# Patient Record
Sex: Female | Born: 1955 | Race: Black or African American | Hispanic: No | Smoking: Former smoker
Health system: Southern US, Community
[De-identification: ages and names within clinical notes are randomized; demographics above are authoritative.]

## PROBLEM LIST (undated history)

## (undated) DIAGNOSIS — K219 Gastro-esophageal reflux disease without esophagitis: Secondary | ICD-10-CM

## (undated) DIAGNOSIS — M1990 Unspecified osteoarthritis, unspecified site: Secondary | ICD-10-CM

## (undated) DIAGNOSIS — T7840XA Allergy, unspecified, initial encounter: Secondary | ICD-10-CM

## (undated) DIAGNOSIS — I10 Essential (primary) hypertension: Secondary | ICD-10-CM

## (undated) HISTORY — DX: Unspecified osteoarthritis, unspecified site: M19.90

## (undated) HISTORY — DX: Allergy, unspecified, initial encounter: T78.40XA

## (undated) HISTORY — DX: Gastro-esophageal reflux disease without esophagitis: K21.9

## (undated) HISTORY — PX: DENTAL SURGERY: SHX609

---

## 1998-05-21 ENCOUNTER — Other Ambulatory Visit: Admission: RE | Admit: 1998-05-21 | Discharge: 1998-05-21 | Payer: Self-pay | Admitting: Obstetrics and Gynecology

## 1999-05-28 ENCOUNTER — Other Ambulatory Visit: Admission: RE | Admit: 1999-05-28 | Discharge: 1999-05-28 | Payer: Self-pay | Admitting: Obstetrics and Gynecology

## 2000-06-08 ENCOUNTER — Other Ambulatory Visit: Admission: RE | Admit: 2000-06-08 | Discharge: 2000-06-08 | Payer: Self-pay | Admitting: Obstetrics and Gynecology

## 2001-06-27 ENCOUNTER — Other Ambulatory Visit: Admission: RE | Admit: 2001-06-27 | Discharge: 2001-06-27 | Payer: Self-pay | Admitting: Obstetrics and Gynecology

## 2002-07-20 ENCOUNTER — Other Ambulatory Visit: Admission: RE | Admit: 2002-07-20 | Discharge: 2002-07-20 | Payer: Self-pay | Admitting: Family Medicine

## 2003-08-02 ENCOUNTER — Other Ambulatory Visit: Admission: RE | Admit: 2003-08-02 | Discharge: 2003-08-02 | Payer: Self-pay | Admitting: Obstetrics and Gynecology

## 2004-08-28 ENCOUNTER — Other Ambulatory Visit: Admission: RE | Admit: 2004-08-28 | Discharge: 2004-08-28 | Payer: Self-pay | Admitting: Obstetrics and Gynecology

## 2005-09-02 ENCOUNTER — Other Ambulatory Visit: Admission: RE | Admit: 2005-09-02 | Discharge: 2005-09-02 | Payer: Self-pay | Admitting: Obstetrics and Gynecology

## 2008-07-17 ENCOUNTER — Ambulatory Visit (HOSPITAL_COMMUNITY): Admission: RE | Admit: 2008-07-17 | Discharge: 2008-07-17 | Payer: Self-pay | Admitting: Obstetrics and Gynecology

## 2009-01-20 ENCOUNTER — Emergency Department (HOSPITAL_COMMUNITY): Admission: EM | Admit: 2009-01-20 | Discharge: 2009-01-20 | Payer: Self-pay | Admitting: Emergency Medicine

## 2012-12-21 ENCOUNTER — Encounter (HOSPITAL_COMMUNITY): Payer: Self-pay | Admitting: Emergency Medicine

## 2012-12-21 ENCOUNTER — Emergency Department (HOSPITAL_COMMUNITY)
Admission: EM | Admit: 2012-12-21 | Discharge: 2012-12-21 | Disposition: A | Discharge status: Home / Self Care | Payer: Self-pay | Attending: Emergency Medicine | Admitting: Emergency Medicine

## 2012-12-21 DIAGNOSIS — S81009A Unspecified open wound, unspecified knee, initial encounter: Secondary | ICD-10-CM | POA: Insufficient documentation

## 2012-12-21 DIAGNOSIS — S81812A Laceration without foreign body, left lower leg, initial encounter: Secondary | ICD-10-CM

## 2012-12-21 DIAGNOSIS — Y9389 Activity, other specified: Secondary | ICD-10-CM | POA: Insufficient documentation

## 2012-12-21 DIAGNOSIS — Z23 Encounter for immunization: Secondary | ICD-10-CM | POA: Insufficient documentation

## 2012-12-21 DIAGNOSIS — W268XXA Contact with other sharp object(s), not elsewhere classified, initial encounter: Secondary | ICD-10-CM | POA: Insufficient documentation

## 2012-12-21 DIAGNOSIS — Y929 Unspecified place or not applicable: Secondary | ICD-10-CM | POA: Insufficient documentation

## 2012-12-21 DIAGNOSIS — I10 Essential (primary) hypertension: Secondary | ICD-10-CM | POA: Insufficient documentation

## 2012-12-21 DIAGNOSIS — F17200 Nicotine dependence, unspecified, uncomplicated: Secondary | ICD-10-CM | POA: Insufficient documentation

## 2012-12-21 DIAGNOSIS — T148XXA Other injury of unspecified body region, initial encounter: Secondary | ICD-10-CM | POA: Insufficient documentation

## 2012-12-21 HISTORY — DX: Essential (primary) hypertension: I10

## 2012-12-21 MED ORDER — IBUPROFEN 800 MG PO TABS: 800.0000 mg | ORAL_TABLET | Freq: Three times a day (TID) | ORAL | Status: DC

## 2012-12-21 MED ORDER — TETANUS-DIPHTH-ACELL PERTUSSIS 5-2.5-18.5 LF-MCG/0.5 IM SUSP
0.5000 mL | Freq: Once | INTRAMUSCULAR | Status: AC
  Administered 2012-12-21: 0.5 mL via INTRAMUSCULAR
  Filled 2012-12-21: qty 0.5

## 2012-12-21 NOTE — ED Notes (Signed)
Per pt, broke glass yesterday, was taking garbage out when trash bag rubbed up against left thigh and glass cut her

## 2012-12-21 NOTE — Progress Notes (Signed)
[facility] has seen patient and provided her with a oc application. °

## 2012-12-22 NOTE — ED Provider Notes (Signed)
History    CSN: [id number] Arrival date & time 12/21/12  0907  First MD Initiated Contact with Patient 12/21/12 [phone]     Chief Complaint  Patient presents with  . Extremity Laceration   (Consider location/radiation/quality/duration/timing/severity/associated sxs/prior Treatment) Patient is a 57 y.o. female presenting with skin laceration. The history is provided by the patient.  Laceration Location: left anterior thigh. Length (cm):  1.5 cm Quality: straight   Bleeding: controlled   Time since incident:  18 hours Laceration mechanism:  Broken glass Pain details:    Quality:  Dull   Severity:  Mild   Progression:  Improving Foreign body present:  No foreign bodies Ineffective treatments: Patient has been cleaning laceration with alcohol. Tetanus status:  Out of date  Past Medical History  Diagnosis Date  . Hypertension    History reviewed. No pertinent past surgical history. No family history on file. History  Substance Use Topics  . Smoking status: Current Every Day Smoker  . Smokeless tobacco: Not on file  . Alcohol Use: Yes   OB History   Grav Para Term Preterm Abortions TAB SAB Ect Mult Living                 Review of Systems  Skin: Positive for wound.  All other systems reviewed and are negative.    Allergies  Review of patient's allergies indicates no known allergies.  Home Medications   Current Outpatient Rx  Name  Route  Sig  Dispense  Refill  . ALPRAZolam (XANAX) 0.5 MG tablet   Oral   Take 0.5 mg by mouth every 8 (eight) hours as needed for sleep.         . Aspirin-Acetaminophen-Caffeine (GOODY HEADACHE PO)   Oral   Take 1 packet by mouth daily as needed.         Marland Kitchen ibuprofen (ADVIL,MOTRIN) 200 MG tablet   Oral   Take 400 mg by mouth every 6 (six) hours as needed for pain.         Marland Kitchen lisinopril-hydrochlorothiazide (PRINZIDE,ZESTORETIC) 20-12.5 MG per tablet   Oral   Take 1 tablet by mouth daily.         Marland Kitchen ibuprofen  (ADVIL,MOTRIN) 800 MG tablet   Oral   Take 1 tablet (800 mg total) by mouth 3 (three) times daily.   21 tablet   0    BP 154/95  Pulse 87  Temp(Src) 98.5 F (36.9 C) (Oral)  Resp 16  SpO2 100% Physical Exam  Nursing note and vitals reviewed. Constitutional: She appears well-developed and well-nourished.  HENT:  Head: Normocephalic and atraumatic.  Cardiovascular: Normal rate, regular rhythm and normal heart sounds.   Pulmonary/Chest: Effort normal and breath sounds normal.  Neurological: She is alert.  Skin: Skin is warm and dry.  1.5 cm superficial laceration of the anterior left thigh.  No active bleeding.  No drainage.  No surrounding edema or erythema.  Psychiatric: She has a normal mood and affect.    ED Course  Procedures (including critical care time) Labs Reviewed - No data to display No results found. 1. Laceration of left lower extremity, initial encounter   2. Muscle strain     MDM  Patient presenting with a laceration of the left upper thigh.  Laceration is 18 hours old and therefore, was not repaired in the ED with sutures or Dermabond.  Laceration superficial and is not bleeding.  No signs of infection at this time.  Tetanus updated.  Patient  discharged home.  Pascal Lux Crandon, PA-C 12/22/12 2352

## 2012-12-23 NOTE — ED Provider Notes (Signed)
Medical screening examination/treatment/procedure(s) were performed by non-physician practitioner and as supervising physician I was immediately available for consultation/collaboration.   Jahaziel Francois L Somaly Marteney, MD 12/23/12 0850 

## 2014-03-13 ENCOUNTER — Emergency Department (HOSPITAL_COMMUNITY)
Admission: EM | Admit: 2014-03-13 | Discharge: 2014-03-13 | Disposition: A | Discharge status: Home / Self Care | Payer: Self-pay | Attending: Emergency Medicine | Admitting: Emergency Medicine

## 2014-03-13 ENCOUNTER — Emergency Department (HOSPITAL_COMMUNITY): Payer: Self-pay

## 2014-03-13 ENCOUNTER — Encounter (HOSPITAL_COMMUNITY): Payer: Self-pay | Admitting: Emergency Medicine

## 2014-03-13 DIAGNOSIS — Z791 Long term (current) use of non-steroidal anti-inflammatories (NSAID): Secondary | ICD-10-CM | POA: Insufficient documentation

## 2014-03-13 DIAGNOSIS — M1610 Unilateral primary osteoarthritis, unspecified hip: Secondary | ICD-10-CM | POA: Insufficient documentation

## 2014-03-13 DIAGNOSIS — Z79899 Other long term (current) drug therapy: Secondary | ICD-10-CM | POA: Insufficient documentation

## 2014-03-13 DIAGNOSIS — F17200 Nicotine dependence, unspecified, uncomplicated: Secondary | ICD-10-CM | POA: Insufficient documentation

## 2014-03-13 DIAGNOSIS — M1611 Unilateral primary osteoarthritis, right hip: Secondary | ICD-10-CM

## 2014-03-13 DIAGNOSIS — I10 Essential (primary) hypertension: Secondary | ICD-10-CM | POA: Insufficient documentation

## 2014-03-13 DIAGNOSIS — M79609 Pain in unspecified limb: Secondary | ICD-10-CM | POA: Insufficient documentation

## 2014-03-13 MED ORDER — TRAMADOL HCL 50 MG PO TABS: 50.0000 mg | ORAL_TABLET | Freq: Two times a day (BID) | ORAL | Status: DC

## 2014-03-13 MED ORDER — MELOXICAM 15 MG PO TABS: 15.0000 mg | ORAL_TABLET | Freq: Every day | ORAL | Status: DC

## 2014-03-13 NOTE — Discharge Instructions (Signed)
1. Medications: mobic, tramadol, usual home medications 2. Treatment: rest, drink plenty of fluids,  3. Follow Up: Please follow-up with the [place] [facility] for further evaluation of your hip pain    Arthritis, Nonspecific Arthritis is inflammation of a joint. This usually means pain, redness, warmth or swelling are present. One or more joints may be involved. There are a number of types of arthritis. Your caregiver may not be able to tell what type of arthritis you have right away. CAUSES  The most common cause of arthritis is the wear and tear on the joint (osteoarthritis). This causes damage to the cartilage, which can break down over time. The knees, hips, back and neck are most often affected by this type of arthritis. Other types of arthritis and common causes of joint pain include:  Sprains and other injuries near the joint. Sometimes minor sprains and injuries cause pain and swelling that develop hours later.  Rheumatoid arthritis. This affects hands, feet and knees. It usually affects both sides of your body at the same time. It is often associated with chronic ailments, fever, weight loss and general weakness.  Crystal arthritis. Gout and pseudo gout can cause occasional acute severe pain, redness and swelling in the foot, ankle, or knee.  Infectious arthritis. Bacteria can get into a joint through a break in overlying skin. This can cause infection of the joint. Bacteria and viruses can also spread through the blood and affect your joints.  Drug, infectious and allergy reactions. Sometimes joints can become mildly painful and slightly swollen with these types of illnesses. SYMPTOMS   Pain is the main symptom.  Your joint or joints can also be red, swollen and warm or hot to the touch.  You may have a fever with certain types of arthritis, or even feel overall ill.  The joint with arthritis will hurt with movement. Stiffness is present with some types of  arthritis. DIAGNOSIS  Your caregiver will suspect arthritis based on your description of your symptoms and on your exam. Testing may be needed to find the type of arthritis:  Blood and sometimes urine tests.  X-ray tests and sometimes CT or MRI scans.  Removal of fluid from the joint (arthrocentesis) is done to check for bacteria, crystals or other causes. Your caregiver (or a specialist) will numb the area over the joint with a local anesthetic, and use a needle to remove joint fluid for examination. This procedure is only minimally uncomfortable.  Even with these tests, your caregiver may not be able to tell what kind of arthritis you have. Consultation with a specialist (rheumatologist) may be helpful. TREATMENT  Your caregiver will discuss with you treatment specific to your type of arthritis. If the specific type cannot be determined, then the following general recommendations may apply. Treatment of severe joint pain includes:  Rest.  Elevation.  Anti-inflammatory medication (for example, ibuprofen) may be prescribed. Avoiding activities that cause increased pain.  Only take over-the-counter or prescription medicines for pain and discomfort as recommended by your caregiver.  Cold packs over an inflamed joint may be used for 10 to 15 minutes every hour. Hot packs sometimes feel better, but do not use overnight. Do not use hot packs if you are diabetic without your caregiver's permission.  A cortisone shot into arthritic joints may help reduce pain and swelling.  Any acute arthritis that gets worse over the next 1 to 2 days needs to be looked at to be sure there is no joint  infection. Long-term arthritis treatment involves modifying activities and lifestyle to reduce joint stress jarring. This can include weight loss. Also, exercise is needed to nourish the joint cartilage and remove waste. This helps keep the muscles around the joint strong. HOME CARE INSTRUCTIONS   Do not take  aspirin to relieve pain if gout is suspected. This elevates uric acid levels.  Only take over-the-counter or prescription medicines for pain, discomfort or fever as directed by your caregiver.  Rest the joint as much as possible.  If your joint is swollen, keep it elevated.  Use crutches if the painful joint is in your leg.  Drinking plenty of fluids may help for certain types of arthritis.  Follow your caregiver's dietary instructions.  Try low-impact exercise such as:  Swimming.  Water aerobics.  Biking.  Walking.  Morning stiffness is often relieved by a warm shower.  Put your joints through regular range-of-motion. SEEK MEDICAL CARE IF:   You do not feel better in 24 hours or are getting worse.  You have side effects to medications, or are not getting better with treatment. SEEK IMMEDIATE MEDICAL CARE IF:   You have a fever.  You develop severe joint pain, swelling or redness.  Many joints are involved and become painful and swollen.  There is severe back pain and/or leg weakness.  You have loss of bowel or bladder control. Document Released: 07/23/2004 Document Revised: 09/07/2011 Document Reviewed: 08/08/2008 [facility] Patient Information 2015 [place], [place]. This information is not intended to replace advice given to you by your health care provider. Make sure you discuss any questions you have with your health care provider.

## 2014-03-13 NOTE — ED Notes (Signed)
Pt c/o right leg pain x 1 year. Pt reports the pain starts in her groin and wraps around into her posterior thigh. Pt sts she hasn't been seen for this yet but decided to come in today because it just isn't getting better and feels like she is having some swelling to posterior thigh. Nad, skin warm and dry, resp e/u. Ambulatory with no difficulties.

## 2014-03-13 NOTE — ED Provider Notes (Signed)
Medical screening examination/treatment/procedure(s) were performed by non-physician practitioner and as supervising physician I was immediately available for consultation/collaboration.   EKG Interpretation None        Ezequiel Essex, MD 03/13/14 2322

## 2014-03-13 NOTE — ED Provider Notes (Signed)
CSN: [id number]     Arrival date & time 03/13/14  1417 History   First MD Initiated Contact with Patient 03/13/14 1506     Chief Complaint  Patient presents with  . Leg Pain     (Consider location/radiation/quality/duration/timing/severity/associated sxs/prior Treatment) Patient is a 58 y.o. female presenting with leg pain. The history is provided by the patient and medical records. No language interpreter was used.  Leg Pain Associated symptoms: back pain (right low back and buttock)   Associated symptoms: no fever and no neck pain     Martha Manning is a 58 y.o. female  with a hx of HTN presents to the Emergency Department complaining of intermittent, progressively worsening right leg pain onset January 2014.  Pt reports she works out and thought she had strained a muscle, but continued to work out.  Pt c/o right groin pain that radiates several inches down her thighs.  She reports pain is worse with walking and movement. Pt describes the pain as sharp, rated at a 6/10 when it comes.  Pt has been taking Goody Powders, ibuprofen without relief.  She took some of her mother's pain medication which did help her.  Pt denies fever, chills, chest pain, SOB, numbness, weakness, dizziness.  Pt denies exogenous estrogen, recent travel, fractures or surgeries; denies hx of DVT/PE.     Past Medical History  Diagnosis Date  . Hypertension    History reviewed. No pertinent past surgical history. No family history on file. History  Substance Use Topics  . Smoking status: Current Every Day Smoker -- 0.50 packs/day    Types: Cigarettes  . Smokeless tobacco: Not on file  . Alcohol Use: Yes   OB History   Grav Para Term Preterm Abortions TAB SAB Ect Mult Living                 Review of Systems  Constitutional: Negative for fever and chills.  Respiratory: Negative for chest tightness and shortness of breath.   Cardiovascular: Negative for chest pain, palpitations and leg swelling.   Gastrointestinal: Negative for nausea and vomiting.  Musculoskeletal: Positive for arthralgias ( right hip), back pain (right low back and buttock) and joint swelling (right hip). Negative for neck pain and neck stiffness.  Skin: Negative for wound.  Neurological: Negative for numbness.  Hematological: Negative for adenopathy. Does not bruise/bleed easily.  Psychiatric/Behavioral: The patient is not nervous/anxious.   All other systems reviewed and are negative.     Allergies  Review of patient's allergies indicates no known allergies.  Home Medications   Prior to Admission medications   Medication Sig Start Date End Date Taking? Authorizing Provider  Aspirin-Acetaminophen-Caffeine (GOODY HEADACHE PO) Take 1 packet by mouth daily as needed (headache).    Yes Historical Provider, MD  ibuprofen (ADVIL,MOTRIN) 200 MG tablet Take 600 mg by mouth every 6 (six) hours as needed for headache.    Yes Historical Provider, MD  lisinopril-hydrochlorothiazide (PRINZIDE,ZESTORETIC) 20-12.5 MG per tablet Take 1 tablet by mouth daily.   Yes Historical Provider, MD  Multiple Vitamin (MULTIVITAMIN WITH MINERALS) TABS tablet Take 1 tablet by mouth daily.   Yes Historical Provider, MD  meloxicam (MOBIC) 15 MG tablet Take 1 tablet (15 mg total) by mouth daily. 03/13/14   Jillaine Waren, PA-C  traMADol (ULTRAM) 50 MG tablet Take 1 tablet (50 mg total) by mouth 2 (two) times daily. As needed for pain 03/13/14   [facility] Lurleen Soltero, PA-C   BP 150/99  Pulse 93  Temp(Src) 98.2 F (36.8 C) (Oral)  Resp 18  Ht 4\' 11"  (1.499 m)  Wt 140 lb (63.504 kg)  BMI 28.26 kg/m2  SpO2 99% Physical Exam  Nursing note and vitals reviewed. Constitutional: She appears well-developed and well-nourished. No distress.  HENT:  Head: Normocephalic and atraumatic.  Eyes: Conjunctivae are normal.  Neck: Normal range of motion.  Cardiovascular: Normal rate, regular rhythm, normal heart sounds and intact distal pulses.    Capillary refill < 3 sec  Pulmonary/Chest: Effort normal and breath sounds normal.  Musculoskeletal: She exhibits tenderness ( right buttock). She exhibits no edema.  ROM: Full ROM of the right hip with mild pain; full ROM of the left hip without pain Full ROM of T-spine and L-spine without reproduction of the pain  Neurological: She is alert. She has normal strength. Coordination normal.  Reflex Scores:      Bicep reflexes are 2+ on the right side and 2+ on the left side.      Brachioradialis reflexes are 2+ on the right side and 2+ on the left side.      Patellar reflexes are 2+ on the right side and 2+ on the left side.      Achilles reflexes are 2+ on the right side and 2+ on the left side. Sensation intact to dull and sharp Strength 5/5 in the BLE including resisted flexion and extension, abduction and adduction of the hip, knee and ankle No gait disturbance  Skin: Skin is warm and dry. She is not diaphoretic. No erythema.  No tenting of the skin  Psychiatric: She has a normal mood and affect.    ED Course  Procedures (including critical care time) Labs Review Labs Reviewed - No data to display  Imaging Review Dg Hip Complete Right  03/13/2014   CLINICAL DATA:  Right buttock, hip, and thigh pain for 20 months. Hypertension.  EXAM: RIGHT HIP - COMPLETE 2+ VIEW  COMPARISON:  None.  FINDINGS: Severe craniocaudad loss of articular space in the right hip with associated acetabular and femoral head spurring. This is asymmetric, with the left hip appearing normal. Spurring along the right femoral neck medially.  IMPRESSION: 1. Severe asymmetric osteoarthritis of the right hip, possibly due to remote trauma. This has a "bone-on-bone" appearance and the patient may be a suitable candidate for right hip replacement. The left hip appears normal.   Electronically Signed   By: Sherryl Barters M.D.   On: 03/13/2014 16:05     EKG Interpretation None      MDM   Final diagnoses:  Primary  osteoarthritis of right hip   Martha Manning presents with right hip pain for 1.5 years.  Wells DVT score -2; vision without risk factors, she is low risk I do not believe that she has a DVT. Pt Hx and physical consistent with osteoarthritis of the hip.  Will obtain x-ray.  Patient X-Ray negative for obvious fracture or dislocation, but significant osteoarthritis is suspected.. Pt advised to follow up with orthopedics for further evaluation and discussion of treatment options. Conservative therapy recommended and discussed including anti-inflammatories. Will give tramadol for pain control. Patient will be dc home & is agreeable with above plan.  BP 150/99  Pulse 93  Temp(Src) 98.2 F (36.8 C) (Oral)  Resp 18  Ht 4\' 11"  (1.499 m)  Wt 140 lb (63.504 kg)  BMI 28.26 kg/m2  SpO2 99%    Abigail Butts, PA-C 03/13/14 1633

## 2014-03-13 NOTE — ED Notes (Signed)
Declined W/C at D/C and was escorted to lobby by RN. 

## 2014-03-14 ENCOUNTER — Ambulatory Visit: Payer: Self-pay | Attending: Internal Medicine | Admitting: Internal Medicine

## 2014-03-14 ENCOUNTER — Encounter: Payer: Self-pay | Admitting: Internal Medicine

## 2014-03-14 VITALS — BP 140/90 | HR 77 | Temp 98.4°F | Resp 16 | Wt 143.8 lb

## 2014-03-14 DIAGNOSIS — F17200 Nicotine dependence, unspecified, uncomplicated: Secondary | ICD-10-CM | POA: Insufficient documentation

## 2014-03-14 DIAGNOSIS — M1610 Unilateral primary osteoarthritis, unspecified hip: Secondary | ICD-10-CM | POA: Insufficient documentation

## 2014-03-14 DIAGNOSIS — Z7982 Long term (current) use of aspirin: Secondary | ICD-10-CM | POA: Insufficient documentation

## 2014-03-14 DIAGNOSIS — I10 Essential (primary) hypertension: Secondary | ICD-10-CM | POA: Insufficient documentation

## 2014-03-14 DIAGNOSIS — M169 Osteoarthritis of hip, unspecified: Secondary | ICD-10-CM

## 2014-03-14 DIAGNOSIS — M1611 Unilateral primary osteoarthritis, right hip: Secondary | ICD-10-CM | POA: Insufficient documentation

## 2014-03-14 DIAGNOSIS — Z1211 Encounter for screening for malignant neoplasm of colon: Secondary | ICD-10-CM

## 2014-03-14 DIAGNOSIS — Z139 Encounter for screening, unspecified: Secondary | ICD-10-CM

## 2014-03-14 LAB — COMPLETE METABOLIC PANEL WITH GFR
ALK PHOS: 74 U/L (ref 39–117)
ALT: 10 U/L (ref 0–35)
AST: 15 U/L (ref 0–37)
Albumin: 4.4 g/dL (ref 3.5–5.2)
BUN: 12 mg/dL (ref 6–23)
CALCIUM: 10.4 mg/dL (ref 8.4–10.5)
CHLORIDE: 103 meq/L (ref 96–112)
CO2: 27 mEq/L (ref 19–32)
CREATININE: 0.64 mg/dL (ref 0.50–1.10)
GFR, Est African American: >89 mL/min
GFR, Est Non African American: >89 mL/min
Glucose, Bld: 83 mg/dL (ref 70–99)
POTASSIUM: 4.2 meq/L (ref 3.5–5.3)
Sodium: 138 mEq/L (ref 135–145)
Total Bilirubin: 0.3 mg/dL (ref 0.2–1.2)
Total Protein: 7.1 g/dL (ref 6.0–8.3)

## 2014-03-14 LAB — CBC WITH DIFFERENTIAL/PLATELET
BASOS ABS: 0 10*3/uL (ref 0.0–0.1)
BASOS PCT: 0 % (ref 0–1)
EOS PCT: 2 % (ref 0–5)
Eosinophils Absolute: 0.1 10*3/uL (ref 0.0–0.7)
HCT: 37.1 % (ref 36.0–46.0)
Hemoglobin: 12.3 g/dL (ref 12.0–15.0)
LYMPHS PCT: 35 % (ref 12–46)
Lymphs Abs: 2.5 10*3/uL (ref 0.7–4.0)
MCH: 25.9 pg — ABNORMAL LOW (ref 26.0–34.0)
MCHC: 33.2 g/dL (ref 30.0–36.0)
MCV: 78.3 fL (ref 78.0–100.0)
Monocytes Absolute: 0.9 10*3/uL (ref 0.1–1.0)
Monocytes Relative: 12 % (ref 3–12)
NEUTROS ABS: 3.7 10*3/uL (ref 1.7–7.7)
Neutrophils Relative %: 51 % (ref 43–77)
PLATELETS: 353 10*3/uL (ref 150–400)
RBC: 4.74 MIL/uL (ref 3.87–5.11)
RDW: 16.6 % — AB (ref 11.5–15.5)
WBC: 7.2 10*3/uL (ref 4.0–10.5)

## 2014-03-14 LAB — LIPID PANEL
CHOL/HDL RATIO: 3.1 ratio
CHOLESTEROL: 181 mg/dL (ref 0–200)
HDL: 58 mg/dL (ref >39)
LDL Cholesterol: 108 mg/dL — ABNORMAL HIGH (ref 0–99)
TRIGLYCERIDES: 75 mg/dL (ref <150)
VLDL: 15 mg/dL (ref 0–40)

## 2014-03-14 MED ORDER — LISINOPRIL-HYDROCHLOROTHIAZIDE 20-12.5 MG PO TABS: 1.0000 | ORAL_TABLET | Freq: Every day | ORAL | Status: DC

## 2014-03-14 NOTE — Progress Notes (Signed)
Patient here to establish care Was seen yesterday in the  ED for right side hip pain Was diagnosed with arthritis Today presents with elevated blood pressure Stated took her blood pressure medication this am at 8:00

## 2014-03-14 NOTE — Progress Notes (Signed)
Patient Demographics  Martha Manning, is a 58 y.o. female  IDP:[id number]  WER:[id number]  DOB - 1955-06-30  CC:  Chief Complaint  Patient presents with  . Establish Care       HPI: Martha Manning is a 58 y.o. female here today to establish medical care.Patient has history of hypertension, recently went to the emergency room with symptoms of right leg pain which is in chronic recently getting worse, EMR reviewed the pain starts from right groin radiates down to the thigh, patient had her x-ray done which reported severe asymmetric osteoarthritis of right hip. Patient was advised to osteoarthritis, she was prescribed tramadol for pain control. Today her blood pressure was elevated, repeat manual blood pressure is 140/90 as per patient she is compliant in taking her medications. Patient has No headache, No chest pain, No abdominal pain - No Nausea, No new weakness tingling or numbness, No Cough - SOB.  No Known Allergies Past Medical History  Diagnosis Date  . Hypertension   . Arthritis    Current Outpatient Prescriptions on File Prior to Visit  Medication Sig Dispense Refill  . Aspirin-Acetaminophen-Caffeine (GOODY HEADACHE PO) Take 1 packet by mouth daily as needed (headache).       Marland Kitchen ibuprofen (ADVIL,MOTRIN) 200 MG tablet Take 600 mg by mouth every 6 (six) hours as needed for headache.       . meloxicam (MOBIC) 15 MG tablet Take 1 tablet (15 mg total) by mouth daily.  30 tablet  0  . Multiple Vitamin (MULTIVITAMIN WITH MINERALS) TABS tablet Take 1 tablet by mouth daily.      . traMADol (ULTRAM) 50 MG tablet Take 1 tablet (50 mg total) by mouth 2 (two) times daily. As needed for pain  15 tablet  0   No current facility-administered medications on file prior to visit.   Family History  Problem Relation Age of Onset  . Hypertension Mother   . Stroke Mother   . Hypertension Sister    History   Social History  . Marital Status: Divorced    Spouse Name: N/A    Number  of Children: N/A  . Years of Education: N/A   Occupational History  . Not on file.   Social History Main Topics  . Smoking status: Current Every Day Smoker -- 0.50 packs/day for 40 years    Types: Cigarettes  . Smokeless tobacco: Not on file  . Alcohol Use: Yes  . Drug Use: No  . Sexual Activity: Not on file   Other Topics Concern  . Not on file   Social History Narrative  . No narrative on file    Review of Systems: Constitutional: Negative for fever, chills, diaphoresis, activity change, appetite change and fatigue. HENT: Negative for ear pain, nosebleeds, congestion, facial swelling, rhinorrhea, neck pain, neck stiffness and ear discharge.  Eyes: Negative for pain, discharge, redness, itching and visual disturbance. Respiratory: Negative for cough, choking, chest tightness, shortness of breath, wheezing and stridor.  Cardiovascular: Negative for chest pain, palpitations and leg swelling. Gastrointestinal: Negative for abdominal distention. Genitourinary: Negative for dysuria, urgency, frequency, hematuria, flank pain, decreased urine volume, difficulty urinating and dyspareunia.  Musculoskeletal: Negative for back pain, joint swelling, arthralgia and gait problem. Neurological: Negative for dizziness, tremors, seizures, syncope, facial asymmetry, speech difficulty, weakness, light-headedness, numbness and headaches.  Hematological: Negative for adenopathy. Does not bruise/bleed easily. Psychiatric/Behavioral: Negative for hallucinations, behavioral problems, confusion, dysphoric mood, decreased concentration and agitation.    Objective:   Filed  Vitals:   03/14/14 1058  BP: 140/90  Pulse:   Temp:   Resp:     Physical Exam: Constitutional: Patient appears well-developed and well-nourished. No distress. HENT: Normocephalic, atraumatic, External right and left ear normal. Oropharynx is clear and moist.  Eyes: Conjunctivae and EOM are normal. PERRLA, no scleral  icterus. Neck: Normal ROM. Neck supple. No JVD. No tracheal deviation. No thyromegaly. CVS: RRR, S1/S2 +, no murmurs, no gallops, no carotid bruit.  Pulmonary: Effort and breath sounds normal, no stridor, rhonchi, wheezes, rales.  Abdominal: Soft. BS +, no distension, tenderness, rebound or guarding.  Musculoskeletal: Normal range of motion. No edema and no tenderness.  Neuro: Alert. Normal reflexes, muscle tone coordination. No cranial nerve deficit. Skin: Skin is warm and dry. No rash noted. Not diaphoretic. No erythema. No pallor. Psychiatric: Normal mood and affect. Behavior, judgment, thought content normal.  No results found for this basename: WBC, HGB, HCT, MCV, PLT   No results found for this basename: CREATININE, BUN, NA, K, CL, CO2    No results found for this basename: HGBA1C   Lipid Panel  No results found for this basename: chol, trig, hdl, cholhdl, vldl, ldlcalc       Assessment and plan:   1. Essential hypertension, benign Blood pressure is borderline elevated, advise patient for DASH diet will repeat blood chemistry continued current meds. - COMPLETE METABOLIC PANEL WITH GFR - lisinopril-hydrochlorothiazide (PRINZIDE,ZESTORETIC) 20-12.5 MG per tablet; Take 1 tablet by mouth daily.  Dispense: 30 tablet; Refill: 3  2. Osteoarthritis of right hip, unspecified osteoarthritis type Patient asked tramadol when necessary for pain - Ambulatory referral to Orthopedic Surgery  3. Screening Ordered baseline blood work. - CBC with Differential - COMPLETE METABOLIC PANEL WITH GFR - TSH - Lipid panel - Vit D  25 hydroxy (rtn osteoporosis monitoring) - MM DIGITAL SCREENING BILATERAL; Future - Hemoglobin A1c - Ambulatory referral to Gynecology  4. Special screening for malignant neoplasms, colon  - Ambulatory referral to Gastroenterology  5. Smoking Advised patient quit smoking.     Health Maintenance -Colonoscopy: referred to GI -Pap Smear: referred to  GYN -Mammogram: ordered  Patient declines flu shot  Return in about 3 months (around 06/13/2014) for hypertension.   Lorayne Marek, MD

## 2014-03-14 NOTE — Patient Instructions (Signed)
DASH Eating Plan °DASH stands for "Dietary Approaches to Stop Hypertension." The DASH eating plan is a healthy eating plan that has been shown to reduce high blood pressure (hypertension). Additional health benefits may include reducing the risk of type 2 diabetes mellitus, heart disease, and stroke. The DASH eating plan may also help with weight loss. °WHAT DO I NEED TO KNOW ABOUT THE DASH EATING PLAN? °For the DASH eating plan, you will follow these general guidelines: °· Choose foods with a percent daily value for sodium of less than 5% (as listed on the food label). °· Use salt-free seasonings or herbs instead of table salt or sea salt. °· Check with your health care provider or pharmacist before using salt substitutes. °· Eat lower-sodium products, often labeled as "lower sodium" or "no salt added." °· Eat fresh foods. °· Eat more vegetables, fruits, and low-fat dairy products. °· Choose whole grains. Look for the word "whole" as the first word in the ingredient list. °· Choose fish and skinless chicken or turkey more often than red meat. Limit fish, poultry, and meat to 6 oz (170 g) each day. °· Limit sweets, desserts, sugars, and sugary drinks. °· Choose heart-healthy fats. °· Limit cheese to 1 oz (28 g) per day. °· Eat more home-cooked food and less restaurant, buffet, and fast food. °· Limit fried foods. °· Cook foods using methods other than frying. °· Limit canned vegetables. If you do use them, rinse them well to decrease the sodium. °· When eating at a restaurant, ask that your food be prepared with less salt, or no salt if possible. °WHAT FOODS CAN I EAT? °Seek help from a dietitian for individual calorie needs. °Grains °Whole grain or whole wheat bread. Brown rice. Whole grain or whole wheat pasta. Quinoa, bulgur, and whole grain cereals. Low-sodium cereals. Corn or whole wheat flour tortillas. Whole grain cornbread. Whole grain crackers. Low-sodium crackers. °Vegetables °Fresh or frozen vegetables  (raw, steamed, roasted, or grilled). Low-sodium or reduced-sodium tomato and vegetable juices. Low-sodium or reduced-sodium tomato sauce and paste. Low-sodium or reduced-sodium canned vegetables.  °Fruits °All fresh, canned (in natural juice), or frozen fruits. °Meat and Other Protein Products °Ground beef (85% or leaner), grass-fed beef, or beef trimmed of fat. Skinless chicken or turkey. Ground chicken or turkey. Pork trimmed of fat. All fish and seafood. Eggs. Dried beans, peas, or lentils. Unsalted nuts and seeds. Unsalted canned beans. °Dairy °Low-fat dairy products, such as skim or 1% milk, 2% or reduced-fat cheeses, low-fat ricotta or cottage cheese, or plain low-fat yogurt. Low-sodium or reduced-sodium cheeses. °Fats and Oils °Tub margarines without trans fats. Light or reduced-fat mayonnaise and salad dressings (reduced sodium). Avocado. Safflower, olive, or canola oils. Natural peanut or almond butter. °Other °Unsalted popcorn and pretzels. °The items listed above may not be a complete list of recommended foods or beverages. Contact your dietitian for more options. °WHAT FOODS ARE NOT RECOMMENDED? °Grains °White bread. White pasta. White rice. Refined cornbread. Bagels and croissants. Crackers that contain trans fat. °Vegetables °Creamed or fried vegetables. Vegetables in a cheese sauce. Regular canned vegetables. Regular canned tomato sauce and paste. Regular tomato and vegetable juices. °Fruits °Dried fruits. Canned fruit in light or heavy syrup. Fruit juice. °Meat and Other Protein Products °Fatty cuts of meat. Ribs, chicken wings, bacon, sausage, bologna, salami, chitterlings, fatback, hot dogs, bratwurst, and packaged luncheon meats. Salted nuts and seeds. Canned beans with salt. °Dairy °Whole or 2% milk, cream, half-and-half, and cream cheese. Whole-fat or sweetened yogurt. Full-fat   cheeses or blue cheese. Nondairy creamers and whipped toppings. Processed cheese, cheese spreads, or cheese  curds. °Condiments °Onion and garlic salt, seasoned salt, table salt, and sea salt. Canned and packaged gravies. Worcestershire sauce. Tartar sauce. Barbecue sauce. Teriyaki sauce. Soy sauce, including reduced sodium. Steak sauce. Fish sauce. Oyster sauce. Cocktail sauce. Horseradish. Ketchup and mustard. Meat flavorings and tenderizers. Bouillon cubes. Hot sauce. Tabasco sauce. Marinades. Taco seasonings. Relishes. °Fats and Oils °Butter, stick margarine, lard, shortening, ghee, and bacon fat. Coconut, palm kernel, or palm oils. Regular salad dressings. °Other °Pickles and olives. Salted popcorn and pretzels. °The items listed above may not be a complete list of foods and beverages to avoid. Contact your dietitian for more information. °WHERE CAN I FIND MORE INFORMATION? °National [employer], [employer], [employer]: www.nhlbi.nih.gov/health/health-topics/topics/dash/ °Document Released: 06/04/2011 Document Revised: 10/30/2013 Document Reviewed: 04/19/2013 °ExitCare® Patient Information ©2015 [employer]. This information is not intended to replace advice given to you by your health care provider. Make sure you discuss any questions you have with your health care provider. ° °

## 2014-03-15 LAB — TSH: TSH: 1.249 u[IU]/mL (ref 0.350–4.500)

## 2014-03-15 LAB — VITAMIN D 25 HYDROXY (VIT D DEFICIENCY, FRACTURES): Vit D, 25-Hydroxy: 59 ng/mL (ref 30–89)

## 2014-03-15 LAB — HEMOGLOBIN A1C
HEMOGLOBIN A1C: 5.9 % — AB (ref <5.7)
Mean Plasma Glucose: 123 mg/dL — ABNORMAL HIGH (ref <117)

## 2014-03-16 ENCOUNTER — Telehealth: Payer: Self-pay | Admitting: *Deleted

## 2014-03-16 NOTE — Telephone Encounter (Signed)
Pt is aware of her lab results.  

## 2014-03-16 NOTE — Telephone Encounter (Signed)
Message copied by Joan Mayans on Fri Mar 16, 2014 12:18 PM ------      Message from: Lorayne Marek      Created: Thu Mar 15, 2014 11:53 AM       Blood work reviewed noticed impaired fasting glucose, call and advise patient for low carbohydrate diet.       ------

## 2014-03-22 ENCOUNTER — Ambulatory Visit: Payer: Self-pay | Admitting: Sports Medicine

## 2014-04-05 ENCOUNTER — Ambulatory Visit: Payer: Self-pay

## 2014-04-30 ENCOUNTER — Ambulatory Visit: Payer: Self-pay

## 2014-05-03 ENCOUNTER — Ambulatory Visit: Payer: Self-pay

## 2014-05-21 ENCOUNTER — Ambulatory Visit: Payer: Self-pay

## 2014-06-07 ENCOUNTER — Ambulatory Visit: Payer: Self-pay

## 2014-06-08 ENCOUNTER — Ambulatory Visit: Payer: Self-pay | Attending: Internal Medicine

## 2014-06-14 ENCOUNTER — Other Ambulatory Visit (HOSPITAL_COMMUNITY)
Admission: RE | Admit: 2014-06-14 | Discharge: 2014-06-14 | Disposition: A | Discharge status: Home / Self Care | Payer: Self-pay | Source: Ambulatory Visit | Attending: Family Medicine | Admitting: Family Medicine

## 2014-06-14 ENCOUNTER — Ambulatory Visit (INDEPENDENT_AMBULATORY_CARE_PROVIDER_SITE_OTHER): Payer: Self-pay | Admitting: Family Medicine

## 2014-06-14 VITALS — BP 141/93 | HR 82 | Temp 98.0°F | Ht 59.0 in | Wt 141.9 lb

## 2014-06-14 DIAGNOSIS — Z124 Encounter for screening for malignant neoplasm of cervix: Secondary | ICD-10-CM

## 2014-06-14 DIAGNOSIS — Z113 Encounter for screening for infections with a predominantly sexual mode of transmission: Secondary | ICD-10-CM | POA: Insufficient documentation

## 2014-06-14 DIAGNOSIS — Z01419 Encounter for gynecological examination (general) (routine) without abnormal findings: Secondary | ICD-10-CM | POA: Insufficient documentation

## 2014-06-14 DIAGNOSIS — Z1151 Encounter for screening for human papillomavirus (HPV): Secondary | ICD-10-CM | POA: Insufficient documentation

## 2014-06-14 MED ORDER — NYSTATIN 100000 UNIT/GM EX CREA: 1.0000 "application " | TOPICAL_CREAM | Freq: Two times a day (BID) | CUTANEOUS | Status: DC

## 2014-06-14 NOTE — Addendum Note (Signed)
Addended by: [place] Pink on: 06/14/2014 01:32 PM   Modules accepted: Orders

## 2014-06-14 NOTE — Patient Instructions (Signed)
Pap Test A Pap test checks the cells on the surface of your cervix. Your doctor will look for cell changes that are not normal, an infection, or cancer. If the cells no longer look normal, it is called dysplasia. Dysplasia can turn into cancer. Regular Pap tests are important to stop cancer from developing. BEFORE THE PROCEDURE  Ask your doctor when to schedule your Pap test. Timing the test around your period may be important.  Do not douche or have sex (intercourse) for 24 hours before the test.  Do not put creams on your vagina or use tampons for 24 hours before the test.  Go pee (urinate) just before the test. PROCEDURE  You will lie on an exam table with your feet in stirrups.  A warm metal or plastic tool (speculum) will be put in your vagina to open it up.  Your doctor will use a small, plastic brush or wooden spatula to take cells from your cervix.  The cells will be put in a lab container.  The cells will be checked under a microscope to see if they are normal or not. AFTER THE PROCEDURE Get your test results. If they are abnormal, you may need more tests. Document Released: 07/18/2010 Document Revised: 09/07/2011 Document Reviewed: 06/11/2011 ExitCare Patient Information 2015 [employer]. This information is not intended to replace advice given to you by your health care provider. Make sure you discuss any questions you have with your health care provider.  

## 2014-06-14 NOTE — Progress Notes (Signed)
Patient ID: AUDRI KOZUB, female   DOB: 05-Sep-1955, 58 y.o.   MRN: [id number] Subjective:     PARRIE RASCO is a 58 y.o. woman who comes in today for a  pap smear only. Her most recent annual exam was on 1 yr ago but PAP was 3 yrs ago. Her most recent Pap smear was on 2012 and showed no abnormalities. Previous abnormal Pap smears: no. Contraception: postmenopausal  The following portions of the patient's history were reviewed and updated as appropriate: allergies, current medications, past family history, past medical history, past social history, past surgical history and problem list.  Review of Systems Pertinent items are noted in HPI.   Objective:    BP 141/93 mmHg  Pulse 82  Temp(Src) 98 F (36.7 C) (Oral)  Ht 4\' 11"  (1.499 m)  Wt 141 lb 14.4 oz (64.365 kg)  BMI 28.64 kg/m2 Pelvic Exam: cervix normal in appearance, external genitalia normal and vagina normal without discharge. Pap smear obtained.   Assessment:    Screening pap smear.   Plan:    Follow up in 3 year, or as indicated by Pap results.

## 2014-06-15 LAB — CERVICOVAGINAL ANCILLARY ONLY
Chlamydia: NEGATIVE
NEISSERIA GONORRHEA: NEGATIVE

## 2014-06-15 LAB — CYTOLOGY - PAP

## 2014-06-18 ENCOUNTER — Encounter: Payer: Self-pay | Admitting: Family Medicine

## 2014-06-19 ENCOUNTER — Telehealth: Payer: Self-pay | Admitting: Family Medicine

## 2014-06-19 NOTE — Telephone Encounter (Signed)
PAP result discussed with patient, transition zone not seen due to cervical atrophy which is normal postmenopausal. PAP otherwise normal. Repeat in 3 yrs.

## 2014-07-05 ENCOUNTER — Telehealth: Payer: Self-pay | Admitting: Internal Medicine

## 2014-07-12 ENCOUNTER — Telehealth: Payer: Self-pay | Admitting: Internal Medicine

## 2014-07-12 ENCOUNTER — Telehealth: Payer: Self-pay | Admitting: *Deleted

## 2014-07-12 ENCOUNTER — Ambulatory Visit (HOSPITAL_COMMUNITY)
Admission: RE | Admit: 2014-07-12 | Discharge: 2014-07-12 | Disposition: A | Discharge status: Home / Self Care | Payer: Self-pay | Source: Ambulatory Visit | Attending: Internal Medicine | Admitting: Internal Medicine

## 2014-07-12 DIAGNOSIS — Z139 Encounter for screening, unspecified: Secondary | ICD-10-CM

## 2014-07-12 DIAGNOSIS — Z1231 Encounter for screening mammogram for malignant neoplasm of breast: Secondary | ICD-10-CM | POA: Insufficient documentation

## 2014-07-12 NOTE — Telephone Encounter (Signed)
Patient has come into the clinic today to request a medication refill for meloxicam (MOBIC) 15 MG tablet; Patient says the script helped her condition out a lot and would like to continue usage; please f/u with patient about this request; [facility] [address];

## 2014-07-16 ENCOUNTER — Other Ambulatory Visit: Payer: Self-pay | Admitting: *Deleted

## 2014-07-16 MED ORDER — MELOXICAM 15 MG PO TABS: 15.0000 mg | ORAL_TABLET | Freq: Every day | ORAL | Status: DC

## 2014-07-16 NOTE — Telephone Encounter (Signed)
Its okay patient can be given refill on the medication.Marland Kitchen

## 2014-08-23 ENCOUNTER — Telehealth: Payer: Self-pay | Admitting: *Deleted

## 2014-08-23 ENCOUNTER — Other Ambulatory Visit: Payer: Self-pay | Admitting: *Deleted

## 2014-08-23 DIAGNOSIS — I10 Essential (primary) hypertension: Secondary | ICD-10-CM

## 2014-08-23 MED ORDER — LISINOPRIL-HYDROCHLOROTHIAZIDE 20-12.5 MG PO TABS: 1.0000 | ORAL_TABLET | Freq: Every day | ORAL | Status: DC

## 2014-08-23 MED ORDER — MELOXICAM 15 MG PO TABS: 15.0000 mg | ORAL_TABLET | Freq: Every day | ORAL | Status: DC

## 2014-08-23 NOTE — Telephone Encounter (Signed)
Pt requesting refill Rx Mobic and Lisinopril Refills send to [facility] Pt advised to schedule F/U appointment with PCP for futures refills

## 2014-09-03 ENCOUNTER — Ambulatory Visit: Payer: Self-pay | Attending: Internal Medicine | Admitting: Internal Medicine

## 2014-09-03 ENCOUNTER — Encounter: Payer: Self-pay | Admitting: Internal Medicine

## 2014-09-03 VITALS — BP 140/90 | HR 82 | Temp 98.0°F | Resp 16 | Wt 145.6 lb

## 2014-09-03 DIAGNOSIS — R7301 Impaired fasting glucose: Secondary | ICD-10-CM | POA: Insufficient documentation

## 2014-09-03 DIAGNOSIS — Z720 Tobacco use: Secondary | ICD-10-CM | POA: Insufficient documentation

## 2014-09-03 DIAGNOSIS — I10 Essential (primary) hypertension: Secondary | ICD-10-CM | POA: Insufficient documentation

## 2014-09-03 DIAGNOSIS — F17200 Nicotine dependence, unspecified, uncomplicated: Secondary | ICD-10-CM

## 2014-09-03 DIAGNOSIS — M1611 Unilateral primary osteoarthritis, right hip: Secondary | ICD-10-CM | POA: Insufficient documentation

## 2014-09-03 LAB — COMPLETE METABOLIC PANEL WITH GFR
ALBUMIN: 4.1 g/dL (ref 3.5–5.2)
ALK PHOS: 75 U/L (ref 39–117)
ALT: 9 U/L (ref 0–35)
AST: 12 U/L (ref 0–37)
BILIRUBIN TOTAL: 0.5 mg/dL (ref 0.2–1.2)
BUN: 12 mg/dL (ref 6–23)
CO2: 28 mEq/L (ref 19–32)
Calcium: 10.1 mg/dL (ref 8.4–10.5)
Chloride: 101 mEq/L (ref 96–112)
Creat: 0.65 mg/dL (ref 0.50–1.10)
GFR, Est African American: >89 mL/min
GLUCOSE: 89 mg/dL (ref 70–99)
POTASSIUM: 4.2 meq/L (ref 3.5–5.3)
SODIUM: 138 meq/L (ref 135–145)
TOTAL PROTEIN: 6.7 g/dL (ref 6.0–8.3)

## 2014-09-03 MED ORDER — LISINOPRIL-HYDROCHLOROTHIAZIDE 20-12.5 MG PO TABS: 1.0000 | ORAL_TABLET | Freq: Every day | ORAL | Status: DC

## 2014-09-03 MED ORDER — MELOXICAM 15 MG PO TABS: 15.0000 mg | ORAL_TABLET | Freq: Every day | ORAL | Status: DC

## 2014-09-03 NOTE — Progress Notes (Signed)
Patient here for follow up on her HTN Presents in office with elevated blood pressure Patient requesting a referral to ortho

## 2014-09-03 NOTE — Progress Notes (Signed)
MRN: [id number] Name: Martha Manning  Sex: female Age: 59 y.o. DOB: 01/05/1956  Allergies: Review of patient's allergies indicates no known allergies.  Chief Complaint  Patient presents with  . Follow-up    HPI: Patient is 59 y.o. female who history of hypertension, osteoarthritis of right hip, comes today for followup, initially her blood pressure was elevated, repeat manual blood pressure is 140/90, she denies any headache dizziness chest and shortness of breath, she is requesting another referral to see orthopedics for her right hip osteoarthritis, she's requesting refill on her medications.patient is to smoke cigarettes, I have counseled patient to quit smoking.patient had a blood work done which was reviewed with the patient noticed impaired fasting glucose with hemoglobin A1c of 5.9%.  Past Medical History  Diagnosis Date  . Hypertension   . Arthritis     History reviewed. No pertinent past surgical history.    Medication List       This list is accurate as of: 09/03/14 12:51 PM.  Always use your most recent med list.               GOODY HEADACHE PO  Take 1 packet by mouth daily as needed (headache).     ibuprofen 200 MG tablet  Commonly known as:  ADVIL,MOTRIN  Take 600 mg by mouth every 6 (six) hours as needed for headache.     lisinopril-hydrochlorothiazide 20-12.5 MG per tablet  Commonly known as:  PRINZIDE,ZESTORETIC  Take 1 tablet by mouth daily.     meloxicam 15 MG tablet  Commonly known as:  MOBIC  Take 1 tablet (15 mg total) by mouth daily.     multivitamin with minerals Tabs tablet  Take 1 tablet by mouth daily.     nystatin cream  Commonly known as:  MYCOSTATIN  Apply 1 application topically 2 (two) times daily.     traMADol 50 MG tablet  Commonly known as:  ULTRAM  Take 1 tablet (50 mg total) by mouth 2 (two) times daily. As needed for pain        Meds ordered this encounter  Medications  . lisinopril-hydrochlorothiazide  (PRINZIDE,ZESTORETIC) 20-12.5 MG per tablet    Sig: Take 1 tablet by mouth daily.    Dispense:  30 tablet    Refill:  3  . meloxicam (MOBIC) 15 MG tablet    Sig: Take 1 tablet (15 mg total) by mouth daily.    Dispense:  30 tablet    Refill:  2    Immunization History  Administered Date(s) Administered  . Tdap 12/21/2012    Family History  Problem Relation Age of Onset  . Hypertension Mother   . Stroke Mother   . Hypertension Sister     History  Substance Use Topics  . Smoking status: Current Every Day Smoker -- 0.50 packs/day for 40 years    Types: Cigarettes  . Smokeless tobacco: Not on file  . Alcohol Use: Yes    Review of Systems   As noted in HPI  Filed Vitals:   09/03/14 1135  BP: 140/90  Pulse:   Temp:   Resp:     Physical Exam  Physical Exam  Constitutional: No distress.  Eyes: EOM are normal. Pupils are equal, round, and reactive to light.  Cardiovascular: Normal rate and regular rhythm.   Pulmonary/Chest: Breath sounds normal. She has no wheezes. She has no rales.  Musculoskeletal: She exhibits no edema.    CBC    Component Value Date/Time  WBC 7.2 03/14/2014 1102   RBC 4.74 03/14/2014 1102   HGB 12.3 03/14/2014 1102   HCT 37.1 03/14/2014 1102   PLT 353 03/14/2014 1102   MCV 78.3 03/14/2014 1102   LYMPHSABS 2.5 03/14/2014 1102   MONOABS 0.9 03/14/2014 1102   EOSABS 0.1 03/14/2014 1102   BASOSABS 0.0 03/14/2014 1102    CMP     Component Value Date/Time   NA 138 03/14/2014 1102   K 4.2 03/14/2014 1102   CL 103 03/14/2014 1102   CO2 27 03/14/2014 1102   GLUCOSE 83 03/14/2014 1102   BUN 12 03/14/2014 1102   CREATININE 0.64 03/14/2014 1102   CALCIUM 10.4 03/14/2014 1102   PROT 7.1 03/14/2014 1102   ALBUMIN 4.4 03/14/2014 1102   AST 15 03/14/2014 1102   ALT 10 03/14/2014 1102   ALKPHOS 74 03/14/2014 1102   BILITOT 0.3 03/14/2014 1102   GFRNONAA >89 03/14/2014 1102   GFRAA >89 03/14/2014 1102    Lab Results  Component Value  Date/Time   CHOL 181 03/14/2014 11:02 AM    No components found for: HGA1C  Lab Results  Component Value Date/Time   AST 15 03/14/2014 11:02 AM    Assessment and Plan  Essential hypertension, benign - Plan:blood pressure is borderline elevated, I have advised patient for DASH diet, continue with  lisinopril-hydrochlorothiazide (PRINZIDE,ZESTORETIC) 20-12.5 MG per tablet, COMPLETE METABOLIC PANEL WITH GFR  Osteoarthritis of right hip, unspecified osteoarthritis type - Plan: Ambulatory referral to Orthopedic Surgery, meloxicam (MOBIC) 15 MG tablet  Smoking Counseled patient to quit smoking.  IFG (impaired fasting glucose) - Plan:advised patient for low carbohydrate. COMPLETE METABOLIC PANEL WITH GFR    Return in about 3 months (around 12/04/2014) for hypertension.   This note has been created with Surveyor, quantity. Any transcriptional errors are unintentional.    Lorayne Marek, MD

## 2014-09-03 NOTE — Patient Instructions (Addendum)
DASH Eating Plan DASH stands for "Dietary Approaches to Stop Hypertension." The DASH eating plan is a healthy eating plan that has been shown to reduce high blood pressure (hypertension). Additional health benefits may include reducing the risk of type 2 diabetes mellitus, heart disease, and stroke. The DASH eating plan may also help with weight loss. WHAT DO I NEED TO KNOW ABOUT THE DASH EATING PLAN? For the DASH eating plan, you will follow these general guidelines:  Choose foods with a percent daily value for sodium of less than 5% (as listed on the food label).  Use salt-free seasonings or herbs instead of table salt or sea salt.  Check with your health care provider or pharmacist before using salt substitutes.  Eat lower-sodium products, often labeled as "lower sodium" or "no salt added."  Eat fresh foods.  Eat more vegetables, fruits, and low-fat dairy products.  Choose whole grains. Look for the word "whole" as the first word in the ingredient list.  Choose fish and skinless chicken or turkey more often than red meat. Limit fish, poultry, and meat to 6 oz (170 g) each day.  Limit sweets, desserts, sugars, and sugary drinks.  Choose heart-healthy fats.  Limit cheese to 1 oz (28 g) per day.  Eat more home-cooked food and less restaurant, buffet, and fast food.  Limit fried foods.  Cook foods using methods other than frying.  Limit canned vegetables. If you do use them, rinse them well to decrease the sodium.  When eating at a restaurant, ask that your food be prepared with less salt, or no salt if possible. WHAT FOODS CAN I EAT? Seek help from a dietitian for individual calorie needs. Grains Whole grain or whole wheat bread. Brown rice. Whole grain or whole wheat pasta. Quinoa, bulgur, and whole grain cereals. Low-sodium cereals. Corn or whole wheat flour tortillas. Whole grain cornbread. Whole grain crackers. Low-sodium crackers. Vegetables Fresh or frozen vegetables  (raw, steamed, roasted, or grilled). Low-sodium or reduced-sodium tomato and vegetable juices. Low-sodium or reduced-sodium tomato sauce and paste. Low-sodium or reduced-sodium canned vegetables.  Fruits All fresh, canned (in natural juice), or frozen fruits. Meat and Other Protein Products Ground beef (85% or leaner), grass-fed beef, or beef trimmed of fat. Skinless chicken or turkey. Ground chicken or turkey. Pork trimmed of fat. All fish and seafood. Eggs. Dried beans, peas, or lentils. Unsalted nuts and seeds. Unsalted canned beans. Dairy Low-fat dairy products, such as skim or 1% milk, 2% or reduced-fat cheeses, low-fat ricotta or cottage cheese, or plain low-fat yogurt. Low-sodium or reduced-sodium cheeses. Fats and Oils Tub margarines without trans fats. Light or reduced-fat mayonnaise and salad dressings (reduced sodium). Avocado. Safflower, olive, or canola oils. Natural peanut or almond butter. Other Unsalted popcorn and pretzels. The items listed above may not be a complete list of recommended foods or beverages. Contact your dietitian for more options. WHAT FOODS ARE NOT RECOMMENDED? Grains White bread. White pasta. White rice. Refined cornbread. Bagels and croissants. Crackers that contain trans fat. Vegetables Creamed or fried vegetables. Vegetables in a cheese sauce. Regular canned vegetables. Regular canned tomato sauce and paste. Regular tomato and vegetable juices. Fruits Dried fruits. Canned fruit in light or heavy syrup. Fruit juice. Meat and Other Protein Products Fatty cuts of meat. Ribs, chicken wings, bacon, sausage, bologna, salami, chitterlings, fatback, hot dogs, bratwurst, and packaged luncheon meats. Salted nuts and seeds. Canned beans with salt. Dairy Whole or 2% milk, cream, half-and-half, and cream cheese. Whole-fat or sweetened yogurt. Full-fat   cheeses or blue cheese. Nondairy creamers and whipped toppings. Processed cheese, cheese spreads, or cheese  curds. Condiments Onion and garlic salt, seasoned salt, table salt, and sea salt. Canned and packaged gravies. Worcestershire sauce. Tartar sauce. Barbecue sauce. Teriyaki sauce. Soy sauce, including reduced sodium. Steak sauce. Fish sauce. Oyster sauce. Cocktail sauce. Horseradish. Ketchup and mustard. Meat flavorings and tenderizers. Bouillon cubes. Hot sauce. Tabasco sauce. Marinades. Taco seasonings. Relishes. Fats and Oils Butter, stick margarine, lard, shortening, ghee, and bacon fat. Coconut, palm kernel, or palm oils. Regular salad dressings. Other Pickles and olives. Salted popcorn and pretzels. The items listed above may not be a complete list of foods and beverages to avoid. Contact your dietitian for more information. WHERE CAN I FIND MORE INFORMATION? National Heart, Lung, and Blood Institute: www.nhlbi.nih.gov/health/health-topics/topics/dash/ Document Released: 06/04/2011 Document Revised: 10/30/2013 Document Reviewed: 04/19/2013 ExitCare Patient Information 2015 Exit[employer]. This information is not intended to replace advice given to you by your health care provider. Make sure you discuss any questions you have with your health care provider. Smoking Cessation Quitting smoking is important to your health and has many advantages. However, it is not always easy to quit since nicotine is a very addictive drug. Oftentimes, people try 3 times or more before being able to quit. This document explains the best ways for you to prepare to quit smoking. Quitting takes hard work and a lot of effort, but you can do it. ADVANTAGES OF QUITTING SMOKING  You will live longer, feel better, and live better.  Your body will feel the impact of quitting smoking almost immediately.  Within 20 minutes, blood pressure decreases. Your pulse returns to its normal level.  After 8 hours, carbon monoxide levels in the blood return to normal. Your oxygen level increases.  After 24 hours, the chance of  having a heart attack starts to decrease. Your breath, hair, and body stop smelling like smoke.  After 48 hours, damaged nerve endings begin to recover. Your sense of taste and smell improve.  After 72 hours, the body is virtually free of nicotine. Your bronchial tubes relax and breathing becomes easier.  After 2 to 12 weeks, lungs can hold more air. Exercise becomes easier and circulation improves.  The risk of having a heart attack, stroke, cancer, or lung disease is greatly reduced.  After 1 year, the risk of coronary heart disease is cut in half.  After 5 years, the risk of stroke falls to the same as a nonsmoker.  After 10 years, the risk of lung cancer is cut in half and the risk of other cancers decreases significantly.  After 15 years, the risk of coronary heart disease drops, usually to the level of a nonsmoker.  If you are pregnant, quitting smoking will improve your chances of having a healthy baby.  The people you live with, especially any children, will be healthier.  You will have extra money to spend on things other than cigarettes. QUESTIONS TO THINK ABOUT BEFORE ATTEMPTING TO QUIT You may want to talk about your answers with your health care provider.  Why do you want to quit?  If you tried to quit in the past, what helped and what did not?  What will be the most difficult situations for you after you quit? How will you plan to handle them?  Who can help you through the tough times? Your family? Friends? A health care provider?  What pleasures do you get from smoking? What ways can you still get pleasure   if you quit? Here are some questions to ask your health care provider:  How can you help me to be successful at quitting?  What medicine do you think would be best for me and how should I take it?  What should I do if I need more help?  What is smoking withdrawal like? How can I get information on withdrawal? GET READY  Set a quit date.  Change your  environment by getting rid of all cigarettes, ashtrays, matches, and lighters in your home, car, or work. Do not let people smoke in your home.  Review your past attempts to quit. Think about what worked and what did not. GET SUPPORT AND ENCOURAGEMENT You have a better chance of being successful if you have help. You can get support in many ways.  Tell your family, friends, and coworkers that you are going to quit and need their support. Ask them not to smoke around you.  Get individual, group, or telephone counseling and support. Programs are available at local hospitals and health centers. Call your local health department for information about programs in your area.  Spiritual beliefs and practices may help some smokers quit.  Download a "quit meter" on your computer to keep track of quit statistics, such as how long you have gone without smoking, cigarettes not smoked, and money saved.  Get a self-help book about quitting smoking and staying off tobacco. LEARN NEW SKILLS AND BEHAVIORS  Distract yourself from urges to smoke. Talk to someone, go for a walk, or occupy your time with a task.  Change your normal routine. Take a different route to work. Drink tea instead of coffee. Eat breakfast in a different place.  Reduce your stress. Take a hot bath, exercise, or read a book.  Plan something enjoyable to do every day. Reward yourself for not smoking.  Explore interactive web-based programs that specialize in helping you quit. GET MEDICINE AND USE IT CORRECTLY Medicines can help you stop smoking and decrease the urge to smoke. Combining medicine with the above behavioral methods and support can greatly increase your chances of successfully quitting smoking.  Nicotine replacement therapy helps deliver nicotine to your body without the negative effects and risks of smoking. Nicotine replacement therapy includes nicotine gum, lozenges, inhalers, nasal sprays, and skin patches. Some may be  available over-the-counter and others require a prescription.  Antidepressant medicine helps people abstain from smoking, but how this works is unknown. This medicine is available by prescription.  Nicotinic receptor partial agonist medicine simulates the effect of nicotine in your brain. This medicine is available by prescription. Ask your health care provider for advice about which medicines to use and how to use them based on your health history. Your health care provider will tell you what side effects to look out for if you choose to be on a medicine or therapy. Carefully read the information on the package. Do not use any other product containing nicotine while using a nicotine replacement product.  RELAPSE OR DIFFICULT SITUATIONS Most relapses occur within the first 3 months after quitting. Do not be discouraged if you start smoking again. Remember, most people try several times before finally quitting. You may have symptoms of withdrawal because your body is used to nicotine. You may crave cigarettes, be irritable, feel very hungry, cough often, get headaches, or have difficulty concentrating. The withdrawal symptoms are only temporary. They are strongest when you first quit, but they will go away within 10-14 days. To reduce the   chances of relapse, try to:  Avoid drinking alcohol. Drinking lowers your chances of successfully quitting.  Reduce the amount of caffeine you consume. Once you quit smoking, the amount of caffeine in your body increases and can give you symptoms, such as a rapid heartbeat, sweating, and anxiety.  Avoid smokers because they can make you want to smoke.  Do not let weight gain distract you. Many smokers will gain weight when they quit, usually less than 10 pounds. Eat a healthy diet and stay active. You can always lose the weight gained after you quit.  Find ways to improve your mood other than smoking. FOR MORE INFORMATION  www.smokefree.gov  Document Released:  06/09/2001 Document Revised: 10/30/2013 Document Reviewed: 09/24/2011 ExitCare Patient Information 2015 ExitCare[employer]. This information is not intended to replace advice given to you by your health care provider. Make sure you discuss any questions you have with your health care provider.  

## 2014-09-05 ENCOUNTER — Telehealth: Payer: Self-pay

## 2014-09-05 NOTE — Telephone Encounter (Signed)
Patient not available Left message on voice that results were all normal

## 2014-09-05 NOTE — Telephone Encounter (Signed)
-----   Message from Lorayne Marek, MD sent at 09/04/2014 12:32 PM EST ----- Call and let the Patient know that blood work is normal.

## 2015-02-07 ENCOUNTER — Other Ambulatory Visit: Payer: Self-pay | Admitting: Internal Medicine

## 2015-03-06 ENCOUNTER — Other Ambulatory Visit: Payer: Self-pay | Admitting: Internal Medicine

## 2015-03-11 ENCOUNTER — Telehealth: Payer: Self-pay | Admitting: Internal Medicine

## 2015-03-13 ENCOUNTER — Other Ambulatory Visit: Payer: Self-pay

## 2015-03-13 MED ORDER — LISINOPRIL-HYDROCHLOROTHIAZIDE 20-12.5 MG PO TABS: 1.0000 | ORAL_TABLET | Freq: Every day | ORAL | Status: DC

## 2015-04-08 ENCOUNTER — Other Ambulatory Visit: Payer: Self-pay | Admitting: Internal Medicine

## 2015-04-15 ENCOUNTER — Telehealth: Payer: Self-pay | Admitting: Internal Medicine

## 2015-04-15 NOTE — Telephone Encounter (Signed)
Patient came in requesting a medication refill for lisinopril-hydrochlorothiazide (PRINZIDE,ZESTORETIC Please follow up with patient.

## 2015-04-16 ENCOUNTER — Telehealth: Payer: Self-pay

## 2015-04-16 MED ORDER — LISINOPRIL-HYDROCHLOROTHIAZIDE 20-12.5 MG PO TABS: 1.0000 | ORAL_TABLET | Freq: Every day | ORAL | Status: DC

## 2015-04-16 NOTE — Telephone Encounter (Signed)
Returned patient phone call Patient not available Left message on voice mail to return our call A one month supply of lisinopril sent to pharmacy Future refills will require an appt

## 2015-04-16 NOTE — Telephone Encounter (Signed)
Patient returned phone call, please f/u with pt.    °

## 2015-04-17 NOTE — Telephone Encounter (Signed)
Called patient. Reach voicemail. Left message stating to return call at [phone].

## 2015-05-13 ENCOUNTER — Ambulatory Visit: Payer: Self-pay

## 2015-05-16 ENCOUNTER — Encounter: Payer: Self-pay | Admitting: Family Medicine

## 2015-05-16 ENCOUNTER — Ambulatory Visit: Payer: Self-pay | Attending: Family Medicine | Admitting: Family Medicine

## 2015-05-16 VITALS — BP 139/95 | HR 85 | Temp 98.5°F | Resp 16 | Ht 60.0 in | Wt 140.0 lb

## 2015-05-16 DIAGNOSIS — Z114 Encounter for screening for human immunodeficiency virus [HIV]: Secondary | ICD-10-CM

## 2015-05-16 DIAGNOSIS — M25551 Pain in right hip: Secondary | ICD-10-CM | POA: Insufficient documentation

## 2015-05-16 DIAGNOSIS — G8929 Other chronic pain: Secondary | ICD-10-CM | POA: Insufficient documentation

## 2015-05-16 DIAGNOSIS — I10 Essential (primary) hypertension: Secondary | ICD-10-CM | POA: Insufficient documentation

## 2015-05-16 DIAGNOSIS — Z1159 Encounter for screening for other viral diseases: Secondary | ICD-10-CM

## 2015-05-16 DIAGNOSIS — M1611 Unilateral primary osteoarthritis, right hip: Secondary | ICD-10-CM

## 2015-05-16 DIAGNOSIS — M67432 Ganglion, left wrist: Secondary | ICD-10-CM | POA: Insufficient documentation

## 2015-05-16 MED ORDER — TRAMADOL HCL 50 MG PO TABS
50.0000 mg | ORAL_TABLET | Freq: Two times a day (BID) | ORAL | Status: DC | PRN
Start: 2015-05-16 — End: 2015-07-22

## 2015-05-16 MED ORDER — MELOXICAM 15 MG PO TABS: 15.0000 mg | ORAL_TABLET | Freq: Every day | ORAL | Status: DC

## 2015-05-16 MED ORDER — LISINOPRIL-HYDROCHLOROTHIAZIDE 20-25 MG PO TABS: 1.0000 | ORAL_TABLET | Freq: Every day | ORAL | Status: DC

## 2015-05-16 MED ORDER — TRAMADOL HCL 50 MG PO TABS: 50.0000 mg | ORAL_TABLET | Freq: Two times a day (BID) | ORAL | Status: DC

## 2015-05-16 NOTE — Progress Notes (Signed)
F/U HTN Medicine refills  C/C Hip pain Pain scale #10  No suicide thought in the past two week

## 2015-05-16 NOTE — Progress Notes (Signed)
Patient ID: Martha Manning, female   DOB: Jul 16, 1955, 59 y.o.   MRN: [id number]   Subjective:  Patient ID: Martha Manning, female    DOB: 12/03/1955  Age: 59 y.o. MRN: [id number]  CC: Hypertension   HPI Martha Manning presents for    1. CHRONIC HYPERTENSION  Disease Monitoring  Blood pressure range: not checking   Chest pain: no   Dyspnea: no   Claudication: no   Medication compliance: yes  Medication Side Effects  Lightheadedness: no   Urinary frequency: no   Edema: no    2. Hip pain: R hip. Medial and anterior pain. Chronic. Worse with prolonged standing and lying on R side. No recent injury. Has been evaluated by ortho in past, Dr. Ninfa Linden. Reports that surgery has been recommended but she is unable to get surgery due to lack of health insurance.   Social History  Substance Use Topics  . Smoking status: Current Every Day Smoker -- 0.50 packs/day for 40 years    Types: Cigarettes  . Smokeless tobacco: Not on file  . Alcohol Use: Yes    Outpatient Prescriptions Prior to Visit  Medication Sig Dispense Refill  . Aspirin-Acetaminophen-Caffeine (GOODY HEADACHE PO) Take 1 packet by mouth daily as needed (headache).     Marland Kitchen ibuprofen (ADVIL,MOTRIN) 200 MG tablet Take 600 mg by mouth every 6 (six) hours as needed for headache.     . lisinopril-hydrochlorothiazide (PRINZIDE,ZESTORETIC) 20-12.5 MG tablet Take 1 tablet by mouth daily. 30 tablet 0  . meloxicam (MOBIC) 15 MG tablet Take 1 tablet (15 mg total) by mouth daily. 30 tablet 2  . Multiple Vitamin (MULTIVITAMIN WITH MINERALS) TABS tablet Take 1 tablet by mouth daily.    Marland Kitchen nystatin cream (MYCOSTATIN) Apply 1 application topically 2 (two) times daily. (Patient not taking: Reported on 05/16/2015) 30 g 0  . traMADol (ULTRAM) 50 MG tablet Take 1 tablet (50 mg total) by mouth 2 (two) times daily. As needed for pain (Patient not taking: Reported on 05/16/2015) 15 tablet 0   No facility-administered medications prior to  visit.    ROS Review of Systems  Constitutional: Negative for fever and chills.  Eyes: Negative for visual disturbance.  Respiratory: Negative for shortness of breath.   Cardiovascular: Negative for chest pain.  Gastrointestinal: Negative for abdominal pain and blood in stool.  Musculoskeletal: Positive for arthralgias (hip pain ). Negative for back pain.  Skin: Negative for rash.  Allergic/Immunologic: Negative for immunocompromised state.  Hematological: Negative for adenopathy. Does not bruise/bleed easily.  Psychiatric/Behavioral: Negative for suicidal ideas and dysphoric mood.    Objective:  BP 139/95 mmHg  Pulse 85  Temp(Src) 98.5 F (36.9 C) (Oral)  Resp 16  Ht 5' (1.524 m)  Wt 140 lb (63.504 kg)  BMI 27.34 kg/m2  SpO2 100%  BP/Weight 05/16/2015 09/03/2014 123456  Systolic BP XX123456 [id number]  Diastolic BP 95 90 93  Wt. (Lbs) 140 145.6 141.9  BMI 27.34 29.39 28.64   Physical Exam  Constitutional: She is oriented to person, place, and time. She appears well-developed and well-nourished. No distress.  HENT:  Head: Normocephalic and atraumatic.  Cardiovascular: Normal rate, regular rhythm, normal heart sounds and intact distal pulses.   Pulmonary/Chest: Effort normal and breath sounds normal.  Musculoskeletal: She exhibits no edema.       Right hip: She exhibits decreased range of motion and tenderness. She exhibits normal strength, no bony tenderness, no swelling, no crepitus, no deformity and no laceration.  Neurological:  She is alert and oriented to person, place, and time.  Skin: Skin is warm and dry. No rash noted.  Psychiatric: She has a normal mood and affect.     Assessment & Plan:   Problem List Items Addressed This Visit    Essential hypertension, benign - Primary (Chronic)    A: BP elevated Med: compliant P: Change prinzide from 20-12.5 to 20-25 daily       Relevant Medications   lisinopril-hydrochlorothiazide (PRINZIDE,ZESTORETIC) 20-25 MG  tablet   Other Relevant Orders   Basic Metabolic Panel   Ganglion of left wrist   Osteoarthritis of right hip (Chronic)    A: chronic R hip pain P: tramdol mobic BMP future order to check Cr       Relevant Medications   meloxicam (MOBIC) 15 MG tablet   traMADol (ULTRAM) 50 MG tablet    Other Visit Diagnoses    Screening for HIV (human immunodeficiency virus)        Relevant Orders    HIV antibody (with reflex)    Need for hepatitis C screening test        Relevant Orders    Hepatitis C antibody, reflex       No orders of the defined types were placed in this encounter.    Follow-up: No Follow-up on file.   Boykin Nearing MD

## 2015-05-16 NOTE — Assessment & Plan Note (Signed)
A: BP elevated Med: compliant P: Change prinzide from 20-12.5 to 20-25 daily

## 2015-05-16 NOTE — Assessment & Plan Note (Signed)
A: chronic R hip pain P: tramdol mobic BMP future order to check Cr

## 2015-05-16 NOTE — Patient Instructions (Addendum)
Martha Manning was seen today for hypertension.  Diagnoses and all orders for this visit:  Essential hypertension, benign -     lisinopril-hydrochlorothiazide (PRINZIDE,ZESTORETIC) 20-25 MG tablet; Take 1 tablet by mouth daily. -     Basic Metabolic Panel  Osteoarthritis of right hip, unspecified osteoarthritis type -     traMADol (ULTRAM) 50 MG tablet; Take 1 tablet (50 mg total) by mouth 2 (two) times daily. As needed for pain -     meloxicam (MOBIC) 15 MG tablet; Take 1 tablet (15 mg total) by mouth daily. -     Basic Metabolic Panel  Screening for HIV (human immunodeficiency virus) -     HIV antibody (with reflex)  Need for hepatitis C screening test -     Hepatitis C antibody, reflex  Ganglion of left wrist     Ganglion Cyst A ganglion cyst is a noncancerous, fluid-filled lump that occurs near joints or tendons. The ganglion cyst grows out of a joint or the lining of a tendon. It most often develops in the hand or wrist, but it can also develop in the shoulder, elbow, hip, knee, ankle, or foot. The round or oval ganglion cyst can be the size of a pea or larger than a grape. Increased activity may enlarge the size of the cyst because more fluid starts to build up.  CAUSES It is not known what causes a ganglion cyst to grow. However, it may be related to:  Inflammation or irritation around the joint.  An injury.  Repetitive movements or overuse.  Arthritis. RISK FACTORS Risk factors include:  Being a woman.  Being age 14-50. SIGNS AND SYMPTOMS Symptoms may include:   A lump. This most often appears on the hand or wrist, but it can occur in other areas of the body.  Tingling.  Pain.  Numbness.  Muscle weakness.  Weak grip.  Less movement in a joint. DIAGNOSIS Ganglion cysts are most often diagnosed based on a physical exam. Your health care provider will feel the lump and may shine a light alongside it. If it is a ganglion cyst, a light often shines through it.  Your health care provider may order an X-ray, ultrasound, or MRI to rule out other conditions. TREATMENT Ganglion cysts usually go away on their own without treatment. If pain or other symptoms are involved, treatment may be needed. Treatment is also needed if the ganglion cyst limits your movement or if it gets infected. Treatment may include:  Wearing a brace or splint on your wrist or finger.  Taking anti-inflammatory medicine.  Draining fluid from the lump with a needle (aspiration).  Injecting a steroid into the joint.  Surgery to remove the ganglion cyst. HOME CARE INSTRUCTIONS  Do not press on the ganglion cyst, poke it with a needle, or hit it.  Take medicines only as directed by your health care provider.  Wear your brace or splint as directed by your health care provider.  Watch your ganglion cyst for any changes.  Keep all follow-up visits as directed by your health care provider. This is important. SEEK MEDICAL CARE IF:  Your ganglion cyst becomes larger or more painful.  You have increased redness, red streaks, or swelling.  You have pus coming from the lump.  You have weakness or numbness in the affected area.  You have a fever or chills.   This information is not intended to replace advice given to you by your health care provider. Make sure you discuss any questions  you have with your health care provider.   Document Released: 06/12/2000 Document Revised: 07/06/2014 Document Reviewed: 11/28/2013 Elsevier Interactive Patient Education [employer].  F/u in 3 months for HTN, call for tramadol refill monthly as needed  Dr. Adrian Blackwater

## 2015-06-03 ENCOUNTER — Ambulatory Visit: Payer: Self-pay

## 2015-07-22 ENCOUNTER — Telehealth: Payer: Self-pay | Admitting: *Deleted

## 2015-07-22 ENCOUNTER — Ambulatory Visit: Payer: Self-pay | Attending: Internal Medicine

## 2015-07-22 DIAGNOSIS — M1611 Unilateral primary osteoarthritis, right hip: Secondary | ICD-10-CM

## 2015-07-22 MED ORDER — TRAMADOL HCL 50 MG PO TABS
50.0000 mg | ORAL_TABLET | Freq: Two times a day (BID) | ORAL | Status: DC | PRN
Start: 2015-07-22 — End: 2015-10-11

## 2015-07-22 NOTE — Telephone Encounter (Signed)
LVM Rx at front office ready to be pick up    Rx Tramadol place at front office

## 2015-07-22 NOTE — Telephone Encounter (Signed)
Pharmacy requesting Refill Rx Tramadol

## 2015-07-22 NOTE — Telephone Encounter (Signed)
Tramadol refill ready for pickup 

## 2015-09-17 ENCOUNTER — Other Ambulatory Visit: Payer: Self-pay | Admitting: Family Medicine

## 2015-09-25 ENCOUNTER — Other Ambulatory Visit: Payer: Self-pay | Admitting: Physician Assistant

## 2015-09-30 ENCOUNTER — Encounter (HOSPITAL_COMMUNITY): Payer: Self-pay

## 2015-09-30 ENCOUNTER — Ambulatory Visit: Payer: Self-pay | Attending: Family Medicine

## 2015-09-30 ENCOUNTER — Encounter (HOSPITAL_COMMUNITY)
Admission: RE | Admit: 2015-09-30 | Discharge: 2015-09-30 | Disposition: A | Discharge status: Home / Self Care | Payer: Self-pay | Source: Ambulatory Visit | Attending: Orthopaedic Surgery | Admitting: Orthopaedic Surgery

## 2015-09-30 DIAGNOSIS — M1611 Unilateral primary osteoarthritis, right hip: Secondary | ICD-10-CM | POA: Insufficient documentation

## 2015-09-30 DIAGNOSIS — Z01812 Encounter for preprocedural laboratory examination: Secondary | ICD-10-CM | POA: Insufficient documentation

## 2015-09-30 DIAGNOSIS — Z01818 Encounter for other preprocedural examination: Secondary | ICD-10-CM | POA: Insufficient documentation

## 2015-09-30 LAB — SURGICAL PCR SCREEN
MRSA, PCR: NEGATIVE
Staphylococcus aureus: NEGATIVE

## 2015-09-30 LAB — BASIC METABOLIC PANEL
Anion gap: 11 (ref 5–15)
BUN: 11 mg/dL (ref 6–20)
CALCIUM: 10.1 mg/dL (ref 8.9–10.3)
CO2: 25 mmol/L (ref 22–32)
CREATININE: 0.8 mg/dL (ref 0.44–1.00)
Chloride: 103 mmol/L (ref 101–111)
GFR calc Af Amer: >60 mL/min (ref >60)
GFR calc non Af Amer: >60 mL/min (ref >60)
GLUCOSE: 93 mg/dL (ref 65–99)
Potassium: 3.4 mmol/L — ABNORMAL LOW (ref 3.5–5.1)
Sodium: 139 mmol/L (ref 135–145)

## 2015-09-30 LAB — CBC
HEMATOCRIT: 35.3 % — AB (ref 36.0–46.0)
Hemoglobin: 11.1 g/dL — ABNORMAL LOW (ref 12.0–15.0)
MCH: 24.8 pg — AB (ref 26.0–34.0)
MCHC: 31.4 g/dL (ref 30.0–36.0)
MCV: 79 fL (ref 78.0–100.0)
Platelets: 397 10*3/uL (ref 150–400)
RBC: 4.47 MIL/uL (ref 3.87–5.11)
RDW: 14.8 % (ref 11.5–15.5)
WBC: 7.5 10*3/uL (ref 4.0–10.5)

## 2015-09-30 LAB — HCG, SERUM, QUALITATIVE: Preg, Serum: NEGATIVE

## 2015-09-30 NOTE — Progress Notes (Signed)
PCP is Josalyn Funches.  LOV

## 2015-09-30 NOTE — Pre-Procedure Instructions (Signed)
Martha Manning  09/30/2015      [facility] [place], [place] [address] Phone: [phone] Fax: [phone]  [facility] [place] ([address] [place] [id number] [place] ([place]) [place] [address] Phone: [phone] Fax: [phone]    Your procedure is scheduled on Tuesday, April 11th   Report to [facility] Admitting at 11:00 AM             ( Posted surgery time 1:00 - 3:00 PM)                                                                                                                                                                                                                                   Call this number if you have problems the morning of surgery:  [phone]   Remember:  Do not eat food or drink liquids after midnight Monday.  Take these medicines the morning of surgery with A SIP OF WATER : Tramadol                          4-5 days prior to surgery, STOP taking any vitamins, herbal supplements, anti-inflammatories (aleve, mobic, ibuprofen, advil, motrin)   Do not wear jewelry, make-up or nail polish.  Do not wear lotions, powders, or perfumes.  You may NOT wear deodorant.   Do not shave 48 hours prior to surgery.    Do not bring valuables to the hospital.  [facility] is not responsible for any belongings or valuables.  Contacts, dentures or bridgework may not be worn into surgery.  Leave your suitcase in the car.  After surgery it may be brought to your room.  For patients admitted to the hospital, discharge time will be determined by your treatment team.  Name and phone number of your driver:     Please read over the following fact sheets that you were given. Pain Booklet, Coughing and Deep Breathing, MRSA Information and Surgical Site Infection Prevention

## 2015-10-07 MED ORDER — CEFAZOLIN SODIUM-DEXTROSE 2-4 GM/100ML-% IV SOLN
2.0000 g | INTRAVENOUS | Status: AC
  Administered 2015-10-08: 2 g via INTRAVENOUS
  Filled 2015-10-07: qty 100

## 2015-10-07 MED ORDER — TRANEXAMIC ACID 1000 MG/10ML IV SOLN
1000.0000 mg | INTRAVENOUS | Status: AC
  Administered 2015-10-08: 1000 mg via INTRAVENOUS
  Filled 2015-10-07 (×2): qty 10

## 2015-10-07 NOTE — Progress Notes (Signed)
Patient called with questions about medications to take DOS. Advised patient not to take BP medication.

## 2015-10-08 ENCOUNTER — Encounter (HOSPITAL_COMMUNITY)
Admission: RE | Disposition: A | Discharge status: Home Health | Payer: Self-pay | Source: Ambulatory Visit | Attending: Orthopaedic Surgery

## 2015-10-08 ENCOUNTER — Encounter (HOSPITAL_COMMUNITY): Payer: Self-pay | Admitting: *Deleted

## 2015-10-08 ENCOUNTER — Inpatient Hospital Stay (HOSPITAL_COMMUNITY): Payer: Self-pay

## 2015-10-08 ENCOUNTER — Inpatient Hospital Stay (HOSPITAL_COMMUNITY): Payer: Self-pay | Admitting: Anesthesiology

## 2015-10-08 ENCOUNTER — Inpatient Hospital Stay (HOSPITAL_COMMUNITY)
Admission: RE | Admit: 2015-10-08 | Discharge: 2015-10-11 | DRG: 470 | Disposition: A | Discharge status: Home Health | Payer: Self-pay | Source: Ambulatory Visit | Attending: Orthopaedic Surgery | Admitting: Orthopaedic Surgery

## 2015-10-08 DIAGNOSIS — M1611 Unilateral primary osteoarthritis, right hip: Principal | ICD-10-CM | POA: Diagnosis present

## 2015-10-08 DIAGNOSIS — D62 Acute posthemorrhagic anemia: Secondary | ICD-10-CM | POA: Diagnosis not present

## 2015-10-08 DIAGNOSIS — Z96641 Presence of right artificial hip joint: Secondary | ICD-10-CM

## 2015-10-08 DIAGNOSIS — F17210 Nicotine dependence, cigarettes, uncomplicated: Secondary | ICD-10-CM | POA: Diagnosis present

## 2015-10-08 DIAGNOSIS — I10 Essential (primary) hypertension: Secondary | ICD-10-CM | POA: Diagnosis present

## 2015-10-08 DIAGNOSIS — Z419 Encounter for procedure for purposes other than remedying health state, unspecified: Secondary | ICD-10-CM

## 2015-10-08 DIAGNOSIS — Z79899 Other long term (current) drug therapy: Secondary | ICD-10-CM

## 2015-10-08 HISTORY — PX: TOTAL HIP ARTHROPLASTY: SHX124

## 2015-10-08 SURGERY — ARTHROPLASTY, HIP, TOTAL, ANTERIOR APPROACH
Anesthesia: Spinal | Site: Hip | Laterality: Right

## 2015-10-08 MED ORDER — ASPIRIN EC 325 MG PO TBEC
325.0000 mg | DELAYED_RELEASE_TABLET | Freq: Two times a day (BID) | ORAL | Status: DC
  Administered 2015-10-08 – 2015-10-11 (×6): 325 mg via ORAL
  Filled 2015-10-08 (×7): qty 1

## 2015-10-08 MED ORDER — CHLORHEXIDINE GLUCONATE 4 % EX LIQD: 60.0000 mL | Freq: Once | CUTANEOUS | Status: DC

## 2015-10-08 MED ORDER — MIDAZOLAM HCL 5 MG/5ML IJ SOLN
INTRAMUSCULAR | Status: DC | PRN
  Administered 2015-10-08: 2 mg via INTRAVENOUS

## 2015-10-08 MED ORDER — PHENOL 1.4 % MT LIQD: 1.0000 | OROMUCOSAL | Status: DC | PRN

## 2015-10-08 MED ORDER — METOCLOPRAMIDE HCL 5 MG/ML IJ SOLN: 5.0000 mg | Freq: Three times a day (TID) | INTRAMUSCULAR | Status: DC | PRN

## 2015-10-08 MED ORDER — ONDANSETRON HCL 4 MG/2ML IJ SOLN: 4.0000 mg | Freq: Four times a day (QID) | INTRAMUSCULAR | Status: DC | PRN

## 2015-10-08 MED ORDER — CEFAZOLIN SODIUM 1-5 GM-% IV SOLN
1.0000 g | Freq: Four times a day (QID) | INTRAVENOUS | Status: AC
  Administered 2015-10-08 (×2): 1 g via INTRAVENOUS
  Filled 2015-10-08 (×2): qty 50

## 2015-10-08 MED ORDER — ACETAMINOPHEN 650 MG RE SUPP: 650.0000 mg | Freq: Four times a day (QID) | RECTAL | Status: DC | PRN

## 2015-10-08 MED ORDER — SODIUM CHLORIDE 0.9 % IV SOLN: INTRAVENOUS | Status: DC

## 2015-10-08 MED ORDER — FENTANYL CITRATE (PF) 100 MCG/2ML IJ SOLN
INTRAMUSCULAR | Status: DC | PRN
  Administered 2015-10-08: 50 ug via INTRAVENOUS

## 2015-10-08 MED ORDER — SODIUM CHLORIDE 0.9 % IR SOLN
Status: DC | PRN
  Administered 2015-10-08: 1000 mL

## 2015-10-08 MED ORDER — DOCUSATE SODIUM 100 MG PO CAPS
100.0000 mg | ORAL_CAPSULE | Freq: Two times a day (BID) | ORAL | Status: DC
  Administered 2015-10-08 – 2015-10-11 (×6): 100 mg via ORAL
  Filled 2015-10-08 (×6): qty 1

## 2015-10-08 MED ORDER — METHOCARBAMOL 500 MG PO TABS
ORAL_TABLET | ORAL | Status: AC
  Filled 2015-10-08: qty 1

## 2015-10-08 MED ORDER — METOCLOPRAMIDE HCL 5 MG/ML IJ SOLN: 10.0000 mg | Freq: Once | INTRAMUSCULAR | Status: DC | PRN

## 2015-10-08 MED ORDER — ZOLPIDEM TARTRATE 5 MG PO TABS
5.0000 mg | ORAL_TABLET | Freq: Every evening | ORAL | Status: DC | PRN
  Administered 2015-10-08 – 2015-10-10 (×3): 5 mg via ORAL
  Filled 2015-10-08 (×3): qty 1

## 2015-10-08 MED ORDER — ADULT MULTIVITAMIN W/MINERALS CH: 1.0000 | ORAL_TABLET | Freq: Every day | ORAL | Status: DC | PRN

## 2015-10-08 MED ORDER — HYDROMORPHONE HCL 1 MG/ML IJ SOLN
INTRAMUSCULAR | Status: AC
  Administered 2015-10-08: 1 mg via INTRAVENOUS
  Filled 2015-10-08: qty 1

## 2015-10-08 MED ORDER — FENTANYL CITRATE (PF) 250 MCG/5ML IJ SOLN
INTRAMUSCULAR | Status: AC
  Filled 2015-10-08: qty 5

## 2015-10-08 MED ORDER — MIDAZOLAM HCL 2 MG/2ML IJ SOLN
INTRAMUSCULAR | Status: AC
  Filled 2015-10-08: qty 2

## 2015-10-08 MED ORDER — HYDROMORPHONE HCL 1 MG/ML IJ SOLN
0.2500 mg | INTRAMUSCULAR | Status: DC | PRN
  Administered 2015-10-08 (×2): 0.5 mg via INTRAVENOUS

## 2015-10-08 MED ORDER — HYDROMORPHONE HCL 1 MG/ML IJ SOLN
INTRAMUSCULAR | Status: AC
  Administered 2015-10-08: 0.5 mg via INTRAVENOUS
  Filled 2015-10-08: qty 1

## 2015-10-08 MED ORDER — METOCLOPRAMIDE HCL 5 MG PO TABS
5.0000 mg | ORAL_TABLET | Freq: Three times a day (TID) | ORAL | Status: DC | PRN
Start: 2015-10-08 — End: 2015-10-11
  Administered 2015-10-08: 5 mg via ORAL
  Filled 2015-10-08: qty 1

## 2015-10-08 MED ORDER — METHOCARBAMOL 500 MG PO TABS
500.0000 mg | ORAL_TABLET | Freq: Four times a day (QID) | ORAL | Status: DC | PRN
  Administered 2015-10-08 – 2015-10-11 (×7): 500 mg via ORAL
  Filled 2015-10-08 (×6): qty 1

## 2015-10-08 MED ORDER — LACTATED RINGERS IV SOLN
INTRAVENOUS | Status: DC
  Administered 2015-10-08: 11:00:00 via INTRAVENOUS

## 2015-10-08 MED ORDER — BUPIVACAINE IN DEXTROSE 0.75-8.25 % IT SOLN
INTRATHECAL | Status: DC | PRN
  Administered 2015-10-08: 1.8 mL via INTRATHECAL

## 2015-10-08 MED ORDER — MENTHOL 3 MG MT LOZG: 1.0000 | LOZENGE | OROMUCOSAL | Status: DC | PRN

## 2015-10-08 MED ORDER — LORATADINE 10 MG PO TABS: 10.0000 mg | ORAL_TABLET | Freq: Every day | ORAL | Status: DC | PRN

## 2015-10-08 MED ORDER — LACTATED RINGERS IV SOLN
INTRAVENOUS | Status: DC | PRN
  Administered 2015-10-08 (×2): via INTRAVENOUS

## 2015-10-08 MED ORDER — 0.9 % SODIUM CHLORIDE (POUR BTL) OPTIME
TOPICAL | Status: DC | PRN
  Administered 2015-10-08: 1000 mL

## 2015-10-08 MED ORDER — ALUM & MAG HYDROXIDE-SIMETH 200-200-20 MG/5ML PO SUSP: 30.0000 mL | ORAL | Status: DC | PRN

## 2015-10-08 MED ORDER — MEPERIDINE HCL 25 MG/ML IJ SOLN: 6.2500 mg | INTRAMUSCULAR | Status: DC | PRN

## 2015-10-08 MED ORDER — METHOCARBAMOL 1000 MG/10ML IJ SOLN: 500.0000 mg | Freq: Four times a day (QID) | INTRAVENOUS | Status: DC | PRN

## 2015-10-08 MED ORDER — PROPOFOL 500 MG/50ML IV EMUL
INTRAVENOUS | Status: DC | PRN
  Administered 2015-10-08: 75 ug/kg/min via INTRAVENOUS

## 2015-10-08 MED ORDER — LACTATED RINGERS IV SOLN: INTRAVENOUS | Status: DC

## 2015-10-08 MED ORDER — OXYCODONE HCL 5 MG PO TABS
ORAL_TABLET | ORAL | Status: AC
  Filled 2015-10-08: qty 1

## 2015-10-08 MED ORDER — OXYCODONE HCL 5 MG PO TABS
5.0000 mg | ORAL_TABLET | ORAL | Status: DC | PRN
  Administered 2015-10-08 (×2): 10 mg via ORAL
  Administered 2015-10-08 – 2015-10-09 (×2): 5 mg via ORAL
  Administered 2015-10-09 – 2015-10-11 (×10): 10 mg via ORAL
  Filled 2015-10-08 (×8): qty 2
  Filled 2015-10-08: qty 1
  Filled 2015-10-08 (×5): qty 2

## 2015-10-08 MED ORDER — ONDANSETRON HCL 4 MG PO TABS: 4.0000 mg | ORAL_TABLET | Freq: Four times a day (QID) | ORAL | Status: DC | PRN

## 2015-10-08 MED ORDER — ACETAMINOPHEN 325 MG PO TABS: 650.0000 mg | ORAL_TABLET | Freq: Four times a day (QID) | ORAL | Status: DC | PRN

## 2015-10-08 MED ORDER — HYDROMORPHONE HCL 1 MG/ML IJ SOLN
1.0000 mg | INTRAMUSCULAR | Status: DC | PRN
  Administered 2015-10-08 – 2015-10-10 (×8): 1 mg via INTRAVENOUS
  Filled 2015-10-08 (×8): qty 1

## 2015-10-08 MED ORDER — DIPHENHYDRAMINE HCL 12.5 MG/5ML PO ELIX: 12.5000 mg | ORAL_SOLUTION | ORAL | Status: DC | PRN

## 2015-10-08 SURGICAL SUPPLY — 50 items
APL SKNCLS STERI-STRIP NONHPOA (GAUZE/BANDAGES/DRESSINGS) ×1
BENZOIN TINCTURE PRP APPL 2/3 (GAUZE/BANDAGES/DRESSINGS) ×2 IMPLANT
BLADE SAW SGTL 18X1.27X75 (BLADE) ×2 IMPLANT
BLADE SURG ROTATE 9660 (MISCELLANEOUS) IMPLANT
CAPT HIP TOTAL 2 ×1 IMPLANT
CELLS DAT CNTRL 66122 CELL SVR (MISCELLANEOUS) ×1 IMPLANT
COVER SURGICAL LIGHT HANDLE (MISCELLANEOUS) ×2 IMPLANT
DRAPE C-ARM 42X72 X-RAY (DRAPES) ×2 IMPLANT
DRAPE STERI IOBAN 125X83 (DRAPES) ×2 IMPLANT
DRAPE U-SHAPE 47X51 STRL (DRAPES) ×6 IMPLANT
DRSG AQUACEL AG ADV 3.5X10 (GAUZE/BANDAGES/DRESSINGS) ×2 IMPLANT
DURAPREP 26ML APPLICATOR (WOUND CARE) ×2 IMPLANT
ELECT BLADE 4.0 EZ CLEAN MEGAD (MISCELLANEOUS) ×2
ELECT BLADE 6.5 EXT (BLADE) IMPLANT
ELECT REM PT RETURN 9FT ADLT (ELECTROSURGICAL) ×2
ELECTRODE BLDE 4.0 EZ CLN MEGD (MISCELLANEOUS) ×1 IMPLANT
ELECTRODE REM PT RTRN 9FT ADLT (ELECTROSURGICAL) ×1 IMPLANT
FACESHIELD WRAPAROUND (MASK) ×4 IMPLANT
FACESHIELD WRAPAROUND OR TEAM (MASK) ×2 IMPLANT
GLOVE BIOGEL PI IND STRL 8 (GLOVE) ×2 IMPLANT
GLOVE BIOGEL PI INDICATOR 8 (GLOVE) ×2
GLOVE ECLIPSE 8.0 STRL XLNG CF (GLOVE) ×2 IMPLANT
GLOVE ORTHO TXT STRL SZ7.5 (GLOVE) ×4 IMPLANT
GOWN STRL REUS W/ TWL LRG LVL3 (GOWN DISPOSABLE) ×2 IMPLANT
GOWN STRL REUS W/ TWL XL LVL3 (GOWN DISPOSABLE) ×2 IMPLANT
GOWN STRL REUS W/TWL LRG LVL3 (GOWN DISPOSABLE) ×4
GOWN STRL REUS W/TWL XL LVL3 (GOWN DISPOSABLE) ×6
HANDPIECE INTERPULSE COAX TIP (DISPOSABLE) ×2
KIT BASIN OR (CUSTOM PROCEDURE TRAY) ×2 IMPLANT
KIT ROOM TURNOVER OR (KITS) ×2 IMPLANT
MANIFOLD NEPTUNE II (INSTRUMENTS) ×2 IMPLANT
NS IRRIG 1000ML POUR BTL (IV SOLUTION) ×2 IMPLANT
PACK TOTAL JOINT (CUSTOM PROCEDURE TRAY) ×2 IMPLANT
PAD ARMBOARD 7.5X6 YLW CONV (MISCELLANEOUS) ×2 IMPLANT
RETRACTOR WND ALEXIS 18 MED (MISCELLANEOUS) ×1 IMPLANT
RTRCTR WOUND ALEXIS 18CM MED (MISCELLANEOUS) ×2
SET HNDPC FAN SPRY TIP SCT (DISPOSABLE) ×1 IMPLANT
STAPLER VISISTAT 35W (STAPLE) IMPLANT
STRIP CLOSURE SKIN 1/2X4 (GAUZE/BANDAGES/DRESSINGS) ×3 IMPLANT
SUT ETHIBOND NAB CT1 #1 30IN (SUTURE) ×3 IMPLANT
SUT MNCRL AB 4-0 PS2 18 (SUTURE) ×1 IMPLANT
SUT VIC AB 0 CT1 27 (SUTURE) ×2
SUT VIC AB 0 CT1 27XBRD ANBCTR (SUTURE) ×1 IMPLANT
SUT VIC AB 1 CT1 27 (SUTURE) ×2
SUT VIC AB 1 CT1 27XBRD ANBCTR (SUTURE) ×1 IMPLANT
SUT VIC AB 2-0 CT1 27 (SUTURE) ×4
SUT VIC AB 2-0 CT1 TAPERPNT 27 (SUTURE) ×1 IMPLANT
TOWEL OR 17X24 6PK STRL BLUE (TOWEL DISPOSABLE) ×2 IMPLANT
TOWEL OR 17X26 10 PK STRL BLUE (TOWEL DISPOSABLE) ×2 IMPLANT
TRAY FOLEY CATH 16FRSI W/METER (SET/KITS/TRAYS/PACK) ×1 IMPLANT

## 2015-10-08 NOTE — Anesthesia Preprocedure Evaluation (Addendum)
Anesthesia Evaluation  Patient identified by MRN, date of birth, ID band Patient awake    Reviewed: Allergy & Precautions, NPO status , Patient's Chart, lab work & pertinent test results  Airway Mallampati: II  TM Distance: >3 FB Neck ROM: Full    Dental no notable dental hx.    Pulmonary Current Smoker,    Pulmonary exam normal breath sounds clear to auscultation       Cardiovascular hypertension, Pt. on medications Normal cardiovascular exam Rhythm:Regular Rate:Normal     Neuro/Psych negative neurological ROS  negative psych ROS   GI/Hepatic negative GI ROS, Neg liver ROS,   Endo/Other  negative endocrine ROS  Renal/GU negative Renal ROS  negative genitourinary   Musculoskeletal negative musculoskeletal ROS (+)   Abdominal   Peds negative pediatric ROS (+)  Hematology negative hematology ROS (+)   Anesthesia Other Findings   Reproductive/Obstetrics negative OB ROS                            Anesthesia Physical Anesthesia Plan  ASA: II  Anesthesia Plan: Spinal   Post-op Pain Management:    Induction: Intravenous  Airway Management Planned: Simple Face Mask  Additional Equipment:   Intra-op Plan:   Post-operative Plan:   Informed Consent: I have reviewed the patients History and Physical, chart, labs and discussed the procedure including the risks, benefits and alternatives for the proposed anesthesia with the patient or authorized representative who has indicated his/her understanding and acceptance.   Dental advisory given  Plan Discussed with: CRNA  Anesthesia Plan Comments:         Anesthesia Quick Evaluation

## 2015-10-08 NOTE — Anesthesia Procedure Notes (Signed)
Spinal  Patient location during procedure: OR Staffing Anesthesiologist: Jalei Shibley Performed by: anesthesiologist  Preanesthetic Checklist Completed: patient identified, site marked, surgical consent, pre-op evaluation, timeout performed, IV checked, risks and benefits discussed and monitors and equipment checked Spinal Block Patient position: sitting Prep: Betadine Patient monitoring: heart rate, continuous pulse ox and blood pressure Approach: right paramedian Location: L2-3 Injection technique: single-shot Needle Needle type: Sprotte  Needle gauge: 24 G Needle length: 9 cm Additional Notes Expiration date of kit checked and confirmed. Patient tolerated procedure well, without complications.     

## 2015-10-08 NOTE — Brief Op Note (Signed)
10/08/2015  2:17 PM  PATIENT:  Martha Manning  60 y.o. female  PRE-OPERATIVE DIAGNOSIS:  Severe osteoarthritis right hip  POST-OPERATIVE DIAGNOSIS:  Severe osteoarthritis right hip  PROCEDURE:  Procedure(s): RIGHT TOTAL HIP ARTHROPLASTY ANTERIOR APPROACH (Right)  SURGEON:  Surgeon(s) and Role:    * Mcarthur Rossetti, MD - Primary  PHYSICIAN ASSISTANT: Bernardine Stabile, PA-C  ANESTHESIA:   spinal  EBL:  Total I/O In: 1000 [I.V.:1000] Out: 450 [Urine:100; Blood:350]  COUNTS:  YES  TOURNIQUET:  * No tourniquets in log *  DICTATION: .Other Dictation: Dictation Number [id number]  PLAN OF CARE: Admit to inpatient   PATIENT DISPOSITION:  PACU - hemodynamically stable.   Delay start of Pharmacological VTE agent (>24hrs) due to surgical blood loss or risk of bleeding: no

## 2015-10-08 NOTE — H&P (Signed)
TOTAL HIP ADMISSION H&P  Patient is admitted for right total hip arthroplasty.  Subjective:  Chief Complaint: right hip pain  HPI: Martha Manning, 60 y.o. female, has a history of pain and functional disability in the right hip(s) due to arthritis and patient has failed non-surgical conservative treatments for greater than 12 weeks to include NSAID's and/or analgesics, corticosteriod injections, flexibility and strengthening excercises, supervised PT with diminished ADL's post treatment, use of assistive devices, weight reduction as appropriate and activity modification.  Onset of symptoms was gradual starting 5 years ago with gradually worsening course since that time.The patient noted no past surgery on the right hip(s).  Patient currently rates pain in the right hip at 10 out of 10 with activity. Patient has night pain, worsening of pain with activity and weight bearing, trendelenberg gait, pain that interfers with activities of daily living, pain with passive range of motion and crepitus. Patient has evidence of subchondral cysts, subchondral sclerosis, periarticular osteophytes and joint space narrowing by imaging studies. This condition presents safety issues increasing the risk of falls.  There is no current active infection.  Patient Active Problem List   Diagnosis Date Noted  . Ganglion of left wrist 05/16/2015  . Essential hypertension, benign 03/14/2014  . Osteoarthritis of right hip 03/14/2014  . Smoking 03/14/2014   Past Medical History  Diagnosis Date  . Hypertension   . Arthritis     Past Surgical History  Procedure Laterality Date  . Vaginal delivery      41 yrs ago    Prescriptions prior to admission  Medication Sig Dispense Refill Last Dose  . Aspirin-Acetaminophen-Caffeine (GOODY HEADACHE PO) Take 1 packet by mouth daily as needed (headache).    Past Week at Unknown time  . ibuprofen (ADVIL,MOTRIN) 200 MG tablet Take 600 mg by mouth every 6 (six) hours as needed  for headache or moderate pain.    Past Week at Unknown time  . lisinopril-hydrochlorothiazide (PRINZIDE,ZESTORETIC) 20-25 MG tablet Take 1 tablet by mouth daily. 30 tablet 5 10/07/2015 at Unknown time  . loratadine (ALLERGY RELIEF) 10 MG tablet Take 10 mg by mouth daily as needed for allergies or itching.   Past Week at Unknown time  . meloxicam (MOBIC) 15 MG tablet TAKE ONE TABLET BY MOUTH ONCE DAILY (Patient taking differently: TAKE ONE TABLET BY MOUTH twice a day) 30 tablet 0 Past Week at Unknown time  . Menthol-Methyl Salicylate (MUSCLE RUB) 10-15 % CREA Apply 1 application topically at bedtime as needed for muscle pain.   Past Month at Unknown time  . Multiple Vitamin (MULTIVITAMIN WITH MINERALS) TABS tablet Take 1 tablet by mouth daily as needed (for supplementation).    Past Month at Unknown time  . traMADol (ULTRAM) 50 MG tablet Take 1 tablet (50 mg total) by mouth every 12 (twelve) hours as needed. As needed for pain (Patient taking differently: Take 50 mg by mouth 5 (five) times daily. As needed for pain) 60 tablet 0 10/08/2015 at 0800   No Known Allergies  Social History  Substance Use Topics  . Smoking status: Current Every Day Smoker -- 0.25 packs/day for 40 years    Types: Cigarettes  . Smokeless tobacco: Not on file  . Alcohol Use: 3.6 oz/week    6 Glasses of wine per week    Family History  Problem Relation Age of Onset  . Hypertension Mother   . Stroke Mother   . Hypertension Sister      Review of Systems  Musculoskeletal: Positive for joint pain.  All other systems reviewed and are negative.   Objective:  Physical Exam  Constitutional: She is oriented to person, place, and time. She appears well-developed and well-nourished.  HENT:  Head: Normocephalic and atraumatic.  Eyes: EOM are normal. Pupils are equal, round, and reactive to light.  Neck: Normal range of motion. Neck supple.  Cardiovascular: Normal rate and regular rhythm.   Respiratory: Effort normal and  breath sounds normal.  GI: Soft. Bowel sounds are normal.  Musculoskeletal:       Right hip: She exhibits decreased range of motion, decreased strength, tenderness, bony tenderness and crepitus.  Neurological: She is alert and oriented to person, place, and time.  Skin: Skin is warm and dry.  Psychiatric: She has a normal mood and affect.    Vital signs in last 24 hours: Temp:  [98.2 F (36.8 C)] 98.2 F (36.8 C) (04/11 1050) Pulse Rate:  [105] 105 (04/11 1050) Resp:  [20] 20 (04/11 1050) BP: (131)/(92) 131/92 mmHg (04/11 1050) SpO2:  [100 %] 100 % (04/11 1050) Weight:  [60.782 kg (134 lb)] 60.782 kg (134 lb) (04/11 1050)  Labs:   Estimated body mass index is 26.62 kg/(m^2) as calculated from the following:   Height as of this encounter: 4' 11.5" (1.511 m).   Weight as of this encounter: 60.782 kg (134 lb).   Imaging Review Plain radiographs demonstrate severe degenerative joint disease of the right hip(s). The bone quality appears to be good for age and reported activity level.  Assessment/Plan:  End stage arthritis, right hip(s)  The patient history, physical examination, clinical judgement of the provider and imaging studies are consistent with end stage degenerative joint disease of the right hip(s) and total hip arthroplasty is deemed medically necessary. The treatment options including medical management, injection therapy, arthroscopy and arthroplasty were discussed at length. The risks and benefits of total hip arthroplasty were presented and reviewed. The risks due to aseptic loosening, infection, stiffness, dislocation/subluxation,  thromboembolic complications and other imponderables were discussed.  The patient acknowledged the explanation, agreed to proceed with the plan and consent was signed. Patient is being admitted for inpatient treatment for surgery, pain control, PT, OT, prophylactic antibiotics, VTE prophylaxis, progressive ambulation and ADL's and discharge  planning.The patient is planning to be discharged home with home health services

## 2015-10-08 NOTE — Transfer of Care (Signed)
Immediate Anesthesia Transfer of Care Note  Patient: Martha Manning  Procedure(s) Performed: Procedure(s): RIGHT TOTAL HIP ARTHROPLASTY ANTERIOR APPROACH (Right)  Patient Location: PACU  Anesthesia Type:Spinal  Level of Consciousness: awake, alert  and oriented  Airway & Oxygen Therapy: Patient Spontanous Breathing and Patient connected to nasal cannula oxygen  Post-op Assessment: Report given to RN and Post -op Vital signs reviewed and stable  Post vital signs: Reviewed and stable  Last Vitals:  Filed Vitals:   10/08/15 1050  BP: 131/92  Pulse: 105  Temp: 36.8 C  Resp: 20    Complications: No apparent anesthesia complications

## 2015-10-09 ENCOUNTER — Encounter (HOSPITAL_COMMUNITY): Payer: Self-pay | Admitting: Orthopaedic Surgery

## 2015-10-09 LAB — CBC
HEMATOCRIT: 26.4 % — AB (ref 36.0–46.0)
Hemoglobin: 8.5 g/dL — ABNORMAL LOW (ref 12.0–15.0)
MCH: 25.6 pg — ABNORMAL LOW (ref 26.0–34.0)
MCHC: 32.2 g/dL (ref 30.0–36.0)
MCV: 79.5 fL (ref 78.0–100.0)
PLATELETS: 299 10*3/uL (ref 150–400)
RBC: 3.32 MIL/uL — ABNORMAL LOW (ref 3.87–5.11)
RDW: 14.9 % (ref 11.5–15.5)
WBC: 8.6 10*3/uL (ref 4.0–10.5)

## 2015-10-09 LAB — BASIC METABOLIC PANEL
ANION GAP: 9 (ref 5–15)
BUN: 6 mg/dL (ref 6–20)
CALCIUM: 8.8 mg/dL — AB (ref 8.9–10.3)
CO2: 23 mmol/L (ref 22–32)
CREATININE: 0.5 mg/dL (ref 0.44–1.00)
Chloride: 105 mmol/L (ref 101–111)
GLUCOSE: 110 mg/dL — AB (ref 65–99)
Potassium: 3.2 mmol/L — ABNORMAL LOW (ref 3.5–5.1)
Sodium: 137 mmol/L (ref 135–145)

## 2015-10-09 MED ORDER — LISINOPRIL 20 MG PO TABS
20.0000 mg | ORAL_TABLET | Freq: Every day | ORAL | Status: DC
  Administered 2015-10-09 – 2015-10-11 (×3): 20 mg via ORAL
  Filled 2015-10-09 (×3): qty 1

## 2015-10-09 NOTE — Progress Notes (Signed)
Physical Therapy Treatment Patient Details Name: Martha Manning MRN: [id number] DOB: 04/08/1956 Today's Date: 10/09/2015    History of Present Illness 60 y.o. female admitted to [facility] on 10/08/15 s/p direct anterior R THA.  Pt with significant PMHx of HTN, arhtritis (seems to have more ortho PMHx as both right and left legs have significant scars).    PT Comments    Pt is progressing slowly with her mobility, only able to walk around the room this PM with RW.  I am hopeful to progress her gait into the hallway tomorrow.  HEP exercises progressed as well.  PT to follow acutely until d/c confirmed.     Follow Up Recommendations  Home health PT;Supervision for mobility/OOB     Equipment Recommendations  None recommended by PT    Recommendations for Other Services   NA     Precautions / Restrictions Precautions Precautions: Fall Precaution Comments: Direct anterior hip Restrictions RLE Weight Bearing: Weight bearing as tolerated    Mobility  Bed Mobility Overal bed mobility: Needs Assistance Bed Mobility: Sit to Supine;Supine to Sit     Supine to sit: Min assist Sit to supine: Min assist   General bed mobility comments: Min assist to help progress right leg both into and out of bed.  Pt felt it was easier to go to the right side of the bed this PM.   Transfers Overall transfer level: Needs assistance Equipment used: Rolling walker (2 wheeled)   Sit to Stand: Min guard         General transfer comment: Min guard assist to stabilize RW during transition of hands from bed. Verbal cues for safe hand placement.  Significant extra time needed due to pain.   Ambulation/Gait Ambulation/Gait assistance: Min assist Ambulation Distance (Feet): 25 Feet Assistive device: Rolling walker (2 wheeled) Gait Pattern/deviations: Step-to pattern;Antalgic Gait velocity: decreased Gait velocity interpretation: <1.8 ft/sec, indicative of risk for recurrent falls General Gait Details:  Verbal cues for breathing, correct LE sequencing a RW proximity.           Balance   Sitting-balance support: Bilateral upper extremity supported;No upper extremity supported;Single extremity supported Sitting balance-Leahy Scale: Good     Standing balance support: Bilateral upper extremity supported;No upper extremity supported;Single extremity supported Standing balance-Leahy Scale: Fair Standing balance comment: Able to brush teeth, wash face and hands at sink in bathroom without upper extremity support in static standing.                     Cognition Arousal/Alertness: Awake/alert Behavior During Therapy: WFL for tasks assessed/performed Overall Cognitive Status: Within Functional Limits for tasks assessed                      Exercises Total Joint Exercises Short Arc Quad: AROM;Right;10 reps Heel Slides: AAROM;Right;10 reps Hip ABduction/ADduction: AAROM;Right;10 reps        Pertinent Vitals/Pain Pain Assessment: 0-10 Pain Score: 10-Worst pain ever Pain Location: right groin Pain Descriptors / Indicators: Aching;Burning;Grimacing;Guarding Pain Intervention(s): Limited activity within patient's tolerance;Monitored during session;Repositioned;Patient requesting pain meds-RN notified           PT Goals (current goals can now be found in the care plan section) Acute Rehab PT Goals Patient Stated Goal: decrease pain Progress towards PT goals: Progressing toward goals    Frequency  7X/week    PT Plan Current plan remains appropriate       End of Session   Activity Tolerance: Patient limited  by pain Patient left: in bed;with call bell/phone within reach     Time: [id number] PT Time Calculation (min) (ACUTE ONLY): 40 min  Charges:  $Gait Training: 23-37 mins $Therapeutic Exercise: 8-22 mins                      Brentin Shin B. [place], [place], DPT [phone]   10/09/2015, 5:41 PM

## 2015-10-09 NOTE — Evaluation (Signed)
Physical Therapy Evaluation Patient Details Name: Martha Manning MRN: [id number] DOB: 07-21-1955 Today's Date: 10/09/2015   History of Present Illness  60 y.o. female admitted to [facility] on 10/08/15 s/p direct anterior R THA.  Pt with significant PMHx of HTN, arhtritis (seems to have more ortho PMHx as both right and left legs have significant scars).  Clinical Impression  Pt is POD #1 and is moving very slowly.  Pt is very guarded due to pain and moaning out throughout session. HEP exercises initiated.  I hope to progress gait into the hallway this PM.   PT to follow acutely for deficits listed below.       Follow Up Recommendations Home health PT;Supervision for mobility/OOB    Equipment Recommendations  None recommended by PT    Recommendations for Other Services   NA    Precautions / Restrictions Precautions Precautions: Fall Precaution Comments: Direct anterior hip Restrictions Weight Bearing Restrictions: Yes RLE Weight Bearing: Weight bearing as tolerated      Mobility  Bed Mobility Overal bed mobility: Needs Assistance Bed Mobility: Sit to Supine      Sit to supine: Min assist   General bed mobility comments: Min assist to help progress right leg into the bed (lift).  Pt needing significant extra time to complete task.   Transfers Overall transfer level: Needs assistance Equipment used: Rolling walker (2 wheeled) Transfers: Sit to/from Stand Sit to Stand: Supervision         General transfer comment: supervision for safety, pt able to push up from low recliner chair with both hands, again, significant extra time needed as pt is moving and transitioning very slowly.   Ambulation/Gait Ambulation/Gait assistance: Min assist Ambulation Distance (Feet): 5 Feet Assistive device: Rolling walker (2 wheeled) Gait Pattern/deviations: Step-to pattern;Antalgic;Trunk flexed Gait velocity:     General Gait Details: Verbal cues for upright posture, closer proximity to  the RW and correct LE sequencing.  Pt's gait distance limited by pain.         Balance Overall balance assessment: Needs assistance Sitting-balance support: Feet supported;No upper extremity supported Sitting balance-Leahy Scale: Good     Standing balance support: Bilateral upper extremity supported Standing balance-Leahy Scale: Poor Standing balance comment: RW for support                             Pertinent Vitals/Pain Pain Assessment: 0-10 Pain Score: 9  Pain Location: right hip Pain Descriptors / Indicators: Aching;Burning;Grimacing;Guarding Pain Intervention(s): Limited activity within patient's tolerance;Monitored during session;Repositioned;Patient requesting pain meds-RN notified (RN reports pain meds are not due until 1300)    Home Living Family/patient expects to be discharged to:: Private residence Living Arrangements: Alone Available Help at Discharge: Family;Available 24 hours/day (son will be staying with her initially) Type of Home: Apartment Home Access: Stairs to enter   Entrance Stairs-Number of Steps: 2 flights (28 stairs) Home Layout: One level Home Equipment: Walker - 2 wheels Additional Comments: Son will stay at her house through the weekend and then she is going to son's house.      Prior Function Level of Independence: Independent         Comments: occasional use of RW in home for mobility        Extremity/Trunk Assessment   Upper Extremity Assessment: Defer to OT evaluation           Lower Extremity Assessment: RLE deficits/detail RLE Deficits / Details: right leg with normal  post op pain and weakness.  Pt with at least 3-/5 ankle, 2/5 knee 2-/5 hip     Cervical / Trunk Assessment: Normal  Communication   Communication: No difficulties  Cognition Arousal/Alertness: Awake/alert Behavior During Therapy: WFL for tasks assessed/performed Overall Cognitive Status: Within Functional Limits for tasks assessed                          Exercises Total Joint Exercises Ankle Circles/Pumps: AROM;Both;20 reps Quad Sets: AROM;Right;10 reps Heel Slides: AAROM;Right;10 reps      Assessment/Plan    PT Assessment Patient needs continued PT services  PT Diagnosis Difficulty walking;Abnormality of gait;Generalized weakness;Acute pain   PT Problem List Decreased strength;Decreased range of motion;Decreased activity tolerance;Decreased balance;Decreased mobility;Decreased knowledge of use of DME;Pain  PT Treatment Interventions DME instruction;Gait training;Stair training;Functional mobility training;Therapeutic activities;Therapeutic exercise;Balance training;Neuromuscular re-education;Patient/family education;Manual techniques;Modalities   PT Goals (Current goals can be found in the Care Plan section) Acute Rehab PT Goals Patient Stated Goal: decrease pain PT Goal Formulation: With patient Time For Goal Achievement: 10/16/15 Potential to Achieve Goals: Good    Frequency 7X/week   Barriers to discharge Inaccessible home environment Pt has 28 steps to enter her home       End of Session   Activity Tolerance: Patient limited by pain Patient left: in bed;with call bell/phone within reach           Time: 1142-1203 PT Time Calculation (min) (ACUTE ONLY): 21 min   Charges:   PT Evaluation $PT Eval Low Complexity: 1 Procedure          [name], [place], DPT [phone]   10/09/2015, 12:22 PM

## 2015-10-09 NOTE — Progress Notes (Signed)
Utilization review completed.  

## 2015-10-09 NOTE — Progress Notes (Signed)
Foley removed. Pt is due to void at 1500 .  Ave Filter, RN

## 2015-10-09 NOTE — Evaluation (Signed)
Occupational Therapy Evaluation Patient Details Name: Martha Manning MRN: [id number] DOB: 1955-09-09 Today's Date: 10/09/2015    History of Present Illness Pt is a 60 y.o. s/p RIGHT TOTAL HIP ARTHROPLASTY ANTERIOR APPROACH. PMHx: HTN, Arithritis.    Clinical Impression   Pt limited this session by pain (12/10) but was agreeable to participate in therapy. Pt reports she was independent with ADLs and occasionally used a RW in the home for mobility. Currently pt is overall min assist for ADLs and functional mobility with the exception of mod assist for LB ADLs. Began safety and ADL education with pt; she verbalized understanding. Pt lives alone and is planning to d/c home with 24/7 supervision initially from her son. Recommending HHOT for follow up in order to maximize independence and safety with ADLs and functional mobility upon return home. Pt would benefit from continued skilled OT to address established goals.    Follow Up Recommendations  Home health OT;Supervision/Assistance - 24 hour    Equipment Recommendations  3 in 1 bedside comode;Other (comment) (AE?)    Recommendations for Other Services PT consult     Precautions / Restrictions Precautions Precautions: Fall Precaution Comments: Direct anterior hip Restrictions Weight Bearing Restrictions: Yes RLE Weight Bearing: Weight bearing as tolerated      Mobility Bed Mobility Overal bed mobility: Needs Assistance Bed Mobility: Supine to Sit     Supine to sit: Min assist;HOB elevated     General bed mobility comments: Min assist for bringing RLE to EOB, pt able to manage UB with HOB elevated and use of bed rails.  Transfers Overall transfer level: Needs assistance Equipment used: Rolling walker (2 wheeled) Transfers: Sit to/from Stand Sit to Stand: Min assist         General transfer comment: Min assist for safety. Pt with good hand placement and technique. VCs for backing up to chair until legs touch prior to  sitting down.    Balance Overall balance assessment: Needs assistance Sitting-balance support: Feet supported;No upper extremity supported Sitting balance-Leahy Scale: Good     Standing balance support: Bilateral upper extremity supported Standing balance-Leahy Scale: Poor Standing balance comment: RW for support                            ADL Overall ADL's : Needs assistance/impaired Eating/Feeding: Set up;Sitting   Grooming: Min guard;Standing   Upper Body Bathing: Supervision/ safety;Sitting   Lower Body Bathing: Moderate assistance;Sit to/from stand   Upper Body Dressing : Supervision/safety;Sitting   Lower Body Dressing: Moderate assistance;Sit to/from stand Lower Body Dressing Details (indicate cue type and reason): Pt unable to don socks sitting EOB. Pt reports son can assist with LB ADLs as needed; also discussed use of AE -pt agreeable. Toilet Transfer: Minimal assistance;Ambulation;BSC;RW Toilet Transfer Details (indicate cue type and reason): Simulated by transfer from EOB to chair. Toileting- Water quality scientist and Hygiene: Min guard;Sit to/from stand       Functional mobility during ADLs: Minimal assistance;Rolling walker General ADL Comments: Educated pt R leg first into clothing, ice for edema and pain, importance of OOB activities and sitting up in chair.     Vision     Perception     Praxis      Pertinent Vitals/Pain Pain Assessment: 0-10 Pain Score: 10-Worst pain ever ("12" at start of session) Pain Location: R hip Pain Descriptors / Indicators: Aching;Grimacing;Operative site guarding;Moaning Pain Intervention(s): Limited activity within patient's tolerance;Monitored during session;Repositioned;Patient requesting pain meds-RN notified;RN  gave pain meds during session;Ice applied     Hand Dominance     Extremity/Trunk Assessment Upper Extremity Assessment Upper Extremity Assessment: Overall WFL for tasks assessed   Lower  Extremity Assessment Lower Extremity Assessment: Defer to PT evaluation   Cervical / Trunk Assessment Cervical / Trunk Assessment: Normal   Communication Communication Communication: No difficulties   Cognition Arousal/Alertness: Awake/alert Behavior During Therapy: WFL for tasks assessed/performed Overall Cognitive Status: Within Functional Limits for tasks assessed                     General Comments       Exercises       Shoulder Instructions      Home Living Family/patient expects to be discharged to:: Private residence Living Arrangements: Alone Available Help at Discharge: Family;Available 24 hours/day (son will be staying with her initially) Type of Home: Apartment Home Access: Stairs to enter Entrance Stairs-Number of Steps: 2 flights (28 stairs)   Home Layout: One level     Bathroom Shower/Tub: Tub/shower unit Shower/tub characteristics: Architectural technologist: Standard     Home Equipment: Environmental consultant - 2 wheels          Prior Functioning/Environment Level of Independence: Independent             OT Diagnosis: Generalized weakness;Acute pain   OT Problem List: Decreased strength;Impaired balance (sitting and/or standing);Decreased safety awareness;Decreased knowledge of precautions;Decreased knowledge of use of DME or AE   OT Treatment/Interventions: Self-care/ADL training;Energy conservation;DME and/or AE instruction;Therapeutic activities;Patient/family education;Balance training    OT Goals(Current goals can be found in the care plan section) Acute Rehab OT Goals Patient Stated Goal: decrease pain OT Goal Formulation: With patient Time For Goal Achievement: 10/23/15 Potential to Achieve Goals: Good ADL Goals Pt Will Perform Lower Body Bathing: with supervision;with adaptive equipment;sit to/from stand Pt Will Perform Lower Body Dressing: with supervision;with adaptive equipment;sit to/from stand Pt Will Transfer to Toilet: with  supervision;ambulating;bedside commode (over toilet) Pt Will Perform Toileting - Clothing Manipulation and hygiene: with supervision;sit to/from stand Pt Will Perform Tub/Shower Transfer: with supervision;Tub transfer;ambulating;3 in 1;rolling walker  OT Frequency: Min 2X/week   Barriers to D/C: Inaccessible home environment;Decreased caregiver support  28 stairs into apartment, lives alone       Co-evaluation              End of Session Equipment Utilized During Treatment: Gait belt;Rolling walker Nurse Communication: Patient requests pain meds;Mobility status  Activity Tolerance: Patient limited by pain Patient left: in chair;with call bell/phone within reach   Time: 0855-0920 OT Time Calculation (min): 25 min Charges:  OT General Charges $OT Visit: 1 Procedure OT Evaluation $OT Eval Moderate Complexity: 1 Procedure OT Treatments $Self Care/Home Management : 8-22 mins G-Codes:     Binnie Kand M.S., OTR/L Pager: [phone]  10/09/2015, 9:45 AM

## 2015-10-09 NOTE — Progress Notes (Addendum)
Spoke with patient about discharge plan, she states that she will be going to stay with her son in [place] as of 10/13/15 for at least a week. Contacted [facility] secondary to patient being uninsured, they will not be able to work with her due to not having service in [place]. Contacted  Cache Decoursey at [facility] since they occasionally work with uninsured clients, she will check and let me know on 10/10/15. Patient stated that she has a rolling walker, contacted [facility] for 3N1. Will continue to follow to set up HHPT.

## 2015-10-09 NOTE — Anesthesia Postprocedure Evaluation (Signed)
Anesthesia Post Note  Patient: Martha Manning  Procedure(s) Performed: Procedure(s) (LRB): RIGHT TOTAL HIP ARTHROPLASTY ANTERIOR APPROACH (Right)  Patient location during evaluation: PACU Anesthesia Type: Spinal Level of consciousness: oriented and awake and alert Pain management: pain level controlled Vital Signs Assessment: post-procedure vital signs reviewed and stable Respiratory status: spontaneous breathing, respiratory function stable and patient connected to nasal cannula oxygen Cardiovascular status: blood pressure returned to baseline and stable Postop Assessment: no headache and no backache Anesthetic complications: no    Last Vitals:  Filed Vitals:   10/08/15 2155 10/09/15 0230  BP: 140/78 139/86  Pulse: 100 100  Temp: 36.9 C 37.3 C  Resp: 18 18    Last Pain:  Filed Vitals:   10/09/15 0717  PainSc: Asleep                 Montez Hageman

## 2015-10-09 NOTE — Op Note (Signed)
Martha Manning, Martha Manning             ACCOUNT NO.:  [id number]  MEDICAL RECORD NO.:  [id number]  LOCATION:  [id number]                        FACILITY:  [facility]  PHYSICIAN:  Lind Guest. 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  PROCEDURE:  Right total hip arthroplasty, direct anterior approach.  IMPLANTS:  DePuy Sector Gription acetabular component size 48, a single screw, size 32 neutral polyethylene liner, size 10 Corail femoral component with standard offset, size 32+ 1 ceramic hip ball.  SURGEON:  Lind Guest. Ninfa Linden, M.D.  ASSISTANT:  Erskine Emery, PA-C.  ANESTHESIA:  Spinal.  ANTIBIOTICS:  2 g of IV Ancef.  BLOOD LOSS:  350 mL.  COMPLICATIONS:  None.  INDICATIONS:  Martha Manning is a 60 year old female, well known to me.  I have been seeing her for many years now with worsening and debilitating arthritis involving her right hip.  Her x-ray showed complete loss of superior lateral joint space.  There are cystic and sclerotic changes. She is significantly shortened down on the right than the left.  She has failed all forms of conservative treatment.  At this point, we have recommended a total hip arthroplasty through direct anterior approach. We have talked to her about the risk of acute blood loss anemia, nerve and vessel injury, fracture, infection, dislocation, DVT.  She understands our goals are decreased pain, improved mobility, and overall improved quality of life.  PROCEDURE DESCRIPTION:  After informed consent was obtained, appropriate right leg was marked.  She was brought to the operating room and  spinal anesthesia was obtained while she was on her stretcher.  A Foley catheter was placed and then traction boots were placed on both of her feet.  Next, she was placed supine on the [facility] fracture table with perineal post in place and both legs in inline with skeletal traction devices, but no traction applied.  Her right operative hip was then prepped and draped with DuraPrep and sterile drapes.  A time-out was called to identify correct patient and correct right hip.  I then made incision inferior and posterior to the anterior superior iliac spine and carried this obliquely down the leg.  We dissected down the tensor fascia lata muscle.  Tensor fascia was then divided longitudinally, so we could proceed with direct anterior approach to the hip.  We identified and cauterized the circumflex vessels and then identified the hip capsule.  I opened up the hip capsule in L-type format finding a large joint effusion.  We then had to actually perform a capsulotomy due to her severe shortening and scar tissue.  We then were able to expose the femoral neck and put Cobra retractors around the medial and lateral femoral neck.  We made our femoral neck cut with an oscillating saw proximal to the lesser trochanter and completed this with an osteotome. We then placed a corkscrew guide in the femoral head and removed the femoral  head in its entirety.  We found it to be completely devoid of cartilage, it was very small.  We then cleaned the acetabular remnants of the acetabular labrum and other debris and then began reaming from a size 42 reamer up to size 48 with all reamers under direct visualization and the last reamer under direct fluoroscopy, so we could obtain our depth of reaming, our inclination, and anteversion.  We then placed the real DePuy Sector Gription acetabular component size 48 and a single screw.  We placed a 32+ 0 polyethylene liner for that size 48 component. We then turned our  attention to the femur.  With the leg externally rotated to 100 degrees extended and adducted, we were able to use a box cutting osteotome to enter the femoral canal and a rongeur to lateralize.  We released the lateral joint capsule.  We began broaching from a size 8, broached up to a size 10.  With the size 10 in place, it was nice and tight.  We reduced the calcar planer and then trialed a standard offset femoral neck and a 32+ 1 hip ball.  We brought the leg back over and up with traction and internal rotation reducing the pelvis.  We were pleased with leg length, offset, and stability with range of motion.  We then dislocated the hip and removed the trial components.  We placed the real Corail femoral component size 10 and standard neck with a standard offset and the real 32+ 1 hip ball, reduced this acetabulum and again it was stable.  We then copiously irrigated the soft tissues with normal saline solution using pulsatile lavage.  There was no joint capsule to close, so we closed the iliotibial band with a running #1 Vicryl suture followed by 0 Vicryl in deep tissue, 2-0 Vicryl in subcutaneous tissue, 4-0 Monocryl subcuticular stitch, and Steri-Strips on the skin, and an Aquacel dressing was applied.  She was taken off the [facility] table, taken to the recovery room in stable condition.  All final counts were correct. There were no complications noted.  Of note, Erskine Emery, PA-C, assisted in entire case.  His assistance was crucial for facilitating all aspects of this case.     Lind Guest. Ninfa Linden, M.D.     CYB/MEDQ  D:  10/08/2015  T:  10/09/2015  Job:  [id number]

## 2015-10-09 NOTE — Progress Notes (Signed)
Subjective: 1 Day Post-Op Procedure(s) (LRB): RIGHT TOTAL HIP ARTHROPLASTY ANTERIOR APPROACH (Right) Patient reports pain as moderate.    Objective: Vital signs in last 24 hours: Temp:  [97.7 F (36.5 C)-99.1 F (37.3 C)] 99.1 F (37.3 C) (04/12 0230) Pulse Rate:  [81-105] 100 (04/12 0230) Resp:  [11-20] 18 (04/12 0230) BP: (113-142)/(78-96) 139/86 mmHg (04/12 0230) SpO2:  [100 %] 100 % (04/12 0230) FiO2 (%):  [24 %] 24 % (04/11 1740) Weight:  [60.782 kg (134 lb)] 60.782 kg (134 lb) (04/11 1050)  Intake/Output from previous day: 04/11 0701 - 04/12 0700 In: 2290 [P.O.:240; I.V.:2050] Out: 1175 [Urine:825; Blood:350] Intake/Output this shift:    No results for input(s): HGB in the last 72 hours. No results for input(s): WBC, RBC, HCT, PLT in the last 72 hours. No results for input(s): NA, K, CL, CO2, BUN, CREATININE, GLUCOSE, CALCIUM in the last 72 hours. No results for input(s): LABPT, INR in the last 72 hours.  Sensation intact distally Intact pulses distally Dorsiflexion/Plantar flexion intact Incision: dressing C/D/I  Assessment/Plan: 1 Day Post-Op Procedure(s) (LRB): RIGHT TOTAL HIP ARTHROPLASTY ANTERIOR APPROACH (Right) Up with therapy Discharge home with home health next 1-2 days  BLACKMAN,CHRISTOPHER Y 10/09/2015, 7:11 AM

## 2015-10-09 NOTE — Care Management Note (Deleted)
Case Management Note  Patient Details  Name: EISLEY JACOBI MRN: [id number] Date of Birth: 1955/08/31  Subjective/Objective:                    Action/Plan:   Expected Discharge Date:                  Expected Discharge Plan:     In-House Referral:     Discharge planning Services     Post Acute Care Choice:    Choice offered to:     DME Arranged:    DME Agency:     HH Arranged:    [place] Agency:     Status of Service:     Medicare Important Message Given:    Date Medicare IM Given:    Medicare IM give by:    Date Additional Medicare IM Given:    Additional Medicare Important Message give by:     If discussed at [place] of Stay Meetings, dates discussed:    Additional Comments:  Nila Nephew, RN 10/09/2015, 4:13 PM

## 2015-10-09 NOTE — Progress Notes (Signed)
PT Cancellation Note  Patient Details Name: Martha Manning MRN: [id number] DOB: Sep 21, 1955   Cancelled Treatment:    Reason Eval/Treat Not Completed: Pain limiting ability to participate.  Pt tearful, crying.  Pt reports, "I don't know how I am going to do this" PT encouraged her to take one day at a time.  Reported to RN pt is in pain.  PT to check back later after pain meds have taken effect to work second session with pt.  Thanks,    Barbarann Ehlers. [place], [place], DPT [phone]   10/09/2015, 2:55 PM

## 2015-10-09 NOTE — Discharge Instructions (Signed)

## 2015-10-10 LAB — CBC
HEMATOCRIT: 26 % — AB (ref 36.0–46.0)
HEMOGLOBIN: 8.2 g/dL — AB (ref 12.0–15.0)
MCH: 25 pg — ABNORMAL LOW (ref 26.0–34.0)
MCHC: 31.5 g/dL (ref 30.0–36.0)
MCV: 79.3 fL (ref 78.0–100.0)
Platelets: 287 10*3/uL (ref 150–400)
RBC: 3.28 MIL/uL — AB (ref 3.87–5.11)
RDW: 14.5 % (ref 11.5–15.5)
WBC: 9.9 10*3/uL (ref 4.0–10.5)

## 2015-10-10 MED ORDER — ASPIRIN 325 MG PO TBEC: 325.0000 mg | DELAYED_RELEASE_TABLET | Freq: Two times a day (BID) | ORAL | Status: DC

## 2015-10-10 MED ORDER — OXYCODONE HCL 5 MG PO TABS: 5.0000 mg | ORAL_TABLET | ORAL | Status: DC | PRN

## 2015-10-10 MED ORDER — METHOCARBAMOL 500 MG PO TABS: 500.0000 mg | ORAL_TABLET | Freq: Four times a day (QID) | ORAL | Status: DC | PRN

## 2015-10-10 NOTE — Progress Notes (Signed)
Physical Therapy Treatment Patient Details Name: Martha Manning MRN: [id number] DOB: 1955-12-20 Today's Date: 10/10/2015    History of Present Illness 60 y.o. female admitted to [facility] on 10/08/15 s/p direct anterior R THA.  Pt with significant PMHx of HTN, arhtritis (seems to have more ortho PMHx as both right and left legs have significant scars).    PT Comments    Pt is POD #2 and is moving much better than yesterday.  Supervision overall with her gait and walked a great distance into the hallway.  We will initiate stair training this PM given she has two flights to get into her home.  Given her current progress, I do feel she will be ready after her AM PT session tomorrow.  PT to follow acutely until d/c confirmed.     Follow Up Recommendations  Home health PT;Supervision for mobility/OOB     Equipment Recommendations  None recommended by PT    Recommendations for Other Services   NA     Precautions / Restrictions Precautions Precautions: Fall Precaution Comments: Direct anterior hip Restrictions RLE Weight Bearing: Weight bearing as tolerated    Mobility  Bed Mobility Overal bed mobility: Needs Assistance Bed Mobility: Sit to Supine       Sit to supine: Min assist   General bed mobility comments: Min assist to help lift right leg into the bed.  Educated on looping her leg with her other leg and pt was unable to do this method at this time.   Transfers Overall transfer level: Needs assistance Equipment used: Rolling walker (2 wheeled) Transfers: Sit to/from Stand Sit to Stand: Supervision         General transfer comment: supervision for safety  Ambulation/Gait Ambulation/Gait assistance: Supervision Ambulation Distance (Feet): 150 Feet Assistive device: Rolling walker (2 wheeled) Gait Pattern/deviations: Step-through pattern;Antalgic;Trunk flexed Gait velocity: decreased Gait velocity interpretation: Below normal speed for age/gender General Gait Details:  verbal cues for upright posture and closer proximity to RW.  Pt with moderately antalgic gait pattern at first and was able to loosen up as she progressed her gait distance.           Balance Overall balance assessment: Needs assistance Sitting-balance support: Feet supported;No upper extremity supported Sitting balance-Leahy Scale: Good     Standing balance support: Bilateral upper extremity supported;No upper extremity supported;Single extremity supported Standing balance-Leahy Scale: Fair                      Cognition Arousal/Alertness: Awake/alert Behavior During Therapy: WFL for tasks assessed/performed Overall Cognitive Status: Within Functional Limits for tasks assessed                      Exercises Total Joint Exercises Ankle Circles/Pumps: AROM;Both;20 reps Quad Sets: AROM;Right;10 reps Short Arc Quad: AROM;Right;10 reps Heel Slides: AAROM;Right;10 reps Hip ABduction/ADduction: AAROM;Right;10 reps        Pertinent Vitals/Pain Pain Assessment: 0-10 Pain Score: 3  Pain Location: right hip Pain Descriptors / Indicators: Grimacing;Guarding Pain Intervention(s): Limited activity within patient's tolerance;Monitored during session;Repositioned;Patient requesting pain meds-RN notified;RN gave pain meds during session           PT Goals (current goals can now be found in the care plan section) Acute Rehab PT Goals Patient Stated Goal: decrease pain, regain independence Progress towards PT goals: Progressing toward goals    Frequency  7X/week    PT Plan Current plan remains appropriate       End  of Session   Activity Tolerance: Patient limited by pain Patient left: in bed;with call bell/phone within reach     Time: [id number] PT Time Calculation (min) (ACUTE ONLY): 36 min  Charges:  $Gait Training: 8-22 mins $Therapeutic Exercise: 8-22 mins                      Apostolos Blagg B. [place], [place], DPT [phone]   10/10/2015, 11:53 AM

## 2015-10-10 NOTE — Progress Notes (Signed)
Physical Therapy Treatment Patient Details Name: Martha Manning MRN: [id number] DOB: 1955-11-24 Today's Date: 10/10/2015    History of Present Illness 60 y.o. female admitted to [facility] on 10/08/15 s/p direct anterior R THA.  Pt with significant PMHx of HTN, arhtritis (seems to have more ortho PMHx as both right and left legs have significant scars).    PT Comments    Pt is POD #2 and is moving well.  She was able to successfully initiate stair training and standing hip exercises.  PT plans to see her in the AM to practice two flights of stairs in preparation for d/c home.  PT to follow acutely until d/c confirmed.     Follow Up Recommendations  Home health PT;Supervision for mobility/OOB     Equipment Recommendations  None recommended by PT    Recommendations for Other Services   NA     Precautions / Restrictions Precautions Precautions: Fall Precaution Comments: Direct anterior hip Restrictions Weight Bearing Restrictions: No RLE Weight Bearing: Weight bearing as tolerated    Mobility  Bed Mobility Overal bed mobility: Needs Assistance Bed Mobility: Supine to Sit     Supine to sit: Min guard     General bed mobility comments: Min guard assist to progress right leg over EOB.    Transfers Overall transfer level: Needs assistance Equipment used: Rolling walker (2 wheeled) Transfers: Sit to/from Stand Sit to Stand: Supervision         General transfer comment: supervision for safety.  Verbal cues for safe hand placement as pt likes to pull up on RW  Ambulation/Gait Ambulation/Gait assistance: Supervision Ambulation Distance (Feet): 200 Feet Assistive device: Rolling walker (2 wheeled) Gait Pattern/deviations: Step-through pattern;Antalgic Gait velocity: decreased Gait velocity interpretation: Below normal speed for age/gender (but improving every session) General Gait Details: Pt with minimally antalgic gait pattern that improved with increased distance.   Better safety.  Verbal cues for safe hand placement on RW   Stairs Stairs: Yes Stairs assistance: Min guard Stair Management: One rail Right;One rail Left;Step to pattern;Forwards;Sideways Number of Stairs: 2 (x2) General stair comments: Pt tried stairs forward with one rail and sideways with one rail.  Verbal cues for correct LE sequencing.  Pt found that she liked going up forward and down sideways, which she can do at home as she has both rails, they are just too far apart to reach both of them.          Balance Overall balance assessment: Needs assistance Sitting-balance support: No upper extremity supported Sitting balance-Leahy Scale: Good     Standing balance support: Bilateral upper extremity supported;No upper extremity supported Standing balance-Leahy Scale: Fair                      Cognition Arousal/Alertness: Awake/alert Behavior During Therapy: WFL for tasks assessed/performed Overall Cognitive Status: Within Functional Limits for tasks assessed                      Exercises Total Joint Exercises Hip ABduction/ADduction: AROM;Right;10 reps;Standing Knee Flexion: AROM;Right;10 reps;Standing Marching in Standing: AROM;Right;10 reps;Standing Standing Hip Extension: AROM;Right;10 reps;Standing        Pertinent Vitals/Pain Pain Assessment: 0-10 Pain Score: 6  Pain Location: right hip Pain Descriptors / Indicators: Aching;Grimacing;Guarding Pain Intervention(s): Limited activity within patient's tolerance;Monitored during session;Repositioned           PT Goals (current goals can now be found in the care plan section) Acute Rehab PT Goals  Patient Stated Goal: decrease pain, regain independence Progress towards PT goals: Progressing toward goals    Frequency  7X/week    PT Plan Current plan remains appropriate       End of Session   Activity Tolerance: Patient limited by pain Patient left: in chair;with call bell/phone within  reach     Time: 1538-1606 PT Time Calculation (min) (ACUTE ONLY): 28 min  Charges:  $Gait Training: 23-37 mins                      [employer] B. [place], [place], DPT [phone]   10/10/2015, 11:04 PM

## 2015-10-10 NOTE — Progress Notes (Signed)
Subjective: 2 Days Post-Op Procedure(s) (LRB): RIGHT TOTAL HIP ARTHROPLASTY ANTERIOR APPROACH (Right) Patient reports pain as moderate.  Worked slowly with therapy yesterday.  Acute blood loss anemia from surgery, but stable.  Objective: Vital signs in last 24 hours: Temp:  [97.6 F (36.4 C)-100.4 F (38 C)] 97.7 F (36.5 C) (04/13 0300) Pulse Rate:  [99-117] 116 (04/13 0300) Resp:  [18] 18 (04/12 2100) BP: (129-143)/(84-85) 129/84 mmHg (04/13 0300) SpO2:  [100 %] 100 % (04/13 0300)  Intake/Output from previous day: 04/12 0701 - 04/13 0700 In: 1265 [P.O.:690; I.V.:575] Out: 300 [Urine:300] Intake/Output this shift: Total I/O In: 120 [P.O.:120] Out: -    Recent Labs  10/09/15 0711 10/10/15 0520  HGB 8.5* 8.2*    Recent Labs  10/09/15 0711 10/10/15 0520  WBC 8.6 9.9  RBC 3.32* 3.28*  HCT 26.4* 26.0*  PLT 299 287    Recent Labs  10/09/15 0711  NA 137  K 3.2*  CL 105  CO2 23  BUN 6  CREATININE 0.50  GLUCOSE 110*  CALCIUM 8.8*   No results for input(s): LABPT, INR in the last 72 hours.  Sensation intact distally Intact pulses distally Dorsiflexion/Plantar flexion intact Incision: scant drainage  Assessment/Plan: 2 Days Post-Op Procedure(s) (LRB): RIGHT TOTAL HIP ARTHROPLASTY ANTERIOR APPROACH (Right) Up with therapy Plan for discharge tomorrow Discharge home with home health  Mcarthur Rossetti 10/10/2015, 6:37 AM

## 2015-10-10 NOTE — Care Management Note (Signed)
Case Management Note  Patient Details  Name: Martha Manning MRN: [id number] Date of Birth: October 19, 1955  Subjective/Objective:           S/p right total hip arthroplasty         Action/Plan: Spoke with patient about discharge plan, she is going to stay with her son [name] at [address] [place], [place] [address] starting 10/13/15. Wyn Quaker is a suburban of [place]. Mr Pate's cell number is [phone]. Spoke with Stanton Kidney at [address], they will be able to provide 2 PT visits with start of service 10/14/15. Visits and home heath options are limited due to lack of insurance and area patient will be located. Patient agreeable to discharge plan.3N1 delivered to patient by [facility], patient states that she already has a rolling walker.        Expected Discharge Date:                  Expected Discharge Plan:  [place]  In-House Referral:  Financial Counselor  Discharge planning Services  CM Consult  Post Acute Care Choice:  Durable Medical Equipment, Home Health Choice offered to:  Patient  DME Arranged:  3-N-1 DME Agency:  [place]:  PT [facility] Agency:  [facility]  Status of Service:  Completed, signed off  Medicare Important Message Given:    Date Medicare IM Given:    Medicare IM give by:    Date Additional Medicare IM Given:    Additional Medicare Important Message give by:     If discussed at [place] of Stay Meetings, dates discussed:    Additional Comments:  Nila Nephew, RN 10/10/2015, 1:10 PM

## 2015-10-10 NOTE — Progress Notes (Signed)
Occupational Therapy Treatment Patient Details Name: Martha Manning MRN: [id number] DOB: Feb 23, 1956 Today's Date: 10/10/2015    History of present illness 60 y.o. female admitted to [facility] on 10/08/15 s/p direct anterior R THA.  Pt with significant PMHx of HTN, arhtritis (seems to have more ortho PMHx as both right and left legs have significant scars).   OT comments  Pt progressing very well towards occupational therapy goals. Educated and provided pt with AE for LB ADLs which pt found very useful. Pt performed functional transfers with min guard assist and min verbal cues for safety. Reviewed energy conservation, pain/edema management, and fall prevention strategies. Will follow up with pt tomorrow to complete tub transfer using 3in1 as shower seat.    Follow Up Recommendations  No OT follow up;Supervision - Intermittent    Equipment Recommendations  3 in 1 bedside comode;Other (comment) (Adaptive equipment - provided by OT)    Recommendations for Other Services      Precautions / Restrictions Precautions Precautions: Fall Precaution Comments: Direct anterior hip Restrictions Weight Bearing Restrictions: Yes RLE Weight Bearing: Weight bearing as tolerated       Mobility Bed Mobility               General bed mobility comments: Pt up in chair on OT arrival  Transfers Overall transfer level: Needs assistance Equipment used: Rolling walker (2 wheeled) Transfers: Sit to/from Stand Sit to Stand: Min guard         General transfer comment: Min guard for safety as pt with increased pain. No LOB or reports of dizziness.    Balance Overall balance assessment: Needs assistance Sitting-balance support: No upper extremity supported;Feet supported Sitting balance-Leahy Scale: Good     Standing balance support: Bilateral upper extremity supported;During functional activity Standing balance-Leahy Scale: Fair                     ADL Overall ADL's : Needs  assistance/impaired         Upper Body Bathing: Supervision/ safety;Sitting   Lower Body Bathing: Supervison/ safety;With adaptive equipment;Sit to/from stand;Cueing for compensatory techniques   Upper Body Dressing : Supervision/safety;Sitting   Lower Body Dressing: Supervision/safety;With adaptive equipment;Cueing for compensatory techniques;Sit to/from stand;Sitting/lateral leans   Toilet Transfer: Min guard;Cueing for safety;Ambulation;BSC;RW   Toileting- Water quality scientist and Hygiene: Min guard;Sit to/from stand       Functional mobility during ADLs: Min guard;Rolling walker General ADL Comments: Educated and provided pt with AE for LB ADLs. Also reviewed energy conservation, pain/edema management and fall prevention strategies.       Vision                     Perception     Praxis      Cognition   Behavior During Therapy: WFL for tasks assessed/performed Overall Cognitive Status: Within Functional Limits for tasks assessed                       Extremity/Trunk Assessment               Exercises     Shoulder Instructions       General Comments      Pertinent Vitals/ Pain       Pain Assessment: 0-10 Pain Score: 6  Pain Location: R hip Pain Descriptors / Indicators: Aching Pain Intervention(s): Limited activity within patient's tolerance;Monitored during session;Repositioned;Premedicated before session  Home Living  Prior Functioning/Environment              Frequency Min 2X/week     Progress Toward Goals  OT Goals(current goals can now be found in the care plan section)  Progress towards OT goals: Progressing toward goals  Acute Rehab OT Goals Patient Stated Goal: decrease pain, regain independence OT Goal Formulation: With patient Time For Goal Achievement: 10/23/15 Potential to Achieve Goals: Good ADL Goals Pt Will Perform Lower Body Bathing: with  supervision;with adaptive equipment;sit to/from stand Pt Will Perform Lower Body Dressing: with supervision;with adaptive equipment;sit to/from stand Pt Will Transfer to Toilet: with supervision;ambulating;bedside commode (over toilet) Pt Will Perform Toileting - Clothing Manipulation and hygiene: with supervision;sit to/from stand Pt Will Perform Tub/Shower Transfer: with supervision;Tub transfer;ambulating;3 in 1;rolling walker  Plan Discharge plan needs to be updated    Co-evaluation                 End of Session Equipment Utilized During Treatment: Gait belt;Rolling walker   Activity Tolerance Patient tolerated treatment well   Patient Left in chair;with call bell/phone within reach   Nurse Communication Mobility status        Time: [id number] OT Time Calculation (min): 12 min  Charges: OT General Charges $OT Visit: 1 Procedure OT Treatments $Self Care/Home Management : 8-22 mins  Redmond Baseman, OTR/L Pager: [phone] 10/10/2015, 4:59 PM

## 2015-10-11 NOTE — Progress Notes (Signed)
Patient is discharged from room 5N26 at this time. Alert and instable condition. IV site d/c'd. Instructions read to patient with understanding verbalized. Left unit via wheelchair with all belongings and family at side.

## 2015-10-11 NOTE — Progress Notes (Signed)
Physical Therapy Treatment Patient Details Name: Martha Manning MRN: [id number] DOB: July 28, 1955 Today's Date: 10/11/2015    History of Present Illness 60 y.o. female admitted to [facility] on 10/08/15 s/p direct anterior R THA.  Pt with significant PMHx of HTN, arhtritis (seems to have more ortho PMHx as both right and left legs have significant scars).    PT Comments    Patient is making good progress with PT.  From a mobility standpoint anticipate patient will be ready for DC home when medically ready.     Follow Up Recommendations  Home health PT;Supervision for mobility/OOB     Equipment Recommendations  None recommended by PT    Recommendations for Other Services       Precautions / Restrictions Precautions Precautions: Fall Precaution Comments: Direct anterior hip Restrictions Weight Bearing Restrictions: Yes RLE Weight Bearing: Weight bearing as tolerated    Mobility  Bed Mobility Overal bed mobility: Needs Assistance Bed Mobility: Supine to Sit;Sit to Supine     Supine to sit: Supervision Sit to supine: Min assist   General bed mobility comments: supervision for safety; HOB flat and min use of bed rail; assist to bring R LE into bed with pt able to elevate bilat LE most of the way   Transfers Overall transfer level: Needs assistance Equipment used: Rolling walker (2 wheeled) Transfers: Sit to/from Stand Sit to Stand: Supervision         General transfer comment: cues for hand placement  Ambulation/Gait Ambulation/Gait assistance: Supervision Ambulation Distance (Feet): 220 Feet Assistive device: Rolling walker (2 wheeled) Gait Pattern/deviations: Step-through pattern;Trunk flexed Gait velocity: decreased   General Gait Details: cues for posture and position of RW   Stairs Stairs: Yes Stairs assistance: Min guard Stair Management: One rail Right Number of Stairs: 12 General stair comments: educated on sequencing and technique with some carry over  demonstrated from previous session  Wheelchair Mobility    Modified Rankin (Stroke Patients Only)       Balance Overall balance assessment: Needs assistance Sitting-balance support: No upper extremity supported;Feet supported Sitting balance-Leahy Scale: Good     Standing balance support: No upper extremity supported Standing balance-Leahy Scale: Fair                      Cognition Arousal/Alertness: Awake/alert Behavior During Therapy: WFL for tasks assessed/performed Overall Cognitive Status: Within Functional Limits for tasks assessed                      Exercises      General Comments General comments (skin integrity, edema, etc.): reviewed HEP      Pertinent Vitals/Pain Pain Assessment: 0-10 Pain Score: 7  Faces Pain Scale: Hurts little more Pain Location: R hip Pain Descriptors / Indicators: Sore Pain Intervention(s): Limited activity within patient's tolerance;Monitored during session;Repositioned;Patient requesting pain meds-RN notified    Home Living                      Prior Function            PT Goals (current goals can now be found in the care plan section) Acute Rehab PT Goals Patient Stated Goal: go home Progress towards PT goals: Progressing toward goals    Frequency  7X/week    PT Plan Current plan remains appropriate    Co-evaluation             End of Session Equipment Utilized During Treatment: Gait  belt Activity Tolerance: Patient tolerated treatment well Patient left: with call bell/phone within reach;in bed     Time: 0842-0911 PT Time Calculation (min) (ACUTE ONLY): 29 min  Charges:  $Gait Training: 8-22 mins $Therapeutic Activity: 8-22 mins                    G Codes:      [place] April, PTA Pager: [phone]   10/11/2015, 11:59 AM

## 2015-10-11 NOTE — Progress Notes (Signed)
Occupational Therapy Treatment Patient Details Name: Martha Manning MRN: [id number] DOB: 1955/11/29 Today's Date: 10/11/2015    History of present illness 60 y.o. female admitted to [facility] on 10/08/15 s/p direct anterior R THA.  Pt with significant PMHx of HTN, arhtritis (seems to have more ortho PMHx as both right and left legs have significant scars).   OT comments  Pt able to meet all OT goals this session. Currently pt is overall supervision for safety with ADLs and functional mobility. Educated pt on tub transfer technique with use of 3 in 1; pt able to return demo. All education complete; pt with no further questions or concerns for OT at this time. Pt ready to d/c from OT standpoint. No further acute OT needs identified; signing off at this time. Please re-consult if needs change.    Follow Up Recommendations  No OT follow up;Supervision - Intermittent    Equipment Recommendations  3 in 1 bedside comode;Other (comment) (AE)    Recommendations for Other Services      Precautions / Restrictions Precautions Precautions: Fall Precaution Comments: Direct anterior hip Restrictions Weight Bearing Restrictions: Yes RLE Weight Bearing: Weight bearing as tolerated       Mobility Bed Mobility Overal bed mobility: Needs Assistance Bed Mobility: Supine to Sit;Sit to Supine     Supine to sit: Supervision;HOB elevated Sit to supine: Supervision;HOB elevated   General bed mobility comments: Supervision for safety. HOB elevated with use of bed rail.  Transfers Overall transfer level: Needs assistance Equipment used: Rolling walker (2 wheeled) Transfers: Sit to/from Stand Sit to Stand: Supervision         General transfer comment: Supervision for safety. VCs x 1 for hand placement and then pt able to perform correctly for all other transfers.    Balance Overall balance assessment: Needs assistance Sitting-balance support: No upper extremity supported;Feet supported Sitting  balance-Leahy Scale: Good     Standing balance support: No upper extremity supported;During functional activity Standing balance-Leahy Scale: Fair                     ADL Overall ADL's : Needs assistance/impaired     Grooming: Supervision/safety;Standing;Oral care;Wash/dry hands               Lower Body Dressing: Minimal assistance Lower Body Dressing Details (indicate cue type and reason): for adjusting socks Toilet Transfer: Supervision/safety;Ambulation;BSC;RW (BSC over toilet)   Toileting- Clothing Manipulation and Hygiene: Supervision/safety;Sit to/from stand   Tub/ Shower Transfer: Supervision/safety;Tub transfer;Ambulation;3 in 1;Rolling walker Tub/Shower Transfer Details (indicate cue type and reason): Educated pt on tub transfer technique; pt able to return demo with supervision for safety. Educated on need for supervision with tub transfers upon return home and provided handout. Functional mobility during ADLs: Supervision/safety;Rolling walker General ADL Comments: Educated on home safety, energy conservation strategies. Pt with no further questions or concerns for OT at this time.      Vision                     Perception     Praxis      Cognition   Behavior During Therapy: [facility] for tasks assessed/performed Overall Cognitive Status: Within Functional Limits for tasks assessed                       Extremity/Trunk Assessment               Exercises     Shoulder Instructions  General Comments      Pertinent Vitals/ Pain       Pain Assessment: Faces Faces Pain Scale: Hurts little more Pain Location: R hip Pain Descriptors / Indicators: Throbbing Pain Intervention(s): Limited activity within patient's tolerance;Monitored during session;Repositioned;Patient requesting pain meds-RN notified;Ice applied  Home Living                                          Prior Functioning/Environment               Frequency Min 2X/week     Progress Toward Goals  OT Goals(current goals can now be found in the care plan section)  Progress towards OT goals: Goals met/education completed, patient discharged from OT  Acute Rehab OT Goals Patient Stated Goal: home today OT Goal Formulation: All assessment and education complete, DC therapy  Plan Discharge plan remains appropriate;All goals met and education completed, patient discharged from OT services    Co-evaluation                 End of Session Equipment Utilized During Treatment: Rolling walker   Activity Tolerance Patient tolerated treatment well   Patient Left in bed;with call bell/phone within reach   Nurse Communication Patient requests pain meds        Time: 2202-5427 OT Time Calculation (min): 23 min  Charges: OT General Charges $OT Visit: 1 Procedure OT Treatments $Self Care/Home Management : 23-37 mins  Binnie Kand M.S., OTR/L Pager: [phone]  10/11/2015, 8:13 AM

## 2015-10-11 NOTE — Progress Notes (Signed)
Subjective: 3 Days Post-Op Procedure(s) (LRB): RIGHT TOTAL HIP ARTHROPLASTY ANTERIOR APPROACH (Right) Patient reports pain as moderate.  Working well with therapy.  Objective: Vital signs in last 24 hours: Temp:  [98.7 F (37.1 C)-100.5 F (38.1 C)] 98.7 F (37.1 C) (04/14 0527) Pulse Rate:  [117-120] 118 (04/14 0527) Resp:  [18] 18 (04/14 0527) BP: (122-132)/(74-84) 132/82 mmHg (04/14 0527) SpO2:  [99 %-100 %] 100 % (04/14 0527)  Intake/Output from previous day: 04/13 0701 - 04/14 0700 In: 810 [P.O.:810] Out: -  Intake/Output this shift:     Recent Labs  10/09/15 0711 10/10/15 0520  HGB 8.5* 8.2*    Recent Labs  10/09/15 0711 10/10/15 0520  WBC 8.6 9.9  RBC 3.32* 3.28*  HCT 26.4* 26.0*  PLT 299 287    Recent Labs  10/09/15 0711  NA 137  K 3.2*  CL 105  CO2 23  BUN 6  CREATININE 0.50  GLUCOSE 110*  CALCIUM 8.8*   No results for input(s): LABPT, INR in the last 72 hours.  Sensation intact distally Intact pulses distally Dorsiflexion/Plantar flexion intact Incision: dressing C/D/I  Assessment/Plan: 3 Days Post-Op Procedure(s) (LRB): RIGHT TOTAL HIP ARTHROPLASTY ANTERIOR APPROACH (Right) Up with therapy Discharge home with home health today.  Mcarthur Rossetti 10/11/2015, 6:36 AM

## 2015-10-11 NOTE — Clinical Social Work Note (Signed)
CSW received referral for SNF.  Case discussed with case manager, and plan is to discharge home.  CSW to sign off please re-consult if social work needs arise.  Addaleigh Nicholls R. Daphna Lafuente, MSW, LCSWA [phone]  

## 2015-10-11 NOTE — Discharge Summary (Signed)
Patient ID: Martha Manning MRN: [id number] DOB/AGE: 1956-03-18 60 y.o.  Admit date: 10/08/2015 Discharge date: 10/11/2015  Admission Diagnoses:  Principal Problem:   Osteoarthritis of right hip Active Problems:   Status post total replacement of right hip   Discharge Diagnoses:  Same  Past Medical History  Diagnosis Date  . Hypertension   . Arthritis     "right hip" (10/08/2015)    Surgeries: Procedure(s): RIGHT TOTAL HIP ARTHROPLASTY ANTERIOR APPROACH on 10/08/2015   Consultants:    Discharged Condition: Improved  Hospital Course: JEYMI NEWBERN is an 60 y.o. female who was admitted 10/08/2015 for operative treatment ofOsteoarthritis of right hip. Patient has severe unremitting pain that affects sleep, daily activities, and work/hobbies. After pre-op clearance the patient was taken to the operating room on 10/08/2015 and underwent  Procedure(s): RIGHT TOTAL HIP ARTHROPLASTY ANTERIOR APPROACH.    Patient was given perioperative antibiotics: Anti-infectives    Start     Dose/Rate Route Frequency Ordered Stop   10/08/15 1800  ceFAZolin (ANCEF) IVPB 1 g/50 mL premix     1 g 100 mL/hr over 30 Minutes Intravenous Every 6 hours 10/08/15 1739 10/08/15 2350   10/08/15 1200  ceFAZolin (ANCEF) IVPB 2g/100 mL premix     2 g 200 mL/hr over 30 Minutes Intravenous To [facility]Stay Surgical 10/07/15 1127 10/08/15 1215       Patient was given sequential compression devices, early ambulation, and chemoprophylaxis to prevent DVT.  Patient benefited maximally from hospital stay and there were no complications.    Recent vital signs: Patient Vitals for the past 24 hrs:  BP Temp Temp src Pulse Resp SpO2  10/11/15 0527 132/82 mmHg 98.7 F (37.1 C) - (!) 118 18 100 %  10/10/15 1937 122/74 mmHg 100 F (37.8 C) Oral (!) 120 18 100 %  10/10/15 1300 130/81 mmHg (!) 100.5 F (38.1 C) Oral (!) 117 18 99 %  10/10/15 0924 128/84 mmHg - - - - -     Recent laboratory studies:  Recent Labs  10/09/15 0711 10/10/15 0520  WBC 8.6 9.9  HGB 8.5* 8.2*  HCT 26.4* 26.0*  PLT 299 287  NA 137  --   K 3.2*  --   CL 105  --   CO2 23  --   BUN 6  --   CREATININE 0.50  --   GLUCOSE 110*  --   CALCIUM 8.8*  --      Discharge Medications:     Medication List    STOP taking these medications        GOODY HEADACHE PO     ibuprofen 200 MG tablet  Commonly known as:  ADVIL,MOTRIN     traMADol 50 MG tablet  Commonly known as:  ULTRAM      TAKE these medications        ALLERGY RELIEF 10 MG tablet  Generic drug:  loratadine  Take 10 mg by mouth daily as needed for allergies or itching.     aspirin 325 MG EC tablet  Take 1 tablet (325 mg total) by mouth 2 (two) times daily after a meal.     lisinopril-hydrochlorothiazide 20-25 MG tablet  Commonly known as:  PRINZIDE,ZESTORETIC  Take 1 tablet by mouth daily.     meloxicam 15 MG tablet  Commonly known as:  MOBIC  TAKE ONE TABLET BY MOUTH ONCE DAILY     methocarbamol 500 MG tablet  Commonly known as:  ROBAXIN  Take 1 tablet (500 mg  total) by mouth every 6 (six) hours as needed for muscle spasms.     multivitamin with minerals Tabs tablet  Take 1 tablet by mouth daily as needed (for supplementation).     MUSCLE RUB 10-15 % Crea  Apply 1 application topically at bedtime as needed for muscle pain.     oxyCODONE 5 MG immediate release tablet  Commonly known as:  Oxy IR/ROXICODONE  Take 1-2 tablets (5-10 mg total) by mouth every 4 (four) hours as needed for severe pain or breakthrough pain.        Diagnostic Studies: Dg Hip Port Unilat With Pelvis 1v Right  10/08/2015  CLINICAL DATA:  Right hip replacement EXAM: DG HIP (WITH OR WITHOUT PELVIS) 1V PORT RIGHT COMPARISON:  None. FINDINGS: Changes of right hip replacement. Normal AP alignment. No hardware bony complicating feature. Soft tissue gas noted. IMPRESSION: Right hip replacement.  No complicating feature. Electronically Signed   By: Rolm Baptise M.D.   On:  10/08/2015 15:22   Dg Hip Operative Unilat W Or W/o Pelvis Right  10/09/2015  CLINICAL DATA:  Right total hip arthroplasty EXAM: OPERATIVE right HIP (WITH PELVIS IF PERFORMED) 2 VIEWS TECHNIQUE: Fluoroscopic spot image(s) were submitted for interpretation post-operatively. COMPARISON:  Right hip films of 03/13/2014 FINDINGS: Two C-arm spot films were returned for interpretation. The femoral and acetabular components of the right total hip replacement appear to be in good position with no complicating features. IMPRESSION: Right total hip replacement components in good position. No acute abnormality. Electronically Signed   By: Ivar Drape M.D.   On: 10/09/2015 10:31    Disposition: 01-Home or Self Care      Discharge Instructions    Discharge patient    Complete by:  As directed            Follow-up Information    Follow up with Mcarthur Rossetti, MD In 2 weeks.   Specialty:  Orthopedic Surgery   Contact information:   [place] [address] [phone]       Follow up with [facility].   Specialty:  Home Health Services   Why:  They wil contact you to schedule home therapy visits.    Contact information:   [address] [place] [address] [phone]       Follow up with Mcarthur Rossetti, MD In 2 weeks.   Specialty:  Orthopedic Surgery   Contact information:   [place] [address] [phone]        Signed: Mcarthur Rossetti 10/11/2015, 6:37 AM

## 2015-11-13 ENCOUNTER — Other Ambulatory Visit: Payer: Self-pay | Admitting: Family Medicine

## 2015-12-02 ENCOUNTER — Ambulatory Visit: Payer: Self-pay | Attending: Family Medicine | Admitting: Family Medicine

## 2015-12-02 ENCOUNTER — Encounter: Payer: Self-pay | Admitting: Family Medicine

## 2015-12-02 VITALS — BP 112/78 | HR 102 | Temp 98.9°F | Resp 14 | Ht 59.5 in | Wt 142.0 lb

## 2015-12-02 DIAGNOSIS — Z0000 Encounter for general adult medical examination without abnormal findings: Secondary | ICD-10-CM

## 2015-12-02 DIAGNOSIS — I10 Essential (primary) hypertension: Secondary | ICD-10-CM

## 2015-12-02 DIAGNOSIS — Z114 Encounter for screening for human immunodeficiency virus [HIV]: Secondary | ICD-10-CM

## 2015-12-02 DIAGNOSIS — Z1159 Encounter for screening for other viral diseases: Secondary | ICD-10-CM

## 2015-12-02 LAB — HEPATITIS C ANTIBODY: HCV Ab: NEGATIVE

## 2015-12-02 LAB — BASIC METABOLIC PANEL WITH GFR
BUN: 11 mg/dL (ref 7–25)
CHLORIDE: 103 mmol/L (ref 98–110)
CO2: 27 mmol/L (ref 20–31)
CREATININE: 0.61 mg/dL (ref 0.50–1.05)
Calcium: 9.8 mg/dL (ref 8.6–10.4)
GFR, Est African American: >89 mL/min (ref >=60)
GFR, Est Non African American: >89 mL/min (ref >=60)
GLUCOSE: 88 mg/dL (ref 65–99)
Potassium: 3.8 mmol/L (ref 3.5–5.3)
Sodium: 137 mmol/L (ref 135–146)

## 2015-12-02 LAB — HIV ANTIBODY (ROUTINE TESTING W REFLEX): HIV 1&2 Ab, 4th Generation: NONREACTIVE

## 2015-12-02 MED ORDER — LISINOPRIL-HYDROCHLOROTHIAZIDE 20-25 MG PO TABS: 1.0000 | ORAL_TABLET | Freq: Every day | ORAL | Status: DC

## 2015-12-02 NOTE — Progress Notes (Signed)
Subjective:  Patient ID: Martha Manning, female    DOB: [birth year]  Age: 60 y.o. MRN: [id number]  CC: Follow-up and Hypertension   HPI Martha Manning presents for    1. CHRONIC HYPERTENSION  Disease Monitoring  Blood pressure range: not checking   Chest pain: no   Dyspnea: no   Claudication: no   Medication compliance: yes  Medication Side Effects  Lightheadedness: no   Urinary frequency: no   Edema: no     Past Surgical History  Procedure Laterality Date  . Vaginal delivery      41 yrs ago  . Joint replacement    . Total hip arthroplasty Right 10/08/2015  . Total hip arthroplasty Right 10/08/2015    Procedure: RIGHT TOTAL HIP ARTHROPLASTY ANTERIOR APPROACH;  Surgeon: Mcarthur Rossetti, MD;  Location: [facility];  Service: Orthopedics;  Laterality: Right;   Social History  Substance Use Topics  . Smoking status: Current Every Day Smoker -- 0.50 packs/day for 40 years    Types: Cigarettes  . Smokeless tobacco: Never Used  . Alcohol Use: 3.6 oz/week    6 Glasses of wine per week    Outpatient Prescriptions Prior to Visit  Medication Sig Dispense Refill  . aspirin EC 325 MG EC tablet Take 1 tablet (325 mg total) by mouth 2 (two) times daily after a meal. 30 tablet 0  . lisinopril-hydrochlorothiazide (PRINZIDE,ZESTORETIC) 20-25 MG tablet Take 1 tablet by mouth daily. Needs office visit for refills 30 tablet 0  . loratadine (ALLERGY RELIEF) 10 MG tablet Take 10 mg by mouth daily as needed for allergies or itching.    . meloxicam (MOBIC) 15 MG tablet TAKE ONE TABLET BY MOUTH ONCE DAILY (Patient taking differently: TAKE ONE TABLET BY MOUTH twice a day) 30 tablet 0  . methocarbamol (ROBAXIN) 500 MG tablet Take 1 tablet (500 mg total) by mouth every 6 (six) hours as needed for muscle spasms. 60 tablet 0  . oxyCODONE (OXY IR/ROXICODONE) 5 MG immediate release tablet Take 1-2 tablets (5-10 mg total) by mouth every 4 (four) hours as needed for severe pain or breakthrough  pain. 60 tablet 0  . Menthol-Methyl Salicylate (MUSCLE RUB) 10-15 % CREA Apply 1 application topically at bedtime as needed for muscle pain. Reported on 12/02/2015    . Multiple Vitamin (MULTIVITAMIN WITH MINERALS) TABS tablet Take 1 tablet by mouth daily as needed (for supplementation). Reported on 12/02/2015     No facility-administered medications prior to visit.    ROS Review of Systems  Constitutional: Negative for fever and chills.  Eyes: Negative for visual disturbance.  Respiratory: Negative for shortness of breath.   Cardiovascular: Negative for chest pain.  Gastrointestinal: Negative for abdominal pain and blood in stool.  Musculoskeletal: Negative for back pain and arthralgias.  Skin: Negative for rash.  Allergic/Immunologic: Negative for immunocompromised state.  Hematological: Negative for adenopathy. Does not bruise/bleed easily.  Psychiatric/Behavioral: Negative for suicidal ideas and dysphoric mood.    Objective:  BP 147/97 mmHg  Pulse 102  Temp(Src) 98.9 F (37.2 C)  Resp 14  Ht 4' 11.5" (1.511 m)  Wt 142 lb (64.411 kg)  BMI 28.21 kg/m2  SpO2 95%  BP/Weight 12/02/2015 10/11/2015 [id number]  Systolic BP [id number] -  Diastolic BP 97 77 -  Wt. (Lbs) 142 - 134  BMI 28.21 - 26.62   Pulse Readings from Last 3 Encounters:  12/02/15 102  10/11/15 106  09/30/15 101    Physical Exam  Constitutional: She  is oriented to person, place, and time. She appears well-developed and well-nourished. No distress.  HENT:  Head: Normocephalic and atraumatic.  Cardiovascular: Normal rate, regular rhythm, normal heart sounds and intact distal pulses.   Pulmonary/Chest: Effort normal and breath sounds normal.  Musculoskeletal: She exhibits no edema.  Neurological: She is alert and oriented to person, place, and time.  Skin: Skin is warm and dry. No rash noted.  Psychiatric: She has a normal mood and affect.     Assessment & Plan:   Arieyana was seen today for follow-up and  hypertension.  Diagnoses and all orders for this visit:  Essential hypertension, benign -     lisinopril-hydrochlorothiazide (PRINZIDE,ZESTORETIC) 20-25 MG tablet; Take 1 tablet by mouth daily. -     BASIC METABOLIC PANEL WITH GFR  Screening for HIV (human immunodeficiency virus) -     HIV antibody (with reflex)  Need for hepatitis C screening test -     Hepatitis C antibody, reflex  Healthcare maintenance -     MM DIGITAL SCREENING BILATERAL; Future   There are no diagnoses linked to this encounter.  No orders of the defined types were placed in this encounter.    Follow-up: No Follow-up on file.   Boykin Nearing MD

## 2015-12-02 NOTE — Addendum Note (Signed)
Addended by: Boykin Nearing on: 12/02/2015 02:34 PM   Modules accepted: Orders, SmartSet

## 2015-12-02 NOTE — Assessment & Plan Note (Signed)
At goal Compliant with medications Continue current regimen F/u in 6 months

## 2015-12-02 NOTE — Patient Instructions (Addendum)
Jenessa was seen today for follow-up and hypertension.  Diagnoses and all orders for this visit:  Essential hypertension, benign -     lisinopril-hydrochlorothiazide (PRINZIDE,ZESTORETIC) 20-25 MG tablet; Take 1 tablet by mouth daily. -     BASIC METABOLIC PANEL WITH GFR  Screening for HIV (human immunodeficiency virus) -     HIV antibody (with reflex)  Need for hepatitis C screening test -     Hepatitis C antibody, reflex  Healthcare maintenance -     MM DIGITAL SCREENING BILATERAL; Future   BP is at goal on recheck.   Smoking cessation support: smoking cessation hotline: 1-800-QUIT-NOW.  Smoking cessation classes are available through [facility]. Call [phone] or visit our website at [email or web address]   F/u in 6 months for HTN  Dr. Adrian Blackwater

## 2015-12-02 NOTE — Progress Notes (Signed)
F/U HTN Medication refill- lisinopril-hydrochlorothiazide  No pain today  No thoughts of suicide in the last 2 weeks

## 2015-12-03 ENCOUNTER — Other Ambulatory Visit: Payer: Self-pay | Admitting: Family Medicine

## 2015-12-03 DIAGNOSIS — Z1231 Encounter for screening mammogram for malignant neoplasm of breast: Secondary | ICD-10-CM

## 2015-12-18 ENCOUNTER — Telehealth: Payer: Self-pay | Admitting: *Deleted

## 2015-12-18 NOTE — Telephone Encounter (Signed)
-----   Message from Boykin Nearing, MD sent at 12/03/2015  8:59 AM EDT ----- All labs normal Screening HIV and Hep C negative

## 2015-12-18 NOTE — Telephone Encounter (Signed)
Unable to contact pt  No answer machine

## 2015-12-23 ENCOUNTER — Ambulatory Visit
Admission: RE | Admit: 2015-12-23 | Discharge: 2015-12-23 | Disposition: A | Discharge status: Home / Self Care | Payer: No Typology Code available for payment source | Source: Ambulatory Visit | Attending: Family Medicine | Admitting: Family Medicine

## 2015-12-23 DIAGNOSIS — Z1231 Encounter for screening mammogram for malignant neoplasm of breast: Secondary | ICD-10-CM

## 2016-01-06 ENCOUNTER — Ambulatory Visit: Payer: No Typology Code available for payment source

## 2016-02-03 ENCOUNTER — Ambulatory Visit: Payer: No Typology Code available for payment source | Attending: Family Medicine

## 2016-07-23 ENCOUNTER — Other Ambulatory Visit: Payer: Self-pay | Admitting: Family Medicine

## 2016-07-23 DIAGNOSIS — I10 Essential (primary) hypertension: Secondary | ICD-10-CM

## 2016-09-01 ENCOUNTER — Other Ambulatory Visit: Payer: Self-pay | Admitting: Family Medicine

## 2016-09-01 DIAGNOSIS — I10 Essential (primary) hypertension: Secondary | ICD-10-CM

## 2016-09-03 ENCOUNTER — Other Ambulatory Visit: Payer: Self-pay | Admitting: Family Medicine

## 2016-09-03 DIAGNOSIS — I10 Essential (primary) hypertension: Secondary | ICD-10-CM

## 2016-10-07 ENCOUNTER — Ambulatory Visit (INDEPENDENT_AMBULATORY_CARE_PROVIDER_SITE_OTHER): Payer: 59

## 2016-10-07 ENCOUNTER — Ambulatory Visit (INDEPENDENT_AMBULATORY_CARE_PROVIDER_SITE_OTHER): Payer: 59 | Admitting: Physician Assistant

## 2016-10-07 DIAGNOSIS — Z96641 Presence of right artificial hip joint: Secondary | ICD-10-CM | POA: Diagnosis not present

## 2016-10-07 NOTE — Progress Notes (Signed)
Office Visit Note   Patient: Martha Manning           Date of Birth: 1956/06/01           MRN: [id number] Visit Date: 10/07/2016              Requested by: Boykin Nearing, MD [address] [place], [place] [address] PCP: Nyra Market, LCSW, LCSW   Assessment & Plan: Visit Diagnoses:  1. History of right hip replacement   2. Status post total replacement of right hip     Plan: She will follow with [place] on an as-needed basis. She is given a note to give to her that dentist about antibiotics and dental procedures following a total joint replacement.  Follow-Up Instructions: Return if symptoms worsen or fail to improve.   Orders:  Orders Placed This Encounter  Procedures  . XR HIP UNILAT W OR W/O PELVIS 1V RIGHT   No orders of the defined types were placed in this encounter.     Procedures: No procedures performed   Clinical Data: No additional findings.   Subjective: No chief complaint on file.   HPI Martha Manning returns today 1 year status post right total hip arthroplasty. She states overall she is doing well. She states "best thing of ever done" in regards to the right hip replacement. He states that her surgical incision is healed well. She has no concerns in regards to the right hip. She has had some periodic low back pain but currently is having none. 'Review of Systems No Fevers chills shortness breath calf pain  Objective: Vital Signs: There were no vitals taken for this visit.  Physical Exam Well-developed well-nourished female in no acute distress. Mood and affect appropriate. Psych alert 3 Ortho Exam Right hip excellent range of motion of the right hip without pain . Relates with a nonantalgic gait and without any assistive device. Specialty Comments:  No specialty comments available.  Imaging: Xr Hip Unilat W Or W/o Pelvis 1v Right  Result Date: 10/07/2016 AP pelvis and a lateral view of the right hip: No acute fracture. Components all well  seated. No bony abnormalities. Right hips well located    PMFS History: Patient Active Problem List   Diagnosis Date Noted  . Status post total replacement of right hip 10/08/2015  . Ganglion of left wrist 05/16/2015  . Essential hypertension, benign 03/14/2014  . Osteoarthritis of right hip 03/14/2014  . Smoking 03/14/2014   Past Medical History:  Diagnosis Date  . Arthritis    "right hip" (10/08/2015)  . Hypertension     Family History  Problem Relation Age of Onset  . Hypertension Mother   . Stroke Mother   . Hypertension Sister     Past Surgical History:  Procedure Laterality Date  . JOINT REPLACEMENT    . TOTAL HIP ARTHROPLASTY Right 10/08/2015  . TOTAL HIP ARTHROPLASTY Right 10/08/2015   Procedure: RIGHT TOTAL HIP ARTHROPLASTY ANTERIOR APPROACH;  Surgeon: Mcarthur Rossetti, MD;  Location: [facility];  Service: Orthopedics;  Laterality: Right;  Marland Kitchen VAGINAL DELIVERY     41 yrs ago   Social History   Occupational History  . Not on file.   Social History Main Topics  . Smoking status: Current Every Day Smoker    Packs/day: 0.50    Years: 40.00    Types: Cigarettes  . Smokeless tobacco: Never Used  . Alcohol use 3.6 oz/week    6 Glasses of wine per week  .  Drug use: No  . Sexual activity: Not Currently

## 2016-11-27 ENCOUNTER — Telehealth: Payer: Self-pay | Admitting: Internal Medicine

## 2016-11-27 DIAGNOSIS — I10 Essential (primary) hypertension: Secondary | ICD-10-CM

## 2016-11-27 MED ORDER — LISINOPRIL-HYDROCHLOROTHIAZIDE 20-25 MG PO TABS: 1.0000 | ORAL_TABLET | Freq: Every day | ORAL | 0 refills | Status: DC

## 2016-11-27 NOTE — Telephone Encounter (Signed)
Will give one RF until seen this mth.

## 2016-11-27 NOTE — Telephone Encounter (Signed)
LVM informing pt of medication being refilled for one month

## 2016-11-27 NOTE — Telephone Encounter (Signed)
PT called to request a refill for lisinopril-hydrochlorothiazide (PRINZIDE,ZESTORETIC) 20-25 MG tablet   Since she don't have any and her appt is for the end of June and she need the med, please follow up

## 2016-12-16 ENCOUNTER — Other Ambulatory Visit: Payer: Self-pay | Admitting: Family Medicine

## 2016-12-16 DIAGNOSIS — Z1231 Encounter for screening mammogram for malignant neoplasm of breast: Secondary | ICD-10-CM

## 2016-12-21 ENCOUNTER — Encounter: Payer: Self-pay | Admitting: Internal Medicine

## 2016-12-21 ENCOUNTER — Ambulatory Visit: Payer: 59 | Attending: Internal Medicine | Admitting: Internal Medicine

## 2016-12-21 VITALS — BP 143/92 | HR 79 | Temp 98.8°F | Resp 16 | Wt 143.4 lb

## 2016-12-21 DIAGNOSIS — Z96641 Presence of right artificial hip joint: Secondary | ICD-10-CM | POA: Diagnosis not present

## 2016-12-21 DIAGNOSIS — Z131 Encounter for screening for diabetes mellitus: Secondary | ICD-10-CM | POA: Diagnosis not present

## 2016-12-21 DIAGNOSIS — Z79899 Other long term (current) drug therapy: Secondary | ICD-10-CM | POA: Diagnosis not present

## 2016-12-21 DIAGNOSIS — M67432 Ganglion, left wrist: Secondary | ICD-10-CM | POA: Diagnosis not present

## 2016-12-21 DIAGNOSIS — Z1211 Encounter for screening for malignant neoplasm of colon: Secondary | ICD-10-CM | POA: Insufficient documentation

## 2016-12-21 DIAGNOSIS — Z9889 Other specified postprocedural states: Secondary | ICD-10-CM | POA: Insufficient documentation

## 2016-12-21 DIAGNOSIS — G4700 Insomnia, unspecified: Secondary | ICD-10-CM | POA: Insufficient documentation

## 2016-12-21 DIAGNOSIS — M1611 Unilateral primary osteoarthritis, right hip: Secondary | ICD-10-CM | POA: Insufficient documentation

## 2016-12-21 DIAGNOSIS — I10 Essential (primary) hypertension: Secondary | ICD-10-CM | POA: Insufficient documentation

## 2016-12-21 DIAGNOSIS — F17210 Nicotine dependence, cigarettes, uncomplicated: Secondary | ICD-10-CM | POA: Insufficient documentation

## 2016-12-21 DIAGNOSIS — Z7982 Long term (current) use of aspirin: Secondary | ICD-10-CM | POA: Insufficient documentation

## 2016-12-21 DIAGNOSIS — F17200 Nicotine dependence, unspecified, uncomplicated: Secondary | ICD-10-CM

## 2016-12-21 LAB — POCT GLYCOSYLATED HEMOGLOBIN (HGB A1C): Hemoglobin A1C: 5.4

## 2016-12-21 MED ORDER — LISINOPRIL-HYDROCHLOROTHIAZIDE 20-25 MG PO TABS: 1.0000 | ORAL_TABLET | Freq: Every day | ORAL | 3 refills | Status: DC

## 2016-12-21 MED ORDER — TRAZODONE HCL 50 MG PO TABS: 25.0000 mg | ORAL_TABLET | Freq: Every evening | ORAL | 3 refills | Status: DC | PRN

## 2016-12-21 NOTE — Progress Notes (Signed)
Patient ID: RUMOR SUN, female    DOB: 05/09/1956  MRN: [id number]  CC: re-estabklish and Medication Refill   Subjective: Martha Manning is a 61 y.o. female who presents for follow-up visit. PCP was Dr. Adrian Blackwater Her concerns today include:  61 year old female with history of HTN, tobacco dependence, OA of right hip status post THA.  1. Tob dep: smoked since age 33.  -Patient not ready to quit. "I enjoy smoking." She is aware of health risks associated with smoking.  HYPERTENSION Currently taking: see medication list Med Adherence: [x]  Yes    []  No Medication side effects: []  Yes    [x]  No Adherence with salt restriction: [x]  Yes    []  No Home Monitoring?: []  Yes    [x]  No Monitoring Frequency: []  Yes    []  No Home BP results range: []  Yes    []  No SOB? []  Yes    [x]  No Chest Pain?: []  Yes    [x]  No Leg swelling?: []  Yes    [x]  No Headaches?: [x]  Yes    []  No Dizziness? []  Yes    [x]  No Comments: eating more meat   Working out 3 x a wk.  She feels her blood pressure is not optimal because she is not sleeping well at nights.   3.  Insomnia:   -Requesting medication to help her sleep better at nights Not sleeping well for 3 yrs.  "I toss and turn all night."   -thinks hip pain contributed to this before surgery but it has continued post THR -"I don't have the relax sleep."  -Gets in bed between 9-10 p.m 2 get up at 5:15 a.m for work.  -Turns off or lights and sounds once in bed.  Falls alseep for 45 mins-hr then wakes up for a period. She lays there and thinks about things. Then tries to go back to sleep. This cycle repeats itself several times during the night. -no coffee.  May drink a glass of wine about 8 p.m -use to snore but subsided in past 6 mths. -She wakes up feeling unrefreshed   -A lot of tension in neck which she tries to massage -no falling asleep when driving  -tried Melatonin OTC but makes her too groggy during the day  4.  Filling fell out this  wkend -plan to schedule with dentist  5. HM:  Needs colonoscopy. Reports having had cholesterol and diabetes screen in April of this year through her job. States that both were normal.  Patient Active Problem List   Diagnosis Date Noted  . Diabetes mellitus screening 12/21/2016  . Status post total replacement of right hip 10/08/2015  . Ganglion of left wrist 05/16/2015  . Essential hypertension 03/14/2014  . Osteoarthritis of right hip 03/14/2014  . Smoking 03/14/2014     Current Outpatient Prescriptions on File Prior to Visit  Medication Sig Dispense Refill  . meloxicam (MOBIC) 15 MG tablet TAKE ONE TABLET BY MOUTH ONCE DAILY (Patient taking differently: TAKE ONE TABLET BY MOUTH twice a day) 30 tablet 0  . methocarbamol (ROBAXIN) 500 MG tablet Take 1 tablet (500 mg total) by mouth every 6 (six) hours as needed for muscle spasms. 60 tablet 0  . aspirin EC 325 MG EC tablet Take 1 tablet (325 mg total) by mouth 2 (two) times daily after a meal. (Patient not taking: Reported on 12/21/2016) 30 tablet 0  . loratadine (ALLERGY RELIEF) 10 MG tablet Take 10 mg by mouth daily as needed  for allergies or itching.    . Multiple Vitamin (MULTIVITAMIN WITH MINERALS) TABS tablet Take 1 tablet by mouth daily as needed (for supplementation). Reported on 12/02/2015     No current facility-administered medications on file prior to visit.     No Known Allergies  Social History   Social History  . Marital status: Divorced    Spouse name: N/A  . Number of children: N/A  . Years of education: N/A   Occupational History  . Not on file.   Social History Main Topics  . Smoking status: Current Every Day Smoker    Packs/day: 0.50    Years: 40.00    Types: Cigarettes  . Smokeless tobacco: Never Used  . Alcohol use 3.6 oz/week    6 Glasses of wine per week  . Drug use: No  . Sexual activity: Not Currently   Other Topics Concern  . Not on file   Social History Narrative  . No narrative on file     Family History  Problem Relation Age of Onset  . Hypertension Mother   . Stroke Mother   . Hypertension Sister     Past Surgical History:  Procedure Laterality Date  . JOINT REPLACEMENT    . TOTAL HIP ARTHROPLASTY Right 10/08/2015  . TOTAL HIP ARTHROPLASTY Right 10/08/2015   Procedure: RIGHT TOTAL HIP ARTHROPLASTY ANTERIOR APPROACH;  Surgeon: Mcarthur Rossetti, MD;  Location: [facility];  Service: Orthopedics;  Laterality: Right;  Marland Kitchen VAGINAL DELIVERY     41 yrs ago    ROS: Review of Systems As stated above  PHYSICAL EXAM: BP (!) 143/92   Pulse 79   Temp 98.8 F (37.1 C) (Oral)   Resp 16   Wt 143 lb 6.4 oz (65 kg)   SpO2 99%   BMI 28.48 kg/m   Repeat BP 140/90 Wt Readings from Last 3 Encounters:  12/21/16 143 lb 6.4 oz (65 kg)  12/02/15 142 lb (64.4 kg)  10/08/15 134 lb (60.8 kg)  Physical Exam  General appearance - alert, well appearing, and in no distress Mental status - alert, oriented to person, place, and time, normal mood, behavior, speech, dress, motor activity, and thought processes Mouth - mucous membranes moist, pharynx normal without lesions. Filling absent from second molar left lower jaw Neck - supple, no significant adenopathy Chest - clear to auscultation, no wheezes, rales or rhonchi, symmetric air entry Heart - normal rate, regular rhythm, normal S1, S2, no murmurs, rubs, clicks or gallops Extremities - peripheral pulses normal, no pedal edema, no clubbing or cyanosis Results for orders placed or performed in visit on 12/21/16  POCT glycosylated hemoglobin (Hb A1C)  Result Value Ref Range   Hemoglobin A1C 5.4    Depression screen [facility] 2/9 12/21/2016 12/02/2015 05/16/2015 06/14/2014  Decreased Interest 0 0 0 0  Down, Depressed, Hopeless 0 0 0 0  PHQ - 2 Score 0 0 0 0    ASSESSMENT AND PLAN: 1. Essential hypertension At goal on repeat blood pressure check. Continue current medication. Continue low-salt diet and regular exercise as she has been  doing. - Comprehensive metabolic panel - CBC - lisinopril-hydrochlorothiazide (PRINZIDE,ZESTORETIC) 20-25 MG tablet; Take 1 tablet by mouth daily.  Dispense: 90 tablet; Refill: 3  2. Insomnia, unspecified type Good sleep hygiene discussed including getting in bed around about the same time every night, turning off or lights and sounds, avoiding caffeinated beverages and excess alcohol before bedtime. -Will try her with low dose trazodone - traZODone (DESYREL)  50 MG tablet; Take 0.5-1 tablets (25-50 mg total) by mouth at bedtime as needed for sleep.  Dispense: 30 tablet; Refill: 3  3. Diabetes mellitus screening Not diabetic - POCT glycosylated hemoglobin (Hb A1C)  4. Tobacco dependence Patient advised to quit smoking. Discussed health risks associated with smoking including lung and other types of cancers, chronic lung diseases and CV risks.. Pt does not want to quit smoking  5. Colon cancer screening - Ambulatory referral to Gastroenterology  Patient plans to schedule an appointment with a dentist to have dental issue addressed. Patient was given the opportunity to ask questions.  Patient verbalized understanding of the plan and was able to repeat key elements of the plan.   Orders Placed This Encounter  Procedures  . Comprehensive metabolic panel  . CBC  . Ambulatory referral to Gastroenterology  . POCT glycosylated hemoglobin (Hb A1C)     Requested Prescriptions   Signed Prescriptions Disp Refills  . lisinopril-hydrochlorothiazide (PRINZIDE,ZESTORETIC) 20-25 MG tablet 90 tablet 3    Sig: Take 1 tablet by mouth daily.  . traZODone (DESYREL) 50 MG tablet 30 tablet 3    Sig: Take 0.5-1 tablets (25-50 mg total) by mouth at bedtime as needed for sleep.    Return in about 4 months (around 04/22/2017).  Karle Plumber, MD, FACP

## 2016-12-21 NOTE — Patient Instructions (Signed)

## 2016-12-22 ENCOUNTER — Encounter: Payer: Self-pay | Admitting: Internal Medicine

## 2016-12-22 LAB — COMPREHENSIVE METABOLIC PANEL
ALT: 8 IU/L (ref 0–32)
AST: 10 IU/L (ref 0–40)
Albumin/Globulin Ratio: 1.7 (ref 1.2–2.2)
Albumin: 4.4 g/dL (ref 3.5–5.5)
Alkaline Phosphatase: 77 IU/L (ref 39–117)
BUN/Creatinine Ratio: 13 (ref 9–23)
BUN: 9 mg/dL (ref 6–24)
Bilirubin Total: 0.3 mg/dL (ref 0.0–1.2)
CALCIUM: 10.4 mg/dL — AB (ref 8.7–10.2)
CO2: 24 mmol/L (ref 20–29)
Chloride: 100 mmol/L (ref 96–106)
Creatinine, Ser: 0.68 mg/dL (ref 0.57–1.00)
GFR, EST AFRICAN AMERICAN: 111 mL/min/{1.73_m2} (ref >59)
GFR, EST NON AFRICAN AMERICAN: 96 mL/min/{1.73_m2} (ref >59)
GLUCOSE: 85 mg/dL (ref 65–99)
Globulin, Total: 2.6 g/dL (ref 1.5–4.5)
Potassium: 4.3 mmol/L (ref 3.5–5.2)
Sodium: 138 mmol/L (ref 134–144)
Total Protein: 7 g/dL (ref 6.0–8.5)

## 2016-12-22 LAB — CBC
HEMATOCRIT: 38.9 % (ref 34.0–46.6)
HEMOGLOBIN: 12.3 g/dL (ref 11.1–15.9)
MCH: 24.7 pg — AB (ref 26.6–33.0)
MCHC: 31.6 g/dL (ref 31.5–35.7)
MCV: 78 fL — ABNORMAL LOW (ref 79–97)
Platelets: 390 10*3/uL — ABNORMAL HIGH (ref 150–379)
RBC: 4.98 x10E6/uL (ref 3.77–5.28)
RDW: 16.4 % — ABNORMAL HIGH (ref 12.3–15.4)
WBC: 6.2 10*3/uL (ref 3.4–10.8)

## 2016-12-22 NOTE — Progress Notes (Signed)
Labs reviewed. Mild elevation in calcium level. Can be repeated on future visit. Patient had microcytic anemia last year. H/H have normalized but MCV is still low suggesting possible sickle cell trait or thalassemia trait. Can check for this on a follow-up visit.

## 2016-12-23 ENCOUNTER — Telehealth: Payer: Self-pay

## 2016-12-23 NOTE — Telephone Encounter (Signed)
Contacted pt to go over lab results pt didn't answer lvm asking pt to give me a call at her earliest convenience   If pt calls back please results: kidney and liver function tests were normal. Mild elevation in calcium level that can be rechecked on her next visit. One year ago she had anemia but repeat blood tests on this visit show that it has corrected.

## 2017-01-04 ENCOUNTER — Ambulatory Visit
Admission: RE | Admit: 2017-01-04 | Discharge: 2017-01-04 | Disposition: A | Discharge status: Home / Self Care | Payer: 59 | Source: Ambulatory Visit | Attending: Family Medicine | Admitting: Family Medicine

## 2017-01-04 DIAGNOSIS — Z1231 Encounter for screening mammogram for malignant neoplasm of breast: Secondary | ICD-10-CM | POA: Diagnosis not present

## 2017-03-02 ENCOUNTER — Encounter: Payer: Self-pay | Admitting: Gastroenterology

## 2017-03-08 ENCOUNTER — Ambulatory Visit (AMBULATORY_SURGERY_CENTER): Payer: Self-pay | Admitting: *Deleted

## 2017-03-08 VITALS — Ht 59.0 in | Wt 142.0 lb

## 2017-03-08 DIAGNOSIS — Z1211 Encounter for screening for malignant neoplasm of colon: Secondary | ICD-10-CM

## 2017-03-08 MED ORDER — NA SULFATE-K SULFATE-MG SULF 17.5-3.13-1.6 GM/177ML PO SOLN: 1.0000 | Freq: Once | ORAL | 0 refills | Status: AC

## 2017-03-08 NOTE — Progress Notes (Signed)
No egg or soy allergy known to patient  No issues with past sedation with any surgeries  or procedures, no intubation problems  No diet pills per patient No home 02 use per patient  No blood thinners per patient  Pt denies issues with constipation - states occ has hard stools but not ofter No A fib or A flutter  EMMI video sent to pt's e mail

## 2017-03-09 ENCOUNTER — Telehealth: Payer: Self-pay | Admitting: Gastroenterology

## 2017-03-09 NOTE — Telephone Encounter (Signed)
[facility] and asked them to run the [facility] than 50.00 coupon, it was accepted. Left message on patients voicemail with name identifier that coupon had been run and that rx was available for her to pick up at the cost of $50.00

## 2017-03-10 ENCOUNTER — Encounter: Payer: Self-pay | Admitting: Gastroenterology

## 2017-03-22 ENCOUNTER — Encounter: Payer: Self-pay | Admitting: Gastroenterology

## 2017-03-22 ENCOUNTER — Ambulatory Visit (AMBULATORY_SURGERY_CENTER): Payer: 59 | Admitting: Gastroenterology

## 2017-03-22 VITALS — BP 124/83 | HR 80 | Temp 96.6°F | Resp 20 | Ht 59.0 in | Wt 142.0 lb

## 2017-03-22 DIAGNOSIS — D129 Benign neoplasm of anus and anal canal: Secondary | ICD-10-CM

## 2017-03-22 DIAGNOSIS — D128 Benign neoplasm of rectum: Secondary | ICD-10-CM

## 2017-03-22 DIAGNOSIS — Z1211 Encounter for screening for malignant neoplasm of colon: Secondary | ICD-10-CM

## 2017-03-22 DIAGNOSIS — Z1212 Encounter for screening for malignant neoplasm of rectum: Secondary | ICD-10-CM | POA: Diagnosis not present

## 2017-03-22 DIAGNOSIS — K621 Rectal polyp: Secondary | ICD-10-CM | POA: Diagnosis not present

## 2017-03-22 MED ORDER — SODIUM CHLORIDE 0.9 % IV SOLN: 500.0000 mL | INTRAVENOUS | Status: DC

## 2017-03-22 NOTE — Progress Notes (Signed)
Report given to PACU, vss 

## 2017-03-22 NOTE — Progress Notes (Signed)
Called to room to assist during endoscopic procedure.  Patient ID and intended procedure confirmed with present staff. Received instructions for my participation in the procedure from the performing physician.  

## 2017-03-22 NOTE — Progress Notes (Signed)
Pt's states no medical or surgical changes since previsit or office visit. maw 

## 2017-03-22 NOTE — Op Note (Signed)
[place] Patient Name: Martha Manning Procedure Date: 03/22/2017 2:28 PM MRN: [id number] Endoscopist: Mauri Pole , MD Age: 61 Referring MD:  Date of Birth: 09/13/1955 Gender: Female Account #: [id number] Procedure:                Colonoscopy Indications:              Screening for colorectal malignant neoplasm Medicines:                Monitored Anesthesia Care Procedure:                Pre-Anesthesia Assessment:                           - Prior to the procedure, a History and Physical                            was performed, and patient medications and                            allergies were reviewed. The patient's tolerance of                            previous anesthesia was also reviewed. The risks                            and benefits of the procedure and the sedation                            options and risks were discussed with the patient.                            All questions were answered, and informed consent                            was obtained. Prior Anticoagulants: The patient has                            taken no previous anticoagulant or antiplatelet                            agents. ASA Grade Assessment: II - A patient with                            mild systemic disease. After reviewing the risks                            and benefits, the patient was deemed in                            satisfactory condition to undergo the procedure.                           After obtaining informed consent, the colonoscope  was passed under direct vision. Throughout the                            procedure, the patient's blood pressure, pulse, and                            oxygen saturations were monitored continuously. The                            Colonoscope was introduced through the anus and                            advanced to the the cecum, identified by                            appendiceal orifice  and ileocecal valve. The                            colonoscopy was performed without difficulty. The                            patient tolerated the procedure well. The quality                            of the bowel preparation was excellent. The                            ileocecal valve, appendiceal orifice, and rectum                            were photographed. Scope In: 2:36:23 PM Scope Out: 2:51:25 PM Scope Withdrawal Time: 0 hours 9 minutes 41 seconds  Total Procedure Duration: 0 hours 15 minutes 2 seconds  Findings:                 The perianal and digital rectal examinations were                            normal.                           A 3 mm polyp was found in the rectum. The polyp was                            sessile. The polyp was removed with a cold biopsy                            forceps. Resection and retrieval were complete.                           Non-bleeding internal hemorrhoids were found during                            retroflexion. The hemorrhoids were small.  The exam was otherwise without abnormality. Complications:            No immediate complications. Estimated Blood Loss:     Estimated blood loss was minimal. Impression:               - One 3 mm polyp in the rectum, removed with a cold                            biopsy forceps. Resected and retrieved.                           - Non-bleeding internal hemorrhoids.                           - The examination was otherwise normal. Recommendation:           - Patient has a contact number available for                            emergencies. The signs and symptoms of potential                            delayed complications were discussed with the                            patient. Return to normal activities tomorrow.                            Written discharge instructions were provided to the                            patient.                           - Resume  previous diet.                           - Continue present medications.                           - Await pathology results.                           - Repeat colonoscopy in 5-10 years for surveillance                            based on pathology results. Mauri Pole, MD 03/22/2017 2:56:41 PM This report has been signed electronically.

## 2017-03-22 NOTE — Patient Instructions (Signed)
YOU HAD AN ENDOSCOPIC PROCEDURE TODAY AT THE [facility]:   Refer to the procedure report that was given to you for any specific questions about what was found during the examination.  If the procedure report does not answer your questions, please call your gastroenterologist to clarify.  If you requested that your care partner not be given the details of your procedure findings, then the procedure report has been included in a sealed envelope for you to review at your convenience later.  YOU SHOULD EXPECT: Some feelings of bloating in the abdomen. Passage of more gas than usual.  Walking can help get rid of the air that was put into your GI tract during the procedure and reduce the bloating. If you had a lower endoscopy (such as a colonoscopy or flexible sigmoidoscopy) you may notice spotting of blood in your stool or on the toilet paper. If you underwent a bowel prep for your procedure, you may not have a normal bowel movement for a few days.  Please Note:  You might notice some irritation and congestion in your nose or some drainage.  This is from the oxygen used during your procedure.  There is no need for concern and it should clear up in a day or so.  SYMPTOMS TO REPORT IMMEDIATELY:   Following lower endoscopy (colonoscopy or flexible sigmoidoscopy):  Excessive amounts of blood in the stool  Significant tenderness or worsening of abdominal pains  Swelling of the abdomen that is new, acute  Fever of 100F or higher  For urgent or emergent issues, a gastroenterologist can be reached at any hour by calling [phone].   DIET:  We do recommend a small meal at first, but then you may proceed to your regular diet.  Drink plenty of fluids but you should avoid alcoholic beverages for 24 hours.  ACTIVITY:  You should plan to take it easy for the rest of today and you should NOT DRIVE or use heavy machinery until tomorrow (because of the sedation medicines used during the test).     FOLLOW UP: Our staff will call the number listed on your records the next business day following your procedure to check on you and address any questions or concerns that you may have regarding the information given to you following your procedure. If we do not reach you, we will leave a message.  However, if you are feeling well and you are not experiencing any problems, there is no need to return our call.  We will assume that you have returned to your regular daily activities without incident.  If any biopsies were taken you will be contacted by phone or by letter within the next 1-3 weeks.  Please call us at [phone] if you have not heard about the biopsies in 3 weeks.   Await for biopsy results to determine next repeat Colonoscopy screening Polyps (handout given) Hemorrhoids (handout given)   SIGNATURES/CONFIDENTIALITY: You and/or your care partner have signed paperwork which will be entered into your electronic medical record.  These signatures attest to the fact that that the information above on your After Visit Summary has been reviewed and is understood.  Full responsibility of the confidentiality of this discharge information lies with you and/or your care-partner. 

## 2017-03-23 ENCOUNTER — Telehealth: Payer: Self-pay | Admitting: *Deleted

## 2017-03-23 NOTE — Telephone Encounter (Signed)
  Follow up Call-  Call back number 03/22/2017  Post procedure Call Back phone  # [phone] cell  Permission to leave phone message Yes  Some recent data might be hidden     Patient questions:   Message left to call us if necessary.  Second call.

## 2017-03-23 NOTE — Telephone Encounter (Signed)
No answer, message left for the patient. 

## 2017-03-23 NOTE — Telephone Encounter (Signed)
Patient has felt more "emotional " today. Has cried once. Not her normal reaction. Discussed the stress of her procedure, diet change, change of sleep pattern and menopausal symptoms. She will "give it a couple of days" but okay to call back if she doesn't feel better.

## 2017-03-23 NOTE — Telephone Encounter (Signed)
Pt states she has some concerns after colonoscopy done yesterday states she has been very sensitive and it is very unusual for her best call back# [phone].

## 2017-03-29 ENCOUNTER — Encounter: Payer: Self-pay | Admitting: Gastroenterology

## 2017-04-09 ENCOUNTER — Telehealth: Payer: Self-pay | Admitting: Internal Medicine

## 2017-04-09 DIAGNOSIS — G4700 Insomnia, unspecified: Secondary | ICD-10-CM

## 2017-04-09 DIAGNOSIS — I10 Essential (primary) hypertension: Secondary | ICD-10-CM

## 2017-04-09 MED ORDER — TRAZODONE HCL 50 MG PO TABS: 25.0000 mg | ORAL_TABLET | Freq: Every evening | ORAL | 0 refills | Status: DC | PRN

## 2017-04-09 MED ORDER — LISINOPRIL-HYDROCHLOROTHIAZIDE 20-25 MG PO TABS: 1.0000 | ORAL_TABLET | Freq: Every day | ORAL | 0 refills | Status: DC

## 2017-04-09 NOTE — Telephone Encounter (Signed)
Patient called stating [facility] on [address] is currently without power and pt has been out of medication since yesterday . Pt would like new rx sent to [facility] on [address]. Please f/up

## 2017-04-09 NOTE — Telephone Encounter (Signed)
30 day supplies sent to [facility] on [address] ([place])

## 2017-10-26 ENCOUNTER — Other Ambulatory Visit (HOSPITAL_COMMUNITY)
Admission: RE | Admit: 2017-10-26 | Discharge: 2017-10-26 | Disposition: A | Discharge status: Home / Self Care | Payer: 59 | Source: Ambulatory Visit | Attending: Internal Medicine | Admitting: Internal Medicine

## 2017-10-26 ENCOUNTER — Encounter: Payer: Self-pay | Admitting: Internal Medicine

## 2017-10-26 ENCOUNTER — Ambulatory Visit: Payer: 59 | Attending: Internal Medicine | Admitting: Internal Medicine

## 2017-10-26 VITALS — BP 148/105 | HR 93 | Temp 98.2°F | Resp 16 | Wt 146.0 lb

## 2017-10-26 DIAGNOSIS — Z79899 Other long term (current) drug therapy: Secondary | ICD-10-CM | POA: Diagnosis not present

## 2017-10-26 DIAGNOSIS — Z124 Encounter for screening for malignant neoplasm of cervix: Secondary | ICD-10-CM | POA: Insufficient documentation

## 2017-10-26 DIAGNOSIS — I10 Essential (primary) hypertension: Secondary | ICD-10-CM | POA: Insufficient documentation

## 2017-10-26 DIAGNOSIS — F5101 Primary insomnia: Secondary | ICD-10-CM

## 2017-10-26 DIAGNOSIS — F17210 Nicotine dependence, cigarettes, uncomplicated: Secondary | ICD-10-CM | POA: Insufficient documentation

## 2017-10-26 DIAGNOSIS — Z7982 Long term (current) use of aspirin: Secondary | ICD-10-CM | POA: Diagnosis not present

## 2017-10-26 DIAGNOSIS — F17200 Nicotine dependence, unspecified, uncomplicated: Secondary | ICD-10-CM

## 2017-10-26 DIAGNOSIS — M1611 Unilateral primary osteoarthritis, right hip: Secondary | ICD-10-CM | POA: Diagnosis not present

## 2017-10-26 DIAGNOSIS — Z01419 Encounter for gynecological examination (general) (routine) without abnormal findings: Secondary | ICD-10-CM | POA: Diagnosis not present

## 2017-10-26 DIAGNOSIS — R87810 Cervical high risk human papillomavirus (HPV) DNA test positive: Secondary | ICD-10-CM | POA: Insufficient documentation

## 2017-10-26 MED ORDER — AMLODIPINE BESYLATE 5 MG PO TABS: 5.0000 mg | ORAL_TABLET | Freq: Every day | ORAL | 3 refills | Status: DC

## 2017-10-26 MED ORDER — HYDROXYZINE HCL 25 MG PO TABS: ORAL_TABLET | ORAL | 2 refills | Status: DC

## 2017-10-26 NOTE — Progress Notes (Signed)
Pt states she does currently have a headache

## 2017-10-26 NOTE — Progress Notes (Signed)
Patient ID: Martha Manning, female    DOB: 04-Jul-1955  MRN: [id number]  CC: Gynecologic Exam and Hypertension   Subjective: Martha Manning is a 62 y.o. 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  1.  HTN: compliant with Lis/HCTZ; needs to do better with limiting salt No chest pains or shortness of breath at rest on exertion.  She exercises 2 to 3 days a week for an hour  2.  Tob: at 7 cig/day.  Not wanting to quit.  She states that she looks smoking and is just not ready to give it as yet.  She is aware of the health risks associated with cigarette smoking.  3. Last pap 2015. Abnormal once in her 30s -no vaginal discgh or irritation at this time. -No as sexually "active as I want to be."  She attributes this more when being a suitable mate.  She is postmenopausal. She is up-to-date with her mammogram.-  4.  Insomnia -prescribed trazodone last visit.  Patient reports that she is able to sleep about 4 hours when she takes the medication and then is up the rest of the night.  She is observing good sleep hygiene practices including getting in bed around the same time every night, turning off all lights and sounds, and not drinking caffeinated beverages in the evenings.  However she does drink a glass of red wine every night.    She brings a copy of recent tests that were done at her health screening on 10/14/2017.  Blood pressure was 139/93. Blood glucose was 93. Total cholesterol 195, HDL cholesterol 70, LDL cholesterol 99, triglycerides 129. AST and ALT were 21 and 17   Patient Active Problem List   Diagnosis Date Noted  . Diabetes mellitus screening 12/21/2016  . Status post total replacement of right hip 10/08/2015  . Ganglion of left wrist 05/16/2015  . Essential hypertension 03/14/2014  . Osteoarthritis of right hip 03/14/2014  . Smoking  03/14/2014     Current Outpatient Medications on File Prior to Visit  Medication Sig Dispense Refill  . aspirin 81 MG chewable tablet Chew 81 mg by mouth daily.    . Aspirin-Acetaminophen-Caffeine (GOODY HEADACHE PO) Take by mouth as needed.    Marland Kitchen ibuprofen (ADVIL,MOTRIN) 200 MG tablet Take 200 mg by mouth every 6 (six) hours as needed.    Marland Kitchen lisinopril-hydrochlorothiazide (PRINZIDE,ZESTORETIC) 20-25 MG tablet Take 1 tablet by mouth daily. 30 tablet 0  . loratadine (ALLERGY RELIEF) 10 MG tablet Take 10 mg by mouth daily as needed for allergies or itching.    . meloxicam (MOBIC) 15 MG tablet TAKE ONE TABLET BY MOUTH ONCE DAILY (Patient taking differently: TAKE ONE TABLET BY MOUTH twice a day) 30 tablet 0  . Multiple Vitamin (MULTIVITAMIN WITH MINERALS) TABS tablet Take 1 tablet by mouth daily as needed (for supplementation). Reported on 12/02/2015    . traZODone (DESYREL) 50 MG tablet Take 0.5-1 tablets (25-50 mg total) by mouth at bedtime as needed for sleep. 30 tablet 0   No current facility-administered medications on file prior to visit.     No Known Allergies  Social History   Socioeconomic History  . Marital status: Divorced    Spouse name: Not on file  . Number of children: Not on file  . Years of education: Not on file  . Highest education level: Not on  file  Occupational History  . Not on file  Social Needs  . Financial resource strain: Not on file  . Food insecurity:    Worry: Not on file    Inability: Not on file  . Transportation needs:    Medical: Not on file    Non-medical: Not on file  Tobacco Use  . Smoking status: Current Every Day Smoker    Packs/day: 0.50    Years: 40.00    Pack years: 20.00    Types: Cigarettes  . Smokeless tobacco: Never Used  . Tobacco comment: 7 cigs a day   Substance and Sexual Activity  . Alcohol use: Yes    Alcohol/week: 4.2 oz    Types: 7 Glasses of wine per week    Comment: 7 glasses of wine per week, one per day  . Drug use:  No  . Sexual activity: Not Currently  Lifestyle  . Physical activity:    Days per week: Not on file    Minutes per session: Not on file  . Stress: Not on file  Relationships  . Social connections:    Talks on phone: Not on file    Gets together: Not on file    Attends religious service: Not on file    Active member of club or organization: Not on file    Attends meetings of clubs or organizations: Not on file    Relationship status: Not on file  . Intimate partner violence:    Fear of current or ex partner: Not on file    Emotionally abused: Not on file    Physically abused: Not on file    Forced sexual activity: Not on file  Other Topics Concern  . Not on file  Social History Narrative  . Not on file    Family History  Problem Relation Age of Onset  . Hypertension Mother   . Stroke Mother   . Hypertension Sister   . Colon cancer Neg Hx   . Esophageal cancer Neg Hx   . Rectal cancer Neg Hx   . Stomach cancer Neg Hx   . Pancreatic cancer Neg Hx   . Prostate cancer Neg Hx     Past Surgical History:  Procedure Laterality Date  . DENTAL SURGERY    . TOTAL HIP ARTHROPLASTY Right 10/08/2015   Procedure: RIGHT TOTAL HIP ARTHROPLASTY ANTERIOR APPROACH;  Surgeon: Mcarthur Rossetti, MD;  Location: [facility];  Service: Orthopedics;  Laterality: Right;  Marland Kitchen VAGINAL DELIVERY     41 yrs ago    ROS: Review of Systems Negative except as stated above PHYSICAL EXAM: BP (!) 148/105   Pulse 93   Temp 98.2 F (36.8 C) (Oral)   Resp 16   Wt 146 lb (66.2 kg)   SpO2 99%   BMI 29.49 kg/m   Wt Readings from Last 3 Encounters:  10/26/17 146 lb (66.2 kg)  03/22/17 142 lb (64.4 kg)  03/08/17 142 lb (64.4 kg)    Physical Exam  General appearance - alert, well appearing, and in no distress Mental status - alert, oriented to person, place, and time, normal mood, behavior, speech, dress, motor activity, and thought processes Eyes - pupils equal and reactive, extraocular eye  movements intact Mouth - mucous membranes moist, pharynx normal without lesions Neck - supple, no significant adenopathy Chest - clear to auscultation, no wheezes, rales or rhonchi, symmetric air entry Heart - normal rate, regular rhythm, normal S1, S2, no murmurs, rubs, clicks or gallops  Breasts - breasts appear normal, no suspicious masses, no skin or nipple changes or axillary nodes Pelvic -CMA Magdalen Spatz present:  normal external genitalia, vulva.  mild vagina atrophy. Cervical oz mildly stenosed;  uterus and adnexa normal   ASSESSMENT AND PLAN: 1. Essential hypertension Not at goal. Limit salt in foods Continue regular exercise Add Norvasc - amLODipine (NORVASC) 5 MG tablet; Take 1 tablet (5 mg total) by mouth daily.  Dispense: 90 tablet; Refill: 3  2. Tobacco dependence Patient advised to quit smoking. Discussed health risks associated with smoking including lung and other types of cancers, chronic lung diseases and CV risks.. Pt not ready to give trail of quitting.   Less than 5 mins spent on counseling   3. Primary insomnia -continue good sleep hygiene.   Stop Trazodone Trail of Hydroxyzine  4. Pap smear for cervical cancer screening - Cytology - PAP   Patient was given the opportunity to ask questions.  Patient verbalized understanding of the plan and was able to repeat key elements of the plan.   No orders of the defined types were placed in this encounter.    Requested Prescriptions   Signed Prescriptions Disp Refills  . amLODipine (NORVASC) 5 MG tablet 90 tablet 3    Sig: Take 1 tablet (5 mg total) by mouth daily.  . hydrOXYzine (ATARAX/VISTARIL) 25 MG tablet 30 tablet 2    Sig: 1-2 tabs PO PRN at bedtime    Return in about 4 months (around 02/25/2018).  Karle Plumber, MD, FACP

## 2017-10-26 NOTE — Patient Instructions (Signed)
Start Amlodipine to help control blood pressure.  Check blood pressure 2-3 times a week. Goal is 130/80 or lower.

## 2017-11-01 LAB — CYTOLOGY - PAP
CHLAMYDIA, DNA PROBE: NEGATIVE
Diagnosis: NEGATIVE
HPV (WINDOPATH): DETECTED — AB
HPV 16/18/45 GENOTYPING: NEGATIVE
Neisseria Gonorrhea: NEGATIVE
TRICH (WINDOWPATH): NEGATIVE

## 2017-11-05 ENCOUNTER — Telehealth: Payer: Self-pay

## 2017-11-05 NOTE — Telephone Encounter (Signed)
Contacted pt to go over pap results pt didn't answer lvm asking pt to give me a call at her earliest convenience  If pt calls back please give results: Pap smear was negative for any abnormal cells. 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 She will need repeat Pap smear in 3 years.

## 2017-11-29 ENCOUNTER — Telehealth (INDEPENDENT_AMBULATORY_CARE_PROVIDER_SITE_OTHER): Payer: Self-pay | Admitting: Orthopaedic Surgery

## 2017-11-29 ENCOUNTER — Other Ambulatory Visit (INDEPENDENT_AMBULATORY_CARE_PROVIDER_SITE_OTHER): Payer: Self-pay

## 2017-11-29 MED ORDER — AMOXICILLIN 500 MG PO TABS: ORAL_TABLET | ORAL | 0 refills | Status: DC

## 2017-11-29 NOTE — Telephone Encounter (Signed)
Jonelle Sidle from [facility] called asking for a clearance for the patient so she can get an extraction tomorrow morning, just wanted to make sure she didn't need any antibiotics.  Please fax to [phone]

## 2017-11-29 NOTE — Telephone Encounter (Signed)
LMOM for patient letting her know since she is having an extraction I did call in an antibiotic

## 2017-11-30 ENCOUNTER — Telehealth (INDEPENDENT_AMBULATORY_CARE_PROVIDER_SITE_OTHER): Payer: Self-pay | Admitting: Orthopaedic Surgery

## 2017-11-30 NOTE — Telephone Encounter (Signed)
Jonelle Sidle with [facility] called asked if the patient need an antibiotic when she comes in for her dental work today. Jonelle Sidle said if the patient does need an antibiotic she will need to know how to administer it. Patient has a 2:00pm appointment. Jonelle Sidle asked if the the information can be faxed to her by 12:00pm today. The fax# is [phone] The number to contact Jonelle Sidle is [phone]

## 2017-11-30 NOTE — Telephone Encounter (Signed)
LMOM for them letting them know I called in antibiotic for her yesterday

## 2018-01-05 ENCOUNTER — Other Ambulatory Visit: Payer: Self-pay | Admitting: Internal Medicine

## 2018-01-05 DIAGNOSIS — I10 Essential (primary) hypertension: Secondary | ICD-10-CM

## 2018-01-05 NOTE — Telephone Encounter (Signed)
Refilled for 30 days. Patient needs appointment and updated BMET given thiazide use. Last BMET 11/2016.

## 2018-02-07 ENCOUNTER — Other Ambulatory Visit: Payer: Self-pay | Admitting: Internal Medicine

## 2018-02-07 DIAGNOSIS — I10 Essential (primary) hypertension: Secondary | ICD-10-CM

## 2018-02-11 ENCOUNTER — Ambulatory Visit: Payer: 59 | Admitting: Internal Medicine

## 2018-02-13 ENCOUNTER — Ambulatory Visit (HOSPITAL_COMMUNITY)
Admission: EM | Admit: 2018-02-13 | Discharge: 2018-02-13 | Disposition: A | Discharge status: Home / Self Care | Payer: 59 | Attending: Family Medicine | Admitting: Family Medicine

## 2018-02-13 ENCOUNTER — Encounter (HOSPITAL_COMMUNITY): Payer: Self-pay | Admitting: Emergency Medicine

## 2018-02-13 DIAGNOSIS — R197 Diarrhea, unspecified: Secondary | ICD-10-CM

## 2018-02-13 DIAGNOSIS — B379 Candidiasis, unspecified: Secondary | ICD-10-CM

## 2018-02-13 DIAGNOSIS — L270 Generalized skin eruption due to drugs and medicaments taken internally: Secondary | ICD-10-CM

## 2018-02-13 MED ORDER — METHYLPREDNISOLONE SODIUM SUCC 125 MG IJ SOLR
125.0000 mg | Freq: Once | INTRAMUSCULAR | Status: AC
  Administered 2018-02-13: 125 mg via INTRAMUSCULAR

## 2018-02-13 MED ORDER — METHYLPREDNISOLONE SODIUM SUCC 125 MG IJ SOLR
INTRAMUSCULAR | Status: AC
  Filled 2018-02-13: qty 2

## 2018-02-13 MED ORDER — PREDNISONE 20 MG PO TABS: 20.0000 mg | ORAL_TABLET | Freq: Two times a day (BID) | ORAL | 0 refills | Status: DC

## 2018-02-13 MED ORDER — FLUCONAZOLE 200 MG PO TABS: ORAL_TABLET | ORAL | 0 refills | Status: DC

## 2018-02-13 NOTE — Discharge Instructions (Addendum)
Discontinue clindamycin Steroid shot given in office Prescribed prednisone take as directed and to completion Continue with OTC antihistamines as needed for symptomatic relief Stool sample kit given.  Please drop off sample today or tomorrow.  We will follow up with you regarding your results Return or go to the ED if you have any new or worsening symptoms such as fever, chills, nausea, vomiting, persistent diarrhea, abdominal pain, worsening rash, difficulty breathing, difficulty swallowing, etc..Marland Kitchen

## 2018-02-13 NOTE — ED Triage Notes (Signed)
Pt states she had some teeth pulled a few weeks ago and the dentist gave her amoxicillin, but she was still having tooth pain so she went back to the dentist and they gave her clindamycin instead. Pt took two pills on Friday, pt states later on she felt scratching on her face, and pt also states "I think it also gave me a yeast infection". Pt took a benadryl yesterday without relief. Pt also c/o vaginal irritation. Pt states the rash is all over her torso. Pt also noted to have a fever.

## 2018-02-13 NOTE — ED Provider Notes (Signed)
[place]   [id number] 02/13/18 Arrival Time: 54  CC: Rash and diarrhea  SUBJECTIVE:  Martha Manning is a 62 y.o. female who presents with a rash that began on Friday.  Started after taking two doses of clindamycin. Medications was prescribed by dentist after having teeth pulled.  The rash is diffuse about the body.  Describes it as itchy.  Has tried benadryl without relief.  Symptoms are made worse with heat.  Reports similar experiencing hives in the past.    Patient also mentions having 8 episodes of watery diarrhea that started on Friday. Denies abdominal pain or cramping.  Describes it as profuse and watery.  Tried imodium with relief.  Denies similar symptoms in the past.  Complains of associated fever.    Denies chills, nausea, appetite change, weight change, changes in bladder habits, vomiting, difficulty breathing, mouth/ lip/ throat tingling or swelling, SOB, chest pain, changes in bowel or bladder function.    Patient also requesting medication for yeast infection.    ROS: As per HPI.  Past Medical History:  Diagnosis Date  . Allergy   . Arthritis    "right hip" (10/08/2015)  . GERD (gastroesophageal reflux disease)    depends on diet   . Hypertension    Past Surgical History:  Procedure Laterality Date  . DENTAL SURGERY    . TOTAL HIP ARTHROPLASTY Right 10/08/2015   Procedure: RIGHT TOTAL HIP ARTHROPLASTY ANTERIOR APPROACH;  Surgeon: Mcarthur Rossetti, MD;  Location: [facility];  Service: Orthopedics;  Laterality: Right;  Marland Kitchen VAGINAL DELIVERY     41 yrs ago   Allergies  Allergen Reactions  . Clindamycin/Lincomycin     Pt states she took this antibiotic once and broke out in a rash.    No current facility-administered medications on file prior to encounter.    Current Outpatient Medications on File Prior to Encounter  Medication Sig Dispense Refill  . aspirin 81 MG chewable tablet Chew 81 mg by mouth daily.    . Aspirin-Acetaminophen-Caffeine  (GOODY HEADACHE PO) Take by mouth as needed.    . hydrOXYzine (ATARAX/VISTARIL) 25 MG tablet 1-2 tabs PO PRN at bedtime 30 tablet 2  . ibuprofen (ADVIL,MOTRIN) 200 MG tablet Take 200 mg by mouth every 6 (six) hours as needed.    Marland Kitchen lisinopril-hydrochlorothiazide (PRINZIDE,ZESTORETIC) 20-25 MG tablet TAKE 1 TABLET BY MOUTH ONCE DAILY 30 tablet 0  . loratadine (ALLERGY RELIEF) 10 MG tablet Take 10 mg by mouth daily as needed for allergies or itching.    . meloxicam (MOBIC) 15 MG tablet TAKE ONE TABLET BY MOUTH ONCE DAILY (Patient taking differently: TAKE ONE TABLET BY MOUTH twice a day) 30 tablet 0  . Multiple Vitamin (MULTIVITAMIN WITH MINERALS) TABS tablet Take 1 tablet by mouth daily as needed (for supplementation). Reported on 12/02/2015     Social History   Socioeconomic History  . Marital status: Divorced    Spouse name: Not on file  . Number of children: Not on file  . Years of education: Not on file  . Highest education level: Not on file  Occupational History  . Not on file  Social Needs  . Financial resource strain: Not on file  . Food insecurity:    Worry: Not on file    Inability: Not on file  . Transportation needs:    Medical: Not on file    Non-medical: Not on file  Tobacco Use  . Smoking status: Current Every Day Smoker    Packs/day:  0.50    Years: 40.00    Pack years: 20.00    Types: Cigarettes  . Smokeless tobacco: Never Used  . Tobacco comment: 7 cigs a day   Substance and Sexual Activity  . Alcohol use: Yes    Alcohol/week: 7.0 standard drinks    Types: 7 Glasses of wine per week    Comment: 7 glasses of wine per week, one per day  . Drug use: No  . Sexual activity: Not Currently  Lifestyle  . Physical activity:    Days per week: Not on file    Minutes per session: Not on file  . Stress: Not on file  Relationships  . Social connections:    Talks on phone: Not on file    Gets together: Not on file    Attends religious service: Not on file    Active  member of club or organization: Not on file    Attends meetings of clubs or organizations: Not on file    Relationship status: Not on file  . Intimate partner violence:    Fear of current or ex partner: Not on file    Emotionally abused: Not on file    Physically abused: Not on file    Forced sexual activity: Not on file  Other Topics Concern  . Not on file  Social History Narrative  . Not on file   Family History  Problem Relation Age of Onset  . Hypertension Mother   . Stroke Mother   . Hypertension Sister   . Colon cancer Neg Hx   . Esophageal cancer Neg Hx   . Rectal cancer Neg Hx   . Stomach cancer Neg Hx   . Pancreatic cancer Neg Hx   . Prostate cancer Neg Hx     OBJECTIVE: Vitals:   02/13/18 1021  BP: 115/79  Pulse: (!) 104  Resp: 16  Temp: (!) 101.1 F (38.4 C)  TempSrc: Oral  SpO2: 96%    General appearance: alert; appears uncomfortable; nontoxic  Lungs: clear to auscultation bilaterally Heart: regular rate and rhythm.  Radial pulse 2+ bilaterally Abdomen: soft; nondistended; normal active BS; nontender to palpation; no guarding Extremities: no edema Skin: warm and dry; erythematous maculopapular rash diffuse about the face,  neck, and anterior trunk; blanches with pressure; nontender to palpation Psychological: alert and cooperative; normal mood and affect  ASSESSMENT & PLAN:  1. Allergic drug rash   2. Diarrhea, unspecified type    Discussed patient case with Dr. Meda Coffee.    Meds ordered this encounter  Medications  . methylPREDNISolone sodium succinate (SOLU-MEDROL) 125 mg/2 mL injection 125 mg  . DISCONTD: predniSONE (DELTASONE) 20 MG tablet    Sig: Take 1 tablet (20 mg total) by mouth 2 (two) times daily with a meal for 5 days.    Dispense:  10 tablet    Refill:  0    Order Specific Question:   Supervising Provider    Answer:   Wynona Luna [phone]  . fluconazole (DIFLUCAN) 200 MG tablet    Sig: Take one dose by mouth, wait 72 hours,  and then take second dose by mouth    Dispense:  2 tablet    Refill:  0    Order Specific Question:   Supervising Provider    Answer:   Wynona Luna [[id number]]   Discontinue clindamycin Steroid shot given in office Patient declines prednisone.   Continue with OTC antihistamines as needed for symptomatic relief Stool sample kit  given.  Please drop off sample today or tomorrow.  We will follow up with you regarding your results Return or go to the ED if you have any new or worsening symptoms such as fever, chills, nausea, vomiting, persistent diarrhea, abdominal pain, worsening rash, difficulty breathing, difficulty swallowing, etc...  Diflucan prescribed.  Take as prescribed and to completion  Reviewed expectations re: course of current medical issues. Questions answered. Outlined signs and symptoms indicating need for more acute intervention. Patient verbalized understanding. After Visit Summary given.   Lestine Box, PA-C 02/13/18 1401

## 2018-02-14 ENCOUNTER — Ambulatory Visit: Payer: 59 | Admitting: Internal Medicine

## 2018-02-14 ENCOUNTER — Other Ambulatory Visit: Payer: Self-pay | Admitting: Internal Medicine

## 2018-02-14 DIAGNOSIS — Z1231 Encounter for screening mammogram for malignant neoplasm of breast: Secondary | ICD-10-CM

## 2018-02-22 ENCOUNTER — Encounter: Payer: Self-pay | Admitting: Internal Medicine

## 2018-02-22 ENCOUNTER — Ambulatory Visit: Payer: 59 | Attending: Internal Medicine | Admitting: Internal Medicine

## 2018-02-22 VITALS — BP 107/76 | HR 106 | Temp 98.6°F | Resp 16 | Wt 146.8 lb

## 2018-02-22 DIAGNOSIS — M1611 Unilateral primary osteoarthritis, right hip: Secondary | ICD-10-CM | POA: Diagnosis not present

## 2018-02-22 DIAGNOSIS — R21 Rash and other nonspecific skin eruption: Secondary | ICD-10-CM | POA: Diagnosis not present

## 2018-02-22 DIAGNOSIS — F17210 Nicotine dependence, cigarettes, uncomplicated: Secondary | ICD-10-CM | POA: Diagnosis not present

## 2018-02-22 DIAGNOSIS — T50905A Adverse effect of unspecified drugs, medicaments and biological substances, initial encounter: Secondary | ICD-10-CM | POA: Insufficient documentation

## 2018-02-22 DIAGNOSIS — L299 Pruritus, unspecified: Secondary | ICD-10-CM

## 2018-02-22 DIAGNOSIS — I10 Essential (primary) hypertension: Secondary | ICD-10-CM | POA: Diagnosis not present

## 2018-02-22 MED ORDER — PREDNISONE 20 MG PO TABS: 30.0000 mg | ORAL_TABLET | Freq: Every day | ORAL | 0 refills | Status: DC

## 2018-02-22 NOTE — Progress Notes (Signed)
Pt states she had a second allergic reaction to some medication  Pt states she went to the urgent care because she had an allergic reaction  Pt states she itches from head to toe

## 2018-02-22 NOTE — Progress Notes (Signed)
Patient ID: Martha Manning, female    DOB: 1956-06-24  MRN: [id number]  CC: Hypertension  Subjective: Martha Manning is a 62 y.o. female who presents for chronic ds management.  Her concerns today include:  62 year old female with history of HTN, tobacco dependence, OA of right hip status post THA.  Patient presents today complaining of having reaction to medication.  On the 29th of last month she had 3 teeth pulled and was given amoxicillin.  She was still having some pain post extractions, so she went back to the dentist on 8/15 and was told that her gum's appeared to be healing well.  She was placed on Tylenol with Codeine and clindamycin.  After taking the medications for a day and a half, she woke up in the middle of the night with intense itching and rash of the face neck and chest.  She took some Benadryl.  She also had a little diarrhea.  She stopped the clindamycin.  She was still having itching so was seen in urgent care on 8/18 and was given prednisone for 5 days.  Also given Diflucan for vaginal yeast.  She got better.  However she started having dental pain again so she took Tylenol with codeine for the past 3 days and started having itching and rash all over again.  She came in today to be evaluated.    HTN: taking meds -amlodipine and lisinopril/HCTZ.   Patient Active Problem List   Diagnosis Date Noted  . Diabetes mellitus screening 12/21/2016  . Status post total replacement of right hip 10/08/2015  . Ganglion of left wrist 05/16/2015  . Essential hypertension 03/14/2014  . Osteoarthritis of right hip 03/14/2014  . Smoking 03/14/2014     Current Outpatient Medications on File Prior to Visit  Medication Sig Dispense Refill  . aspirin 81 MG chewable tablet Chew 81 mg by mouth daily.    . Aspirin-Acetaminophen-Caffeine (GOODY HEADACHE PO) Take by mouth as needed.    . fluconazole (DIFLUCAN) 200 MG tablet Take one dose by mouth, wait 72 hours, and then take second  dose by mouth (Patient not taking: Reported on 02/22/2018) 2 tablet 0  . hydrOXYzine (ATARAX/VISTARIL) 25 MG tablet 1-2 tabs PO PRN at bedtime 30 tablet 2  . ibuprofen (ADVIL,MOTRIN) 200 MG tablet Take 200 mg by mouth every 6 (six) hours as needed.    Marland Kitchen lisinopril-hydrochlorothiazide (PRINZIDE,ZESTORETIC) 20-25 MG tablet TAKE 1 TABLET BY MOUTH ONCE DAILY 30 tablet 0  . loratadine (ALLERGY RELIEF) 10 MG tablet Take 10 mg by mouth daily as needed for allergies or itching.    . meloxicam (MOBIC) 15 MG tablet TAKE ONE TABLET BY MOUTH ONCE DAILY (Patient taking differently: TAKE ONE TABLET BY MOUTH twice a day) 30 tablet 0  . Multiple Vitamin (MULTIVITAMIN WITH MINERALS) TABS tablet Take 1 tablet by mouth daily as needed (for supplementation). Reported on 12/02/2015     No current facility-administered medications on file prior to visit.     Allergies  Allergen Reactions  . Clindamycin/Lincomycin     Pt states she took this antibiotic once and broke out in a rash.   . Codeine     Social History   Socioeconomic History  . Marital status: Divorced    Spouse name: Not on file  . Number of children: Not on file  . Years of education: Not on file  . Highest education level: Not on file  Occupational History  . Not on file  Social Needs  .  Financial resource strain: Not on file  . Food insecurity:    Worry: Not on file    Inability: Not on file  . Transportation needs:    Medical: Not on file    Non-medical: Not on file  Tobacco Use  . Smoking status: Current Every Day Smoker    Packs/day: 0.50    Years: 40.00    Pack years: 20.00    Types: Cigarettes  . Smokeless tobacco: Never Used  . Tobacco comment: 7 cigs a day   Substance and Sexual Activity  . Alcohol use: Yes    Alcohol/week: 7.0 standard drinks    Types: 7 Glasses of wine per week    Comment: 7 glasses of wine per week, one per day  . Drug use: No  . Sexual activity: Not Currently  Lifestyle  . Physical activity:     Days per week: Not on file    Minutes per session: Not on file  . Stress: Not on file  Relationships  . Social connections:    Talks on phone: Not on file    Gets together: Not on file    Attends religious service: Not on file    Active member of club or organization: Not on file    Attends meetings of clubs or organizations: Not on file    Relationship status: Not on file  . Intimate partner violence:    Fear of current or ex partner: Not on file    Emotionally abused: Not on file    Physically abused: Not on file    Forced sexual activity: Not on file  Other Topics Concern  . Not on file  Social History Narrative  . Not on file    Family History  Problem Relation Age of Onset  . Hypertension Mother   . Stroke Mother   . Hypertension Sister   . Colon cancer Neg Hx   . Esophageal cancer Neg Hx   . Rectal cancer Neg Hx   . Stomach cancer Neg Hx   . Pancreatic cancer Neg Hx   . Prostate cancer Neg Hx     Past Surgical History:  Procedure Laterality Date  . DENTAL SURGERY    . TOTAL HIP ARTHROPLASTY Right 10/08/2015   Procedure: RIGHT TOTAL HIP ARTHROPLASTY ANTERIOR APPROACH;  Surgeon: Mcarthur Rossetti, MD;  Location: [facility];  Service: Orthopedics;  Laterality: Right;  Marland Kitchen VAGINAL DELIVERY     41 yrs ago    ROS: Review of Systems  PHYSICAL EXAM: BP 107/76   Pulse (!) 106   Temp 98.6 F (37 C) (Oral)   Resp 16   Wt 146 lb 12.8 oz (66.6 kg)   SpO2 98%   BMI 29.65 kg/m   Wt Readings from Last 3 Encounters:  02/22/18 146 lb 12.8 oz (66.6 kg)  10/26/17 146 lb (66.2 kg)  03/22/17 142 lb (64.4 kg)    Physical Exam  General appearance - alert, well appearing, and in no distress Mental status - normal mood, behavior, speech, dress, motor activity, and thought processes Neck - supple, no significant adenopathy Chest - clear to auscultation, no wheezes, rales or rhonchi, symmetric air entry Heart - normal rate, regular rhythm, normal S1, S2, no murmurs, rubs,  clicks or gallops Extremities - peripheral pulses normal, no pedal edema, no clubbing or cyanosis Skin -some warmth and redness of the left calf and popliteal area.  Some excoriations over the upper torso.  ASSESSMENT AND PLAN: 1. Adverse effect of  drug, initial encounter Advised to stop Tylenol with Codeine.  Prednisone and Zantac x6 days; prescriptions given.  Continue Benadryl as needed.  2. Essential hypertension At goal.  Continue amlodipine 5 mg and lisinopril/HCTZ  Patient was given the opportunity to ask questions.  Patient verbalized understanding of the plan and was able to repeat key elements of the plan.   No orders of the defined types were placed in this encounter.    Requested Prescriptions   Signed Prescriptions Disp Refills  . predniSONE (DELTASONE) 20 MG tablet 9 tablet 0    Sig: Take 1.5 tablets (30 mg total) by mouth daily with breakfast.    Return in about 3 months (around 05/25/2018).  Karle Plumber, MD, FACP

## 2018-02-22 NOTE — Patient Instructions (Signed)
Take Prednisone daily as prescribed for 6 days along with Zantac 150 mg daily for 6 days.  Use the Benadryl as needed.

## 2018-03-01 ENCOUNTER — Other Ambulatory Visit: Payer: Self-pay | Admitting: Internal Medicine

## 2018-03-01 DIAGNOSIS — I10 Essential (primary) hypertension: Secondary | ICD-10-CM

## 2018-03-11 ENCOUNTER — Other Ambulatory Visit: Payer: Self-pay | Admitting: Internal Medicine

## 2018-03-14 ENCOUNTER — Ambulatory Visit
Admission: RE | Admit: 2018-03-14 | Discharge: 2018-03-14 | Disposition: A | Discharge status: Home / Self Care | Payer: 59 | Source: Ambulatory Visit | Attending: Internal Medicine | Admitting: Internal Medicine

## 2018-03-14 DIAGNOSIS — Z1231 Encounter for screening mammogram for malignant neoplasm of breast: Secondary | ICD-10-CM | POA: Diagnosis not present

## 2018-03-21 DIAGNOSIS — H5213 Myopia, bilateral: Secondary | ICD-10-CM | POA: Diagnosis not present

## 2018-03-21 DIAGNOSIS — H52221 Regular astigmatism, right eye: Secondary | ICD-10-CM | POA: Diagnosis not present

## 2018-03-21 DIAGNOSIS — H524 Presbyopia: Secondary | ICD-10-CM | POA: Diagnosis not present

## 2018-05-23 ENCOUNTER — Telehealth (INDEPENDENT_AMBULATORY_CARE_PROVIDER_SITE_OTHER): Payer: Self-pay

## 2018-05-23 NOTE — Telephone Encounter (Signed)
Pam would like a note faxed to there office stating that patient does not need Pre Dental before her cleaning.  Patient had Right Hip Replacement 10/08/2015.  Cb# is [phone].  Fax# is [phone].  Please advise.   Thank You.

## 2018-05-23 NOTE — Telephone Encounter (Signed)
Faxed note to provided number

## 2018-06-16 ENCOUNTER — Other Ambulatory Visit: Payer: Self-pay | Admitting: Internal Medicine

## 2018-06-16 DIAGNOSIS — I10 Essential (primary) hypertension: Secondary | ICD-10-CM

## 2018-07-13 ENCOUNTER — Other Ambulatory Visit: Payer: Self-pay | Admitting: Internal Medicine

## 2018-07-13 DIAGNOSIS — I10 Essential (primary) hypertension: Secondary | ICD-10-CM

## 2018-07-15 ENCOUNTER — Telehealth: Payer: Self-pay | Admitting: Internal Medicine

## 2018-07-15 NOTE — Telephone Encounter (Signed)
1) Medication(s) Requested (by name): lisinopril-hydrochlorothiazide (PRINZIDE,ZESTORETIC) 20-25 MG tablet   2) Pharmacy of Choice: [facility] on [address]   3) Special Requests: Patient has scheduled an appt. For 07/26/2018    Approved medications will be sent to the pharmacy, we will reach out if there is an issue.  Requests made after 3pm may not be addressed until the following business day!  If a patient is unsure of the name of the medication(s) please note and ask patient to call back when they are able to provide all info, do not send to responsible party until all information is available!

## 2018-07-16 ENCOUNTER — Other Ambulatory Visit: Payer: Self-pay | Admitting: Internal Medicine

## 2018-07-16 DIAGNOSIS — I10 Essential (primary) hypertension: Secondary | ICD-10-CM

## 2018-07-18 NOTE — Telephone Encounter (Signed)
Request already sent to provider via the pharmacy's electronic request.

## 2018-07-26 ENCOUNTER — Encounter: Payer: Self-pay | Admitting: Internal Medicine

## 2018-07-26 ENCOUNTER — Ambulatory Visit: Payer: 59 | Attending: Internal Medicine | Admitting: Internal Medicine

## 2018-07-26 VITALS — BP 123/85 | HR 84 | Temp 98.5°F | Resp 16

## 2018-07-26 DIAGNOSIS — M1611 Unilateral primary osteoarthritis, right hip: Secondary | ICD-10-CM | POA: Insufficient documentation

## 2018-07-26 DIAGNOSIS — Z2821 Immunization not carried out because of patient refusal: Secondary | ICD-10-CM

## 2018-07-26 DIAGNOSIS — Z7982 Long term (current) use of aspirin: Secondary | ICD-10-CM | POA: Insufficient documentation

## 2018-07-26 DIAGNOSIS — Z96641 Presence of right artificial hip joint: Secondary | ICD-10-CM | POA: Insufficient documentation

## 2018-07-26 DIAGNOSIS — Z79899 Other long term (current) drug therapy: Secondary | ICD-10-CM | POA: Insufficient documentation

## 2018-07-26 DIAGNOSIS — F17200 Nicotine dependence, unspecified, uncomplicated: Secondary | ICD-10-CM

## 2018-07-26 DIAGNOSIS — Z791 Long term (current) use of non-steroidal anti-inflammatories (NSAID): Secondary | ICD-10-CM | POA: Insufficient documentation

## 2018-07-26 DIAGNOSIS — F5101 Primary insomnia: Secondary | ICD-10-CM | POA: Diagnosis not present

## 2018-07-26 DIAGNOSIS — Z8249 Family history of ischemic heart disease and other diseases of the circulatory system: Secondary | ICD-10-CM | POA: Insufficient documentation

## 2018-07-26 DIAGNOSIS — F17210 Nicotine dependence, cigarettes, uncomplicated: Secondary | ICD-10-CM | POA: Insufficient documentation

## 2018-07-26 DIAGNOSIS — Z885 Allergy status to narcotic agent status: Secondary | ICD-10-CM | POA: Insufficient documentation

## 2018-07-26 DIAGNOSIS — I10 Essential (primary) hypertension: Secondary | ICD-10-CM

## 2018-07-26 DIAGNOSIS — Z881 Allergy status to other antibiotic agents status: Secondary | ICD-10-CM | POA: Insufficient documentation

## 2018-07-26 DIAGNOSIS — K219 Gastro-esophageal reflux disease without esophagitis: Secondary | ICD-10-CM | POA: Diagnosis not present

## 2018-07-26 MED ORDER — HYDROXYZINE HCL 25 MG PO TABS: ORAL_TABLET | ORAL | 3 refills | Status: DC

## 2018-07-26 MED ORDER — LISINOPRIL-HYDROCHLOROTHIAZIDE 20-25 MG PO TABS: 1.0000 | ORAL_TABLET | Freq: Every day | ORAL | 6 refills | Status: DC

## 2018-07-26 MED ORDER — OMEPRAZOLE 20 MG PO CPDR
20.0000 mg | DELAYED_RELEASE_CAPSULE | Freq: Every day | ORAL | 3 refills | Status: DC
Start: 2018-07-26 — End: 2019-03-16

## 2018-07-26 MED ORDER — AMLODIPINE BESYLATE 10 MG PO TABS: 10.0000 mg | ORAL_TABLET | Freq: Every day | ORAL | 6 refills | Status: DC

## 2018-07-26 NOTE — Progress Notes (Signed)
Patient ID: MERCEDIES Manning, female    DOB: 1956/01/31  MRN: [id number]  CC: Hypertension   Subjective: Martha Manning is a 63 y.o. female who presents for chronic ds management Her concerns today include:  63 year old female with history of HTN, tobacco dependence, OA of right hip status post THA.  HYPERTENSION Currently taking: see medication list Med Adherence: [x]  Yes, Lisinopril/HCTZ and Norvasc 5 mg.  Amlodipine has fallen off of her med list but patient is still taking this.     Medication side effects: []  Yes    [x]  No Adherence with salt restriction: [x]  Yes    []  No Home Monitoring?: []  Yes    [x]  No SOB? []  Yes    [x]  No Chest Pain?: []  Yes    [x]  No Leg swelling?: []  Yes    [x]  No Headaches?: []  Yes    [x]  No Dizziness? 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     Insomnia: wants refill on Hydroxyzine.  Out x 2 mths.  Reports that some nights she still has difficulty staying asleep.  She turns off all lights and sounds.  She does not drink caffeinated beverages or alcohol beverages at bedtime.  Tob dep: No smoking 7 cig/day.  Not ready to give a trial of quitting  Request something to help with acid reflux symptoms.  Recently she has been having acid reflux with burning in the chest and acid taste at the back of the mouth after meals.  Occur when she eats spicy foods and without the foods.  Ibuprofen is on med list.  She reports that she sometimes uses it   Patient Active Problem List   Diagnosis Date Noted  . Diabetes mellitus screening 12/21/2016  . Status post total replacement of right hip 10/08/2015  . Ganglion of left wrist 05/16/2015  . Essential hypertension 03/14/2014  . Osteoarthritis of right hip 03/14/2014  . Smoking 03/14/2014     Current Outpatient Medications on File Prior to Visit  Medication Sig Dispense Refill  . aspirin 81 MG chewable tablet Chew 81 mg by mouth daily.    . Aspirin-Acetaminophen-Caffeine (GOODY HEADACHE  PO) Take by mouth as needed.    . loratadine (ALLERGY RELIEF) 10 MG tablet Take 10 mg by mouth daily as needed for allergies or itching.    . meloxicam (MOBIC) 15 MG tablet TAKE ONE TABLET BY MOUTH ONCE DAILY (Patient taking differently: TAKE ONE TABLET BY MOUTH twice a day) 30 tablet 0  . Multiple Vitamin (MULTIVITAMIN WITH MINERALS) TABS tablet Take 1 tablet by mouth daily as needed (for supplementation). Reported on 12/02/2015     No current facility-administered medications on file prior to visit.     Allergies  Allergen Reactions  . Clindamycin/Lincomycin     Pt states she took this antibiotic once and broke out in a rash.   . Codeine     Social History   Socioeconomic History  . Marital status: Divorced    Spouse name: Not on file  . Number of children: Not on file  . Years of education: Not on file  . Highest education level: Not on file  Occupational History  . Not on file  Social Needs  . Financial resource strain: Not on file  . Food insecurity:    Worry: Not on file    Inability: Not on file  . Transportation needs:    Medical: Not on file    Non-medical:  Not on file  Tobacco Use  . Smoking status: Current Every Day Smoker    Packs/day: 0.50    Years: 40.00    Pack years: 20.00    Types: Cigarettes  . Smokeless tobacco: Never Used  . Tobacco comment: 7 cigs a day   Substance and Sexual Activity  . Alcohol use: Yes    Alcohol/week: 7.0 standard drinks    Types: 7 Glasses of wine per week    Comment: 7 glasses of wine per week, one per day  . Drug use: No  . Sexual activity: Not Currently  Lifestyle  . Physical activity:    Days per week: Not on file    Minutes per session: Not on file  . Stress: Not on file  Relationships  . Social connections:    Talks on phone: Not on file    Gets together: Not on file    Attends religious service: Not on file    Active member of club or organization: Not on file    Attends meetings of clubs or organizations: Not  on file    Relationship status: Not on file  . Intimate partner violence:    Fear of current or ex partner: Not on file    Emotionally abused: Not on file    Physically abused: Not on file    Forced sexual activity: Not on file  Other Topics Concern  . Not on file  Social History Narrative  . Not on file    Family History  Problem Relation Age of Onset  . Hypertension Mother   . Stroke Mother   . Hypertension Sister   . Colon cancer Neg Hx   . Esophageal cancer Neg Hx   . Rectal cancer Neg Hx   . Stomach cancer Neg Hx   . Pancreatic cancer Neg Hx   . Prostate cancer Neg Hx     Past Surgical History:  Procedure Laterality Date  . DENTAL SURGERY    . TOTAL HIP ARTHROPLASTY Right 10/08/2015   Procedure: RIGHT TOTAL HIP ARTHROPLASTY ANTERIOR APPROACH;  Surgeon: Mcarthur Rossetti, MD;  Location: [facility];  Service: Orthopedics;  Laterality: Right;  Marland Kitchen VAGINAL DELIVERY     41 yrs ago    ROS: Review of Systems Negative except as above PHYSICAL EXAM: BP 123/85   Pulse 84   Temp 98.5 F (36.9 C) (Oral)   Resp 16   SpO2 99%   Wt Readings from Last 3 Encounters:  02/22/18 146 lb 12.8 oz (66.6 kg)  10/26/17 146 lb (66.2 kg)  03/22/17 142 lb (64.4 kg)  BP 130/88  Physical Exam  General appearance - alert, well appearing, and in no distress Mental status - normal mood, behavior, speech, dress, motor activity, and thought processes Mouth - mucous membranes moist, pharynx normal without lesions Neck - supple, no significant adenopathy Chest - clear to auscultation, no wheezes, rales or rhonchi, symmetric air entry Heart - normal rate, regular rhythm, normal S1, S2, no murmurs, rubs, clicks or gallops Extremities - peripheral pulses normal, no pedal edema, no clubbing or cyanosis  ASSESSMENT AND PLAN: 1. Essential hypertension Not at goal.  Increase amlodipine to 10 mg daily. - lisinopril-hydrochlorothiazide (PRINZIDE,ZESTORETIC) 20-25 MG tablet; Take 1 tablet by mouth  daily.  Dispense: 30 tablet; Refill: 6 - amLODipine (NORVASC) 10 MG tablet; Take 1 tablet (10 mg total) by mouth daily.  Dispense: 30 tablet; Refill: 6  2. Tobacco dependence Patient advised to quit smoking. Discussed health risks  associated with smoking including lung and other types of cancers, chronic lung diseases and CV risks.. Pt not ready to give trail of quitting.  Less than 5 minutes spent on counseling.  3. Primary insomnia Good sleep hygiene discussed and encouraged.  Refill given on hydroxyzine  4. Gastroesophageal reflux disease without esophagitis GERD precautions discussed.  Advised to stop ibuprofen.  Avoid foods that can cause flareup of reflux including highly seasoned foods, juices and tomato based foods.  Informed her that smoking sensation would also help.  Advised to eat her last meal at least 2 to 3 hours before laying down and to sleep with her head elevated - omeprazole (PRILOSEC) 20 MG capsule; Take 1 capsule (20 mg total) by mouth daily.  Dispense: 30 capsule; Refill: 3  5. Influenza vaccination declined  Patient overdue for baseline labs but she declined stating that she gets screening labs done in April of each year through health fair at her job.  I advised her that they may not check kidney function which should be checked at least once a year in people with hypertension.  Patient still declined stating that she will bring in her lab results when she attends the health fair in the spring of this year.  Patient was given the opportunity to ask questions.  Patient verbalized understanding of the plan and was able to repeat key elements of the plan.   No orders of the defined types were placed in this encounter.    Requested Prescriptions   Signed Prescriptions Disp Refills  . lisinopril-hydrochlorothiazide (PRINZIDE,ZESTORETIC) 20-25 MG tablet 30 tablet 6    Sig: Take 1 tablet by mouth daily.  Marland Kitchen amLODipine (NORVASC) 10 MG tablet 30 tablet 6    Sig: Take 1  tablet (10 mg total) by mouth daily.  . hydrOXYzine (ATARAX/VISTARIL) 25 MG tablet 60 tablet 3    Sig: TAKE 1 TO 2 TABLET PO QHS PRN  . omeprazole (PRILOSEC) 20 MG capsule 30 capsule 3    Sig: Take 1 capsule (20 mg total) by mouth daily.    Return in about 4 months (around 11/24/2018).  Karle Plumber, MD, FACP

## 2018-07-26 NOTE — Patient Instructions (Signed)
Stop ibuprofen. Increase amlodipine to 10 mg daily to get your blood pressure better.  Goal is 130/80 or lower.  Please bring in a copy of your labs when you have them done through your job.   Gastroesophageal Reflux Disease, Adult Gastroesophageal reflux (GER) happens when acid from the stomach flows up into the tube that connects the mouth and the stomach (esophagus). Normally, food travels down the esophagus and stays in the stomach to be digested. With GER, food and stomach acid sometimes move back up into the esophagus. You may have a disease called gastroesophageal reflux disease (GERD) if the reflux:  Happens often.  Causes frequent or very bad symptoms.  Causes problems such as damage to the esophagus. When this happens, the esophagus becomes sore and swollen (inflamed). Over time, GERD can make small holes (ulcers) in the lining of the esophagus. What are the causes? This condition is caused by a problem with the muscle between the esophagus and the stomach. When this muscle is weak or not normal, it does not close properly to keep food and acid from coming back up from the stomach. The muscle can be weak because of:  Tobacco use.  Pregnancy.  Having a certain type of hernia (hiatal hernia).  Alcohol use.  Certain foods and drinks, such as coffee, chocolate, onions, and peppermint. What increases the risk? You are more likely to develop this condition if you:  Are overweight.  Have a disease that affects your connective tissue.  Use NSAID medicines. What are the signs or symptoms? Symptoms of this condition include:  Heartburn.  Difficult or painful swallowing.  The feeling of having a lump in the throat.  A bitter taste in the mouth.  Bad breath.  Having a lot of saliva.  Having an upset or bloated stomach.  Belching.  Chest pain. Different conditions can cause chest pain. Make sure you see your doctor if you have chest pain.  Shortness of breath or  noisy breathing (wheezing).  Ongoing (chronic) cough or a cough at night.  Wearing away of the surface of teeth (tooth enamel).  Weight loss. How is this treated? Treatment will depend on how bad your symptoms are. Your doctor may suggest:  Changes to your diet.  Medicine.  Surgery. Follow these instructions at home: Eating and drinking   Follow a diet as told by your doctor. You may need to avoid foods and drinks such as: ? Coffee and tea (with or without caffeine). ? Drinks that contain alcohol. ? Energy drinks and sports drinks. ? Bubbly (carbonated) drinks or sodas. ? Chocolate and cocoa. ? Peppermint and mint flavorings. ? Garlic and onions. ? Horseradish. ? Spicy and acidic foods. These include peppers, chili powder, curry powder, vinegar, hot sauces, and BBQ sauce. ? Citrus fruit juices and citrus fruits, such as oranges, lemons, and limes. ? Tomato-based foods. These include red sauce, chili, salsa, and pizza with red sauce. ? Fried and fatty foods. These include donuts, french fries, potato chips, and high-fat dressings. ? High-fat meats. These include hot dogs, rib eye steak, sausage, ham, and bacon. ? High-fat dairy items, such as whole milk, butter, and cream cheese.  Eat small meals often. Avoid eating large meals.  Avoid drinking large amounts of liquid with your meals.  Avoid eating meals during the 2-3 hours before bedtime.  Avoid lying down right after you eat.  Do not exercise right after you eat. Lifestyle   Do not use any products that contain nicotine or  tobacco. These include cigarettes, e-cigarettes, and chewing tobacco. If you need help quitting, ask your doctor.  Try to lower your stress. If you need help doing this, ask your doctor.  If you are overweight, lose an amount of weight that is healthy for you. Ask your doctor about a safe weight loss goal. General instructions  Pay attention to any changes in your symptoms.  Take  over-the-counter and prescription medicines only as told by your doctor. Do not take aspirin, ibuprofen, or other NSAIDs unless your doctor says it is okay.  Wear loose clothes. Do not wear anything tight around your waist.  Raise (elevate) the head of your bed about 6 inches (15 cm).  Avoid bending over if this makes your symptoms worse.  Keep all follow-up visits as told by your doctor. This is important. Contact a doctor if:  You have new symptoms.  You lose weight and you do not know why.  You have trouble swallowing or it hurts to swallow.  You have wheezing or a cough that keeps happening.  Your symptoms do not get better with treatment.  You have a hoarse voice. Get help right away if:  You have pain in your arms, neck, jaw, teeth, or back.  You feel sweaty, dizzy, or light-headed.  You have chest pain or shortness of breath.  You throw up (vomit) and your throw-up looks like blood or coffee grounds.  You pass out (faint).  Your poop (stool) is bloody or black.  You cannot swallow, drink, or eat. Summary  If a person has gastroesophageal reflux disease (GERD), food and stomach acid move back up into the esophagus and cause symptoms or problems such as damage to the esophagus.  Treatment will depend on how bad your symptoms are.  Follow a diet as told by your doctor.  Take all medicines only as told by your doctor. This information is not intended to replace advice given to you by your health care provider. Make sure you discuss any questions you have with your health care provider. Document Released: 12/02/2007 Document Revised: 12/22/2017 Document Reviewed: 12/22/2017 Elsevier Interactive Patient Education  2019 [employer].

## 2019-03-01 ENCOUNTER — Other Ambulatory Visit: Payer: Self-pay | Admitting: Internal Medicine

## 2019-03-01 DIAGNOSIS — I10 Essential (primary) hypertension: Secondary | ICD-10-CM

## 2019-03-16 ENCOUNTER — Ambulatory Visit: Payer: 59 | Attending: Family Medicine | Admitting: Physician Assistant

## 2019-03-16 ENCOUNTER — Other Ambulatory Visit: Payer: Self-pay

## 2019-03-16 DIAGNOSIS — K219 Gastro-esophageal reflux disease without esophagitis: Secondary | ICD-10-CM | POA: Diagnosis not present

## 2019-03-16 DIAGNOSIS — G4700 Insomnia, unspecified: Secondary | ICD-10-CM | POA: Diagnosis not present

## 2019-03-16 DIAGNOSIS — Z13220 Encounter for screening for lipoid disorders: Secondary | ICD-10-CM

## 2019-03-16 DIAGNOSIS — I10 Essential (primary) hypertension: Secondary | ICD-10-CM

## 2019-03-16 MED ORDER — HYDROXYZINE HCL 25 MG PO TABS: ORAL_TABLET | ORAL | 3 refills | Status: DC

## 2019-03-16 MED ORDER — LISINOPRIL 20 MG PO TABS: 20.0000 mg | ORAL_TABLET | Freq: Every day | ORAL | 3 refills | Status: DC

## 2019-03-16 MED ORDER — LISINOPRIL-HYDROCHLOROTHIAZIDE 20-25 MG PO TABS: 1.0000 | ORAL_TABLET | Freq: Every day | ORAL | 6 refills | Status: DC

## 2019-03-16 MED ORDER — OMEPRAZOLE 20 MG PO CPDR: 20.0000 mg | DELAYED_RELEASE_CAPSULE | Freq: Every day | ORAL | 3 refills | Status: DC

## 2019-03-16 NOTE — Progress Notes (Signed)
Virtual Visit via Telephone Note  I connected with Martha Manning on 03/16/19 at  1:50 PM EDT by telephone and verified that I am speaking with the correct person using two identifiers.   I discussed the limitations, risks, security and privacy concerns of performing an evaluation and management service by telephone and the availability of in person appointments. I also discussed with the patient that there may be a patient responsible charge related to this service. The patient expressed understanding and agreed to proceed.  Patient location:  Work/lunch break My Location:  [place] office Persons on the call:  Me and the patient  History of Present Illness: Patient is concerned with B ankle/foot swelling since shortly amlodipine dose was increased in January.  She wants to stop amlodipine.  She has not checked her BP OOO.  She denies HA/dizziness/DOE/SOB.  She does stand on her feet at work a lot.  No labs in a while.    Needs med RF.      Observations/Objective:  NAD.  A&Ox3   Assessment and Plan: 1. Essential hypertension Stop amlodipine due to swelling-will add 20mg  more of lisinopril to current regimen to make dose lisinopril/HCT 40/25 total daily.  Check BP OOO 2-3 x/week and record and bring to next visit.   - lisinopril-hydrochlorothiazide (ZESTORETIC) 20-25 MG tablet; Take 1 tablet by mouth daily.  Dispense: 30 tablet; Refill: 6 - lisinopril (ZESTRIL) 20 MG tablet; Take 1 tablet (20 mg total) by mouth daily.  Dispense: 90 tablet; Refill: 3 - Comprehensive metabolic panel; Future - CBC with Differential/Platelet; Future  2. Gastroesophageal reflux disease without esophagitis stable - omeprazole (PRILOSEC) 20 MG capsule; Take 1 capsule (20 mg total) by mouth daily.  Dispense: 30 capsule; Refill: 3  3. Insomnia, unspecified type stable - hydrOXYzine (ATARAX/VISTARIL) 25 MG tablet; TAKE 1 TO 2 TABLET PO QHS PRN  Dispense: 60 tablet; Refill: 3  4. Screening, lipid - Lipid  panel; Future  Lab appt scheduled for 9/22  Follow Up Instructions: See PCP in 6-8 weeks   I discussed the assessment and treatment plan with the patient. The patient was provided an opportunity to ask questions and all were answered. The patient agreed with the plan and demonstrated an understanding of the instructions.   The patient was advised to call back or seek an in-person evaluation if the symptoms worsen or if the condition fails to improve as anticipated.  I provided 13 minutes of non-face-to-face time during this encounter.   Freeman Caldron, PA-C  Patient ID: Martha Manning, female   DOB: 04/22/1956, 63 y.o.   MRN: [id number]

## 2019-03-21 ENCOUNTER — Other Ambulatory Visit: Payer: Self-pay

## 2019-03-21 ENCOUNTER — Ambulatory Visit: Payer: 59 | Attending: Internal Medicine

## 2019-03-21 DIAGNOSIS — I10 Essential (primary) hypertension: Secondary | ICD-10-CM

## 2019-03-21 DIAGNOSIS — Z13220 Encounter for screening for lipoid disorders: Secondary | ICD-10-CM

## 2019-03-22 LAB — COMPREHENSIVE METABOLIC PANEL
ALT: 12 IU/L (ref 0–32)
AST: 12 IU/L (ref 0–40)
Albumin/Globulin Ratio: 1.8 (ref 1.2–2.2)
Albumin: 4.3 g/dL (ref 3.8–4.8)
Alkaline Phosphatase: 83 IU/L (ref 39–117)
BUN/Creatinine Ratio: 15 (ref 12–28)
BUN: 13 mg/dL (ref 8–27)
Bilirubin Total: <0.2 mg/dL (ref 0.0–1.2)
CO2: 24 mmol/L (ref 20–29)
Calcium: 9.8 mg/dL (ref 8.7–10.3)
Chloride: 103 mmol/L (ref 96–106)
Creatinine, Ser: 0.86 mg/dL (ref 0.57–1.00)
GFR calc Af Amer: 84 mL/min/{1.73_m2} (ref >59)
GFR calc non Af Amer: 73 mL/min/{1.73_m2} (ref >59)
Globulin, Total: 2.4 g/dL (ref 1.5–4.5)
Glucose: 104 mg/dL — ABNORMAL HIGH (ref 65–99)
Potassium: 3.8 mmol/L (ref 3.5–5.2)
Sodium: 137 mmol/L (ref 134–144)
Total Protein: 6.7 g/dL (ref 6.0–8.5)

## 2019-03-22 LAB — CBC WITH DIFFERENTIAL/PLATELET
Basophils Absolute: 0 10*3/uL (ref 0.0–0.2)
Basos: 1 %
EOS (ABSOLUTE): 0.1 10*3/uL (ref 0.0–0.4)
Eos: 2 %
Hematocrit: 35.3 % (ref 34.0–46.6)
Hemoglobin: 11 g/dL — ABNORMAL LOW (ref 11.1–15.9)
Immature Grans (Abs): 0 10*3/uL (ref 0.0–0.1)
Immature Granulocytes: 0 %
Lymphocytes Absolute: 3 10*3/uL (ref 0.7–3.1)
Lymphs: 35 %
MCH: 26.4 pg — ABNORMAL LOW (ref 26.6–33.0)
MCHC: 31.2 g/dL — ABNORMAL LOW (ref 31.5–35.7)
MCV: 85 fL (ref 79–97)
Monocytes Absolute: 0.7 10*3/uL (ref 0.1–0.9)
Monocytes: 8 %
Neutrophils Absolute: 4.7 10*3/uL (ref 1.4–7.0)
Neutrophils: 54 %
Platelets: 413 10*3/uL (ref 150–450)
RBC: 4.17 x10E6/uL (ref 3.77–5.28)
RDW: 15.9 % — ABNORMAL HIGH (ref 11.7–15.4)
WBC: 8.6 10*3/uL (ref 3.4–10.8)

## 2019-03-22 LAB — LIPID PANEL
Chol/HDL Ratio: 5.1 ratio — ABNORMAL HIGH (ref 0.0–4.4)
Cholesterol, Total: 208 mg/dL — ABNORMAL HIGH (ref 100–199)
HDL: 41 mg/dL (ref >39)
LDL Chol Calc (NIH): 117 mg/dL — ABNORMAL HIGH (ref 0–99)
Triglycerides: 285 mg/dL — ABNORMAL HIGH (ref 0–149)
VLDL Cholesterol Cal: 50 mg/dL — ABNORMAL HIGH (ref 5–40)

## 2019-03-30 ENCOUNTER — Telehealth: Payer: Self-pay | Admitting: Internal Medicine

## 2019-03-30 NOTE — Telephone Encounter (Signed)
Called patient to schedule a 6 week f/u appt. Her mail box was full and I was not able to leave a voice mail.

## 2019-03-30 NOTE — Telephone Encounter (Signed)
Patient called stating that their BP medication  Lisinopril is not working by itself and states she needs an adjustment for a different BP medication    states that the amlodipine makes her feet swell so cannot take that one.  Please follow up.

## 2019-03-30 NOTE — Telephone Encounter (Signed)
She takes Lisinopril- HCTZ 40- 25mg   Amlodipine makes her feet swell. She stopped this medication.   SBP readings  138- 143  DBP readings  90-96 Readings measured at [facility].   Advised caution about the BP machines their due to limited calibration and multiple use.

## 2019-04-02 ENCOUNTER — Encounter: Payer: Self-pay | Admitting: Internal Medicine

## 2019-04-02 MED ORDER — CARVEDILOL 3.125 MG PO TABS: 3.1250 mg | ORAL_TABLET | Freq: Two times a day (BID) | ORAL | 1 refills | Status: DC

## 2019-04-03 NOTE — Telephone Encounter (Signed)
Patient aware of changes: Continue Lisinopril- HCTZ 40 mg and add Carvedilol to medication regimen.   Appointment scheduled for May 04, 2019. Unable to take apt for 04/28/2019.

## 2019-04-06 ENCOUNTER — Other Ambulatory Visit: Payer: Self-pay | Admitting: Internal Medicine

## 2019-04-06 DIAGNOSIS — Z1231 Encounter for screening mammogram for malignant neoplasm of breast: Secondary | ICD-10-CM

## 2019-04-25 ENCOUNTER — Ambulatory Visit
Admission: RE | Admit: 2019-04-25 | Discharge: 2019-04-25 | Disposition: A | Discharge status: Home / Self Care | Payer: 59 | Source: Ambulatory Visit | Attending: Internal Medicine | Admitting: Internal Medicine

## 2019-04-25 ENCOUNTER — Other Ambulatory Visit: Payer: Self-pay

## 2019-04-25 DIAGNOSIS — Z1231 Encounter for screening mammogram for malignant neoplasm of breast: Secondary | ICD-10-CM

## 2019-04-28 ENCOUNTER — Ambulatory Visit: Payer: 59 | Admitting: Internal Medicine

## 2019-05-01 ENCOUNTER — Telehealth: Payer: Self-pay

## 2019-05-01 NOTE — Telephone Encounter (Signed)
Contacted pt to go over MM results pt is aware and doesn't have any questions or concerns

## 2019-05-04 ENCOUNTER — Other Ambulatory Visit: Payer: Self-pay

## 2019-05-04 ENCOUNTER — Encounter: Payer: Self-pay | Admitting: Internal Medicine

## 2019-05-04 ENCOUNTER — Ambulatory Visit: Payer: 59 | Attending: Internal Medicine | Admitting: Internal Medicine

## 2019-05-04 VITALS — BP 130/86 | HR 79 | Temp 98.2°F | Resp 16 | Wt 152.8 lb

## 2019-05-04 DIAGNOSIS — I10 Essential (primary) hypertension: Secondary | ICD-10-CM

## 2019-05-04 DIAGNOSIS — R0981 Nasal congestion: Secondary | ICD-10-CM | POA: Diagnosis not present

## 2019-05-04 DIAGNOSIS — E669 Obesity, unspecified: Secondary | ICD-10-CM

## 2019-05-04 DIAGNOSIS — F17200 Nicotine dependence, unspecified, uncomplicated: Secondary | ICD-10-CM

## 2019-05-04 DIAGNOSIS — E782 Mixed hyperlipidemia: Secondary | ICD-10-CM | POA: Diagnosis not present

## 2019-05-04 DIAGNOSIS — Z2821 Immunization not carried out because of patient refusal: Secondary | ICD-10-CM

## 2019-05-04 DIAGNOSIS — F17210 Nicotine dependence, cigarettes, uncomplicated: Secondary | ICD-10-CM

## 2019-05-04 MED ORDER — CARVEDILOL 6.25 MG PO TABS: 3.1250 mg | ORAL_TABLET | Freq: Two times a day (BID) | ORAL | 2 refills | Status: DC

## 2019-05-04 MED ORDER — FLUTICASONE PROPIONATE 50 MCG/ACT NA SUSP: 1.0000 | Freq: Every day | NASAL | 1 refills | Status: DC | PRN

## 2019-05-04 MED ORDER — CARVEDILOL 6.25 MG PO TABS: 6.2500 mg | ORAL_TABLET | Freq: Two times a day (BID) | ORAL | 2 refills | Status: DC

## 2019-05-04 MED ORDER — LISINOPRIL-HYDROCHLOROTHIAZIDE 20-25 MG PO TABS: 1.0000 | ORAL_TABLET | Freq: Every day | ORAL | 2 refills | Status: DC

## 2019-05-04 NOTE — Progress Notes (Signed)
Patient ID: Martha Manning, female    DOB: April 09, 1956  MRN: [id number]  CC: Hypertension   Subjective: Martha Manning is a 63 y.o. female who presents for chronic ds management Her concerns today include:  history of HTN, tobacco dependence, OA of right hip status post THA.  HYPERTENSION Currently taking: see medication list Med Adherence: [x]  Yes -she wonders if BP meds working.  She is on lisinopril/HCTZ and carvedilol. Medication side effects: []  Yes    [x]  No Adherence with salt restriction: [x]  Yes    []  No Home Monitoring?: [x]  Yes  -just got monitor 2 wks ago Monitoring Frequency: Has check blood pressure only 1 time since getting her monitor Home BP results range: 132/83 2 wks ago SOB? []  Yes    [x]  No Chest Pain?: []  Yes    [x]  No Leg swelling?: []  Yes    [x]  No Headaches?: []  Yes    [x]  No Dizziness? []  Yes    [x]  No Comments: doing aerobics and strengthening exercise class 1 x a wk. she plans to increase to twice a week starting next week.     tob dep: 7 cig/day.  Not ready to quit  HL/Wgh:  Total and LDL cholesterol elev on recent labs.  Patient thinks it is because she was not fasting that day. She recently submitted blood sample for BS and cholesterol through her job screening the end of last mth.  She anticipates receiving the results on "I don't eat a lot."  She drinks water, ginger ale and Pepsi. She tries to balance eating out so that she is not doing it too much.   Complains of having daily feeling of pressure on the maxillary sinuses bilaterally since May of this year.  Started after she had to start wearing a mask at work.  She thinks she may have a sinus infection.  She sometimes gets runny nose but not much congestion.  No discolored mucus from the nose.  No fever.  Not much drainage at the back of the throat  Patient Active Problem List   Diagnosis Date Noted  . Gastroesophageal reflux disease without esophagitis 07/26/2018  . Diabetes mellitus  screening 12/21/2016  . Status post total replacement of right hip 10/08/2015  . Ganglion of left wrist 05/16/2015  . Essential hypertension 03/14/2014  . Osteoarthritis of right hip 03/14/2014  . Smoking 03/14/2014     Current Outpatient Medications on File Prior to Visit  Medication Sig Dispense Refill  . aspirin 81 MG chewable tablet Chew 81 mg by mouth daily.    . Aspirin-Acetaminophen-Caffeine (GOODY HEADACHE PO) Take by mouth as needed.    . hydrOXYzine (ATARAX/VISTARIL) 25 MG tablet TAKE 1 TO 2 TABLET PO QHS PRN 60 tablet 3  . loratadine (ALLERGY RELIEF) 10 MG tablet Take 10 mg by mouth daily as needed for allergies or itching.    . Multiple Vitamin (MULTIVITAMIN WITH MINERALS) TABS tablet Take 1 tablet by mouth daily as needed (for supplementation). Reported on 12/02/2015    . omeprazole (PRILOSEC) 20 MG capsule Take 1 capsule (20 mg total) by mouth daily. 30 capsule 3   No current facility-administered medications on file prior to visit.     Allergies  Allergen Reactions  . Clindamycin/Lincomycin     Pt states she took this antibiotic once and broke out in a rash.   . Codeine   . Norvasc [Amlodipine Besylate] Swelling    Social History   Socioeconomic History  . Marital  status: Divorced    Spouse name: Not on file  . Number of children: Not on file  . Years of education: Not on file  . Highest education level: Not on file  Occupational History  . Not on file  Social Needs  . Financial resource strain: Not on file  . Food insecurity    Worry: Not on file    Inability: Not on file  . Transportation needs    Medical: Not on file    Non-medical: Not on file  Tobacco Use  . Smoking status: Current Every Day Smoker    Packs/day: 0.50    Years: 40.00    Pack years: 20.00    Types: Cigarettes  . Smokeless tobacco: Never Used  . Tobacco comment: 7 cigs a day   Substance and Sexual Activity  . Alcohol use: Yes    Alcohol/week: 7.0 standard drinks    Types: 7  Glasses of wine per week    Comment: 7 glasses of wine per week, one per day  . Drug use: No  . Sexual activity: Not Currently  Lifestyle  . Physical activity    Days per week: Not on file    Minutes per session: Not on file  . Stress: Not on file  Relationships  . Social Herbalist on phone: Not on file    Gets together: Not on file    Attends religious service: Not on file    Active member of club or organization: Not on file    Attends meetings of clubs or organizations: Not on file    Relationship status: Not on file  . Intimate partner violence    Fear of current or ex partner: Not on file    Emotionally abused: Not on file    Physically abused: Not on file    Forced sexual activity: Not on file  Other Topics Concern  . Not on file  Social History Narrative  . Not on file    Family History  Problem Relation Age of Onset  . Hypertension Mother   . Stroke Mother   . Hypertension Sister   . Colon cancer Neg Hx   . Esophageal cancer Neg Hx   . Rectal cancer Neg Hx   . Stomach cancer Neg Hx   . Pancreatic cancer Neg Hx   . Prostate cancer Neg Hx     Past Surgical History:  Procedure Laterality Date  . DENTAL SURGERY    . TOTAL HIP ARTHROPLASTY Right 10/08/2015   Procedure: RIGHT TOTAL HIP ARTHROPLASTY ANTERIOR APPROACH;  Surgeon: Mcarthur Rossetti, MD;  Location: [facility];  Service: Orthopedics;  Laterality: Right;  [facility] Kitchen VAGINAL DELIVERY     41 yrs ago    ROS: Review of Systems Negative except as stated above  PHYSICAL EXAM: BP 130/86   Pulse 79   Temp 98.2 F (36.8 C) (Oral)   Resp 16   Wt 152 lb 12.8 oz (69.3 kg)   SpO2 99%   BMI 30.86 kg/m   Wt Readings from Last 3 Encounters:  05/04/19 152 lb 12.8 oz (69.3 kg)  02/22/18 146 lb 12.8 oz (66.6 kg)  10/26/17 146 lb (66.2 kg)    Physical Exam Constitutional:      General: She is not in acute distress.    Appearance: Normal appearance.  HENT:     Nose:     Comments: Mild enlargement of  the nasal turbinates on the right side.  Nasal mucosa  is dry.    Mouth/Throat:     Mouth: Mucous membranes are moist.     Pharynx: Oropharynx is clear. No posterior oropharyngeal erythema.  Cardiovascular:     Rate and Rhythm: Normal rate and regular rhythm.     Comments: No lower extremity edema Pulmonary:     Effort: Pulmonary effort is normal.     Breath sounds: Normal breath sounds.  Lymphadenopathy:     Cervical: No cervical adenopathy.  Neurological:     Mental Status: She is alert.    CMP Latest Ref Rng & Units 03/21/2019 12/21/2016 12/02/2015  Glucose 65 - 99 mg/dL 104(H) 85 88  BUN 8 - 27 mg/dL 13 9 11   Creatinine 0.57 - 1.00 mg/dL 0.86 0.68 0.61  Sodium 134 - 144 mmol/L 137 138 137  Potassium 3.5 - 5.2 mmol/L 3.8 4.3 3.8  Chloride 96 - 106 mmol/L 103 100 103  CO2 20 - 29 mmol/L 24 24 27   Calcium 8.7 - 10.3 mg/dL 9.8 10.4(H) 9.8  Total Protein 6.0 - 8.5 g/dL 6.7 7.0 -  Total Bilirubin 0.0 - 1.2 mg/dL <0.2 0.3 -  Alkaline Phos 39 - 117 IU/L 83 77 -  AST 0 - 40 IU/L 12 10 -  ALT 0 - 32 IU/L 12 8 -   Lipid Panel     Component Value Date/Time   CHOL 208 (H) 03/21/2019 1522   TRIG 285 (H) 03/21/2019 1522   HDL 41 03/21/2019 1522   CHOLHDL 5.1 (H) 03/21/2019 1522   CHOLHDL 3.1 03/14/2014 1102   VLDL 15 03/14/2014 1102   LDLCALC 117 (H) 03/21/2019 1522    CBC    Component Value Date/Time   WBC 8.6 03/21/2019 1522   WBC 9.9 10/10/2015 0520   RBC 4.17 03/21/2019 1522   RBC 3.28 (L) 10/10/2015 0520   HGB 11.0 (L) 03/21/2019 1522   HCT 35.3 03/21/2019 1522   PLT 413 03/21/2019 1522   MCV 85 03/21/2019 1522   MCH 26.4 (L) 03/21/2019 1522   MCH 25.0 (L) 10/10/2015 0520   MCHC 31.2 (L) 03/21/2019 1522   MCHC 31.5 10/10/2015 0520   RDW 15.9 (H) 03/21/2019 1522   LYMPHSABS 3.0 03/21/2019 1522   MONOABS 0.9 03/14/2014 1102   EOSABS 0.1 03/21/2019 1522   BASOSABS 0.0 03/21/2019 1522    ASSESSMENT AND PLAN:  1. Essential hypertension Not at goal.  DASH diet  discussed and encouraged.  Increase carvedilol to 6.25 mg twice a day - carvedilol (COREG) 6.25 MG tablet; Take 1 tablets  by mouth 2 (two) times daily with a meal.  Dispense: 180 tablet; Refill: 2 - lisinopril-hydrochlorothiazide (ZESTORETIC) 20-25 MG tablet; Take 1 tablet by mouth daily.  Dispense: 90 tablet; Refill: 2  2. Tobacco dependence Advised to quit.  Patient not ready to do so.  Less than 5 minutes spent on counseling  3. Obesity (BMI 30-39.9) Healthy eating habits discussed.  Advised to cut out sugary drinks, decrease portion sizes of white carbohydrates, incorporate fresh fruits and vegetables into the diet daily and to eat more white meat than red meat  4. Mixed hyperlipidemia Discuss lab results with her.  Encourage healthy eating habits and regular exercise.  She will send me a copy of the most recent cholesterol test that she had done through her job once she gets it back  5. Sinus congestion - fluticasone (FLONASE) 50 MCG/ACT nasal spray; Place 1 spray into both nostrils daily as needed for allergies or rhinitis.  Dispense: 16  g; Refill: 1  6. Influenza vaccination declined    Patient was given the opportunity to ask questions.  Patient verbalized understanding of the plan and was able to repeat key elements of the plan.   No orders of the defined types were placed in this encounter.    Requested Prescriptions   Signed Prescriptions Disp Refills  . lisinopril-hydrochlorothiazide (ZESTORETIC) 20-25 MG tablet 90 tablet 2    Sig: Take 1 tablet by mouth daily.  . fluticasone (FLONASE) 50 MCG/ACT nasal spray 16 g 1    Sig: Place 1 spray into both nostrils daily as needed for allergies or rhinitis.  . carvedilol (COREG) 6.25 MG tablet 180 tablet 2    Sig: Take 1 tablet (6.25 mg total) by mouth 2 (two) times daily with a meal.    Return in about 4 months (around 09/01/2019).  Karle Plumber, MD, FACP

## 2019-05-04 NOTE — Patient Instructions (Addendum)
Your blood pressure is not at goal.  Increase carvedilol to 6.25 mg twice a day.  Continue to check your blood pressure.  Goal is 130/80 or lower.

## 2019-06-28 ENCOUNTER — Other Ambulatory Visit: Payer: Self-pay | Admitting: Internal Medicine

## 2019-06-28 DIAGNOSIS — G4700 Insomnia, unspecified: Secondary | ICD-10-CM

## 2019-06-28 NOTE — Telephone Encounter (Signed)
Refill request. Last filled by walkin

## 2019-08-21 ENCOUNTER — Telehealth: Payer: Self-pay | Admitting: Internal Medicine

## 2019-08-21 DIAGNOSIS — I10 Essential (primary) hypertension: Secondary | ICD-10-CM

## 2019-08-21 MED ORDER — CARVEDILOL 6.25 MG PO TABS: 6.2500 mg | ORAL_TABLET | Freq: Two times a day (BID) | ORAL | 0 refills | Status: DC

## 2019-08-21 NOTE — Telephone Encounter (Signed)
Contacted pt stated her BP meds are not working for the past month/ and in 3 Days will no longer have Coreg medication. Needs refill .  Pt stated her BP reading for the past month are in the upper 140/90 . Systolic pressure rinsing no higher than [id number] and dyastolic In 97 range.  Pt has an upcoming appt with you on the 08/29/2019 at 3:50pm.    Denies any distress. Denies headache, chest pain , SOB or difficulty breathing. Blurry vision, slurred speech. Instructed pt if start experiencing above symptoms to go to UC/ ED for further evaluation. Verbalized understanding  Please advice!

## 2019-08-21 NOTE — Telephone Encounter (Signed)
Could you contact pt  

## 2019-08-21 NOTE — Telephone Encounter (Signed)
Patient called to inform pcp that her Blood pressure medication may need to be adjusted, currently not working well. [place] on [address]

## 2019-08-22 NOTE — Telephone Encounter (Signed)
Called pt made aware of pending med at the pharmacy and the imp to keep f /u appt/ unable to state the Pulse at this time/ will call back with details

## 2019-08-29 ENCOUNTER — Other Ambulatory Visit: Payer: Self-pay

## 2019-08-29 ENCOUNTER — Encounter: Payer: Self-pay | Admitting: Internal Medicine

## 2019-08-29 ENCOUNTER — Ambulatory Visit: Payer: 59 | Attending: Internal Medicine | Admitting: Internal Medicine

## 2019-08-29 VITALS — BP 157/94 | HR 79 | Ht 58.5 in | Wt 152.0 lb

## 2019-08-29 DIAGNOSIS — I10 Essential (primary) hypertension: Secondary | ICD-10-CM | POA: Diagnosis not present

## 2019-08-29 DIAGNOSIS — F17200 Nicotine dependence, unspecified, uncomplicated: Secondary | ICD-10-CM

## 2019-08-29 DIAGNOSIS — E782 Mixed hyperlipidemia: Secondary | ICD-10-CM

## 2019-08-29 DIAGNOSIS — K219 Gastro-esophageal reflux disease without esophagitis: Secondary | ICD-10-CM | POA: Diagnosis not present

## 2019-08-29 DIAGNOSIS — G4700 Insomnia, unspecified: Secondary | ICD-10-CM

## 2019-08-29 DIAGNOSIS — R0981 Nasal congestion: Secondary | ICD-10-CM

## 2019-08-29 MED ORDER — AMLODIPINE BESYLATE 5 MG PO TABS: 5.0000 mg | ORAL_TABLET | Freq: Every day | ORAL | 0 refills | Status: DC

## 2019-08-29 MED ORDER — OMEPRAZOLE 20 MG PO CPDR: 20.0000 mg | DELAYED_RELEASE_CAPSULE | Freq: Every day | ORAL | 3 refills | Status: DC

## 2019-08-29 MED ORDER — AMLODIPINE BESYLATE 5 MG PO TABS: 5.0000 mg | ORAL_TABLET | Freq: Every day | ORAL | 3 refills | Status: DC

## 2019-08-29 MED ORDER — HYDROXYZINE HCL 25 MG PO TABS: ORAL_TABLET | ORAL | 3 refills | Status: DC

## 2019-08-29 MED ORDER — FLUTICASONE PROPIONATE 50 MCG/ACT NA SUSP
1.0000 | Freq: Every day | NASAL | 3 refills | Status: AC | PRN
Start: 2019-08-29 — End: ?

## 2019-08-29 NOTE — Progress Notes (Signed)
Virtual Visit via Telephone Note Due to current restrictions/limitations of in-office visits due to the COVID-19 pandemic, this scheduled clinical appointment was converted to a telehealth visit  I connected with Martha Manning on 08/29/19 at  3:50 PM EST by telephone and verified that I am speaking with the correct person using two identifiers. I am in my office.  The patient is at home.  Only the patient and myself participated in this encounter.  I discussed the limitations, risks, security and privacy concerns of performing an evaluation and management service by telephone and the availability of in person appointments. I also discussed with the patient that there may be a patient responsible charge related to this service. The patient expressed understanding and agreed to proceed.   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  Patient last evaluated 04/2019.  Purpose of today's visit is chronic disease management .  HYPERTENSION Currently taking: see medication list Med Adherence: [x]  Yes -patient on lisinopril/HCTZ and carvedilol.  She had called a few weeks ago stating that her blood pressure continues to run high and she feels the carvedilol is not working.  She was previously on lisinopril/HCTZ and Norvasc.  Reports her blood pressure was better when she was taking the Norvasc instead of the carvedilol.  The carvedilol also makes her feel winded when she walks upstairs.  We had stopped carvedilol last year because once we increase the dose to 10 mg she started experiencing lower extremity edema. Medication side effects: [x]  Yes    []  No Adherence with salt restriction: [x]  Yes    []  No Home Monitoring?: [x]  Yes    []  No Monitoring Frequency: []  Yes    []  No Home BP results range: 150s over 90s SOB? []  Yes    [x]  No Chest Pain?: []  Yes    [x]  No Leg swelling?: []  Yes    [x]  No Headaches?: []  Yes    [x]  No Dizziness? []   Yes    [x]  No Comments:   Obesity: She has lost 7 pounds since last visit with me.  She is doing a keto diet.  Since the beginning of this year she started going to the gym 3 times a week to work out.  HL: On last visit she told me that she had lipid profile done through her work and she would have them send me the results.  I never received the results.  However she says the LDL cholesterol was 113 on the a few points lower than what we got when we had checked her last year.  She is doing better with her eating habits.  Tobacco dependence: She continues to smoke.  She is not ready to quit.  Requesting refill on omeprazole.  States she does okay on the medication as long as she avoids spicy and greasy foods  She is also requesting refill on hydralazine which she takes for insomnia and Flonase for sinus congestion. Outpatient Encounter Medications as of 08/29/2019  Medication Sig  . aspirin 81 MG chewable tablet Chew 81 mg by mouth daily.  . Aspirin-Acetaminophen-Caffeine (GOODY HEADACHE PO) Take by mouth as needed.  . carvedilol (COREG) 6.25 MG tablet Take 1 tablet (6.25 mg total) by mouth 2 (two) times daily with a meal.  . fluticasone (FLONASE) 50 MCG/ACT nasal spray Place 1 spray into both nostrils daily as needed for allergies or rhinitis.  . hydrOXYzine (ATARAX/VISTARIL) 25 MG tablet TAKE 1 TO  2 TABLETS BY MOUTH EVERY DAY AT BEDTIME AS NEEDED  . lisinopril-hydrochlorothiazide (ZESTORETIC) 20-25 MG tablet Take 1 tablet by mouth daily.  Marland Kitchen loratadine (ALLERGY RELIEF) 10 MG tablet Take 10 mg by mouth daily as needed for allergies or itching.  . Multiple Vitamin (MULTIVITAMIN WITH MINERALS) TABS tablet Take 1 tablet by mouth daily as needed (for supplementation). Reported on 12/02/2015  . omeprazole (PRILOSEC) 20 MG capsule Take 1 capsule (20 mg total) by mouth daily.   No facility-administered encounter medications on file as of 08/29/2019.      Observations/Objective:  Pressure today  157/94, pulse 79, Results for orders placed or performed in visit on 03/21/19  CBC with Differential/Platelet  Result Value Ref Range   WBC 8.6 3.4 - 10.8 x10E3/uL   RBC 4.17 3.77 - 5.28 x10E6/uL   Hemoglobin 11.0 (L) 11.1 - 15.9 g/dL   Hematocrit 35.3 34.0 - 46.6 %   MCV 85 79 - 97 fL   MCH 26.4 (L) 26.6 - 33.0 pg   MCHC 31.2 (L) 31.5 - 35.7 g/dL   RDW 15.9 (H) 11.7 - 15.4 %   Platelets 413 150 - 450 x10E3/uL   Neutrophils 54 Not Estab. %   Lymphs 35 Not Estab. %   Monocytes 8 Not Estab. %   Eos 2 Not Estab. %   Basos 1 Not Estab. %   Neutrophils Absolute 4.7 1.4 - 7.0 x10E3/uL   Lymphocytes Absolute 3.0 0.7 - 3.1 x10E3/uL   Monocytes Absolute 0.7 0.1 - 0.9 x10E3/uL   EOS (ABSOLUTE) 0.1 0.0 - 0.4 x10E3/uL   Basophils Absolute 0.0 0.0 - 0.2 x10E3/uL   Immature Granulocytes 0 Not Estab. %   Immature Grans (Abs) 0.0 0.0 - 0.1 x10E3/uL  Lipid panel  Result Value Ref Range   Cholesterol, Total 208 (H) 100 - 199 mg/dL   Triglycerides 285 (H) 0 - 149 mg/dL   HDL 41 >39 mg/dL   VLDL Cholesterol Cal 50 (H) 5 - 40 mg/dL   LDL Chol Calc (NIH) 117 (H) 0 - 99 mg/dL   Chol/HDL Ratio 5.1 (H) 0.0 - 4.4 ratio  Comprehensive metabolic panel  Result Value Ref Range   Glucose 104 (H) 65 - 99 mg/dL   BUN 13 8 - 27 mg/dL   Creatinine, Ser 0.86 0.57 - 1.00 mg/dL   GFR calc non Af Amer 73 >59 mL/min/1.73   GFR calc Af Amer 84 >59 mL/min/1.73   BUN/Creatinine Ratio 15 12 - 28   Sodium 137 134 - 144 mmol/L   Potassium 3.8 3.5 - 5.2 mmol/L   Chloride 103 96 - 106 mmol/L   CO2 24 20 - 29 mmol/L   Calcium 9.8 8.7 - 10.3 mg/dL   Total Protein 6.7 6.0 - 8.5 g/dL   Albumin 4.3 3.8 - 4.8 g/dL   Globulin, Total 2.4 1.5 - 4.5 g/dL   Albumin/Globulin Ratio 1.8 1.2 - 2.2   Bilirubin Total <0.2 0.0 - 1.2 mg/dL   Alkaline Phosphatase 83 39 - 117 IU/L   AST 12 0 - 40 IU/L   ALT 12 0 - 32 IU/L   The 10-year ASCVD risk score Mikey Bussing DC Jr., et al., 2013) is: 28.2%   Values used to calculate the score:      Age: 5 years     Sex: Female     Is Non-Hispanic African American: Yes     Diabetic: No     Tobacco smoker: Yes     Systolic Blood Pressure:  157 mmHg     Is BP treated: Yes     HDL Cholesterol: 41 mg/dL     Total Cholesterol: 208 mg/dL   Assessment and Plan: 1. Essential hypertension Not at goal. We discussed options of adding back the low-dose of amlodipine but continuing lisinopril/HCTZ and carvedilol.  Patient states she does not wish to be on that many blood pressure medications.  She would prefer to continue the lisinopril/HCTZ and go back on low-dose amlodipine.  She wants to discontinue the carvedilol.  We will discontinue carvedilol.  Advised to continue to check blood pressure with goal being 130/80 or lower. - amLODipine (NORVASC) 5 MG tablet; Take 1 tablet (5 mg total) by mouth daily.  Dispense: 90 tablet; Refill: 3 - amLODipine (NORVASC) 5 MG tablet; Take 1 tablet (5 mg total) by mouth daily.  Dispense: 30 tablet; Refill: 0  2. Mixed hyperlipidemia -Her ASCVD score is 28%.  Informed patient that this puts her at risk for heart attack and stroke.  She is not interested in being on medication for cholesterol and does not wish to have cholesterol rechecked again at this time.  She wants to work more on getting her weight down and then rechecking the cholesterol in several months.  3. Tobacco dependence Advised to quit.  Patient not ready to give a trial of quitting.  Less than 5 minutes spent on counseling.  4. Gastroesophageal reflux disease without esophagitis - omeprazole (PRILOSEC) 20 MG capsule; Take 1 capsule (20 mg total) by mouth daily.  Dispense: 90 capsule; Refill: 3  5. Insomnia, unspecified type - hydrOXYzine (ATARAX/VISTARIL) 25 MG tablet; TAKE 1 TO 2 TABLETS BY MOUTH EVERY DAY AT BEDTIME AS NEEDED  Dispense: 180 tablet; Refill: 3  6. Sinus congestion - fluticasone (FLONASE) 50 MCG/ACT nasal spray; Place 1 spray into both nostrils daily as needed for allergies  or rhinitis.  Dispense: 16 g; Refill: 3   Follow Up Instructions: F/u in 4 mths   I discussed the assessment and treatment plan with the patient. The patient was provided an opportunity to ask questions and all were answered. The patient agreed with the plan and demonstrated an understanding of the instructions.   The patient was advised to call back or seek an in-person evaluation if the symptoms worsen or if the condition fails to improve as anticipated.  I provided 20 minutes of non-face-to-face time during this encounter.   Karle Plumber, MD

## 2019-09-11 ENCOUNTER — Ambulatory Visit (INDEPENDENT_AMBULATORY_CARE_PROVIDER_SITE_OTHER): Payer: 59 | Admitting: Physician Assistant

## 2019-09-11 ENCOUNTER — Encounter: Payer: Self-pay | Admitting: Physician Assistant

## 2019-09-11 ENCOUNTER — Other Ambulatory Visit: Payer: Self-pay

## 2019-09-11 ENCOUNTER — Ambulatory Visit: Payer: Self-pay

## 2019-09-11 DIAGNOSIS — M25551 Pain in right hip: Secondary | ICD-10-CM

## 2019-09-11 DIAGNOSIS — M545 Low back pain, unspecified: Secondary | ICD-10-CM

## 2019-09-11 DIAGNOSIS — M25552 Pain in left hip: Secondary | ICD-10-CM

## 2019-09-11 MED ORDER — METHYLPREDNISOLONE 4 MG PO TABS: ORAL_TABLET | ORAL | 0 refills | Status: DC

## 2019-09-11 MED ORDER — CYCLOBENZAPRINE HCL 10 MG PO TABS: 10.0000 mg | ORAL_TABLET | Freq: Every day | ORAL | 0 refills | Status: DC

## 2019-09-11 NOTE — Progress Notes (Signed)
Office Visit Note   Patient: Martha Manning           Date of Birth: May 02, 1956           MRN: [id number] Visit Date: 09/11/2019              Requested by: Ladell Pier, MD [address] [place],  [place] [address] PCP: Ladell Pier, MD   Assessment & Plan: Visit Diagnoses:  1. Acute right-sided low back pain, unspecified whether sciatica present   2. Pain in right hip     Plan: We will place her on a Medrol Dosepak and Flexeril at night.  She is given back exercises to perform handouts were reviewed with her.  Offered formal therapy she defers.  She may benefit from a trochanteric injection right hip the future.  If she continues to have radicular symptoms down the right thigh recommend MRI to rule out HNP as a source of her pain.  Follow-Up Instructions: Return in about 4 weeks (around 10/09/2019).   Orders:  Orders Placed This Encounter  Procedures  . XR Lumbar Spine 2-3 Views  . XR HIP UNILAT W OR W/O PELVIS 2-3 VIEWS RIGHT   Meds ordered this encounter  Medications  . methylPREDNISolone (MEDROL) 4 MG tablet    Sig: Take as directed    Dispense:  21 tablet    Refill:  0  . cyclobenzaprine (FLEXERIL) 10 MG tablet    Sig: Take 1 tablet (10 mg total) by mouth at bedtime.    Dispense:  30 tablet    Refill:  0      Procedures: No procedures performed   Clinical Data: No additional findings.   Subjective: Chief Complaint  Patient presents with  . Right Thigh - Pain    HPI Mrs. Martha Manning is well-known to our department service comes in today for right thigh soreness.  Pain is anterior medial aspect of the right thigh only.  Pain goes from the groin to the midline over.  She denies any numbness tingling down the leg.  Denies any injury.  Pains been ongoing since mid January.  She is status post right total hip arthroplasty 10/08/2015.  She has tried ibuprofen with some pain relief.  She does note she has some back pain.  No saddle anesthesia  symptoms.  Review of Systems See HPI  Objective: Vital Signs: There were no vitals taken for this visit.  Physical Exam Constitutional:      Appearance: She is not ill-appearing or diaphoretic.  Pulmonary:     Effort: Pulmonary effort is normal.  Neurological:     Mental Status: She is oriented to person, place, and time.  Psychiatric:        Behavior: Behavior normal.     Ortho Exam Lower extremities 5 out of 5 strength throughout against resistance.  Negative straight leg raise bilaterally.  Good range of motion bilateral hips without pain.  Tenderness over the right trochanteric region.  Also tenderness over the lower lumbar paraspinous region.  She has good flexion-extension lumbar spine without pain. Specialty Comments:  No specialty comments available.  Imaging: XR HIP UNILAT W OR W/O PELVIS 2-3 VIEWS RIGHT  Result Date: 09/11/2019 AP pelvis lateral view of the right hip: Bilateral hips well located.  Right total hip arthroplasty components well-seated.  No acute fractures no bony abnormalities.  XR Lumbar Spine 2-3 Views  Result Date: 09/11/2019 Lumbar spine AP lateral views: No acute fractures.  No spondylolisthesis.  L2-3 and L3-4 disc space narrowing.  Spurring off the anterior vertebral bodies L2, L3 and L4.    PMFS History: Patient Active Problem List   Diagnosis Date Noted  . Obesity (BMI 30-39.9) 05/04/2019  . Mixed hyperlipidemia 05/04/2019  . Sinus congestion 05/04/2019  . Gastroesophageal reflux disease without esophagitis 07/26/2018  . Diabetes mellitus screening 12/21/2016  . Status post total replacement of right hip 10/08/2015  . Ganglion of left wrist 05/16/2015  . Essential hypertension 03/14/2014  . Osteoarthritis of right hip 03/14/2014  . Smoking 03/14/2014   Past Medical History:  Diagnosis Date  . Allergy   . Arthritis    "right hip" (10/08/2015)  . GERD (gastroesophageal reflux disease)    depends on diet   . Hypertension      Family History  Problem Relation Age of Onset  . Hypertension Mother   . Stroke Mother   . Hypertension Sister   . Colon cancer Neg Hx   . Esophageal cancer Neg Hx   . Rectal cancer Neg Hx   . Stomach cancer Neg Hx   . Pancreatic cancer Neg Hx   . Prostate cancer Neg Hx     Past Surgical History:  Procedure Laterality Date  . DENTAL SURGERY    . TOTAL HIP ARTHROPLASTY Right 10/08/2015   Procedure: RIGHT TOTAL HIP ARTHROPLASTY ANTERIOR APPROACH;  Surgeon: Mcarthur Rossetti, MD;  Location: [facility];  Service: Orthopedics;  Laterality: Right;  Marland Kitchen VAGINAL DELIVERY     41 yrs ago   Social History   Occupational History  . Not on file  Tobacco Use  . Smoking status: Current Every Day Smoker    Packs/day: 0.50    Years: 40.00    Pack years: 20.00    Types: Cigarettes  . Smokeless tobacco: Never Used  . Tobacco comment: 7 cigs a day   Substance and Sexual Activity  . Alcohol use: Yes    Alcohol/week: 7.0 standard drinks    Types: 7 Glasses of wine per week    Comment: 7 glasses of wine per week, one per day  . Drug use: No  . Sexual activity: Not Currently

## 2019-10-09 ENCOUNTER — Encounter: Payer: Self-pay | Admitting: Physician Assistant

## 2019-10-09 ENCOUNTER — Ambulatory Visit (INDEPENDENT_AMBULATORY_CARE_PROVIDER_SITE_OTHER): Payer: 59 | Admitting: Physician Assistant

## 2019-10-09 ENCOUNTER — Other Ambulatory Visit: Payer: Self-pay

## 2019-10-09 DIAGNOSIS — M7061 Trochanteric bursitis, right hip: Secondary | ICD-10-CM | POA: Diagnosis not present

## 2019-10-09 MED ORDER — METHYLPREDNISOLONE ACETATE 40 MG/ML IJ SUSP
40.0000 mg | INTRAMUSCULAR | Status: AC | PRN
  Administered 2019-10-09: 40 mg via INTRA_ARTICULAR

## 2019-10-09 MED ORDER — LIDOCAINE HCL 1 % IJ SOLN
3.0000 mL | INTRAMUSCULAR | Status: AC | PRN
  Administered 2019-10-09: 3 mL

## 2019-10-09 NOTE — Progress Notes (Signed)
   Procedure Note  Patient: Martha Manning             Date of Birth: 09-Jul-1955           MRN: [id number]             Visit Date: 10/09/2019  HPI: Ms. Gonya returns today follow-up of her low back pain and right hip pain.  She states that the stretches and medication are beneficial.  Only complaint she has today is still some discomfort in the groin area and the lateral aspect of her right hip.  States she has difficulty getting in and out of the car pulling sensation in the hip.Marland Kitchen  Physical exam: Right hip excellent range of motion without pain.  She has significant tenderness with palpation over the right trochanteric region.  There is no rashes skin lesions ulcerations over the right trochanteric region.  Procedures: Visit Diagnoses:  1. Trochanteric bursitis, right hip     Large Joint Inj: R greater trochanter on 10/09/2019 4:06 PM Indications: pain Details: 22 G 1.5 in needle, lateral approach  Arthrogram: No  Medications: 3 mL lidocaine 1 %; 40 mg methylPREDNISolone acetate 40 MG/ML Outcome: tolerated well, no immediate complications Procedure, treatment alternatives, risks and benefits explained, specific risks discussed. Consent was given by the patient. Immediately prior to procedure a time out was called to verify the correct patient, procedure, equipment, support staff and site/side marked as required. Patient was prepped and draped in the usual sterile fashion.     Plan: She will continue work on range of motion strengthening of the right hip.  Work on IT band stretching.  She will follow-up with [place] as needed.  Pain persist or becomes worse she will return.  Questions were encouraged.

## 2019-12-29 ENCOUNTER — Ambulatory Visit: Payer: 59 | Admitting: Internal Medicine

## 2020-04-08 ENCOUNTER — Ambulatory Visit: Payer: 59 | Admitting: Internal Medicine

## 2020-04-12 ENCOUNTER — Ambulatory Visit: Payer: 59 | Admitting: Internal Medicine

## 2020-04-30 ENCOUNTER — Other Ambulatory Visit: Payer: Self-pay | Admitting: Internal Medicine

## 2020-04-30 DIAGNOSIS — Z0000 Encounter for general adult medical examination without abnormal findings: Secondary | ICD-10-CM

## 2020-05-07 ENCOUNTER — Other Ambulatory Visit: Payer: Self-pay | Admitting: Internal Medicine

## 2020-05-07 DIAGNOSIS — I10 Essential (primary) hypertension: Secondary | ICD-10-CM

## 2020-05-07 NOTE — Telephone Encounter (Signed)
Requested medications are due for refill today?  Yes  Requested medications are on active medication list?  Yes  Last Refill:   05/04/2019  # 90 with 2 refills   Future visit scheduled? Yes in one month  Notes to Clinic:  Medication failed Rx refill protocol due to no valid encounter in the past 6 months and no labs within the past 180 days.  Last office visit was 8 months ago and last labs were performed on 03/21/2019.

## 2020-06-07 ENCOUNTER — Other Ambulatory Visit: Payer: Self-pay

## 2020-06-07 ENCOUNTER — Ambulatory Visit: Payer: 59 | Attending: Internal Medicine | Admitting: Internal Medicine

## 2020-06-07 ENCOUNTER — Ambulatory Visit: Payer: 59

## 2020-06-07 ENCOUNTER — Encounter: Payer: Self-pay | Admitting: Internal Medicine

## 2020-06-07 VITALS — BP 120/82 | HR 93 | Resp 16 | Ht <= 58 in | Wt 136.6 lb

## 2020-06-07 DIAGNOSIS — M542 Cervicalgia: Secondary | ICD-10-CM

## 2020-06-07 DIAGNOSIS — F17200 Nicotine dependence, unspecified, uncomplicated: Secondary | ICD-10-CM

## 2020-06-07 DIAGNOSIS — I10 Essential (primary) hypertension: Secondary | ICD-10-CM

## 2020-06-07 DIAGNOSIS — E663 Overweight: Secondary | ICD-10-CM | POA: Diagnosis not present

## 2020-06-07 DIAGNOSIS — Z2821 Immunization not carried out because of patient refusal: Secondary | ICD-10-CM

## 2020-06-07 MED ORDER — MELOXICAM 15 MG PO TABS: 15.0000 mg | ORAL_TABLET | Freq: Every day | ORAL | 2 refills | Status: DC

## 2020-06-07 NOTE — Patient Instructions (Signed)
I have sent the prescription for meloxicam to [facility] per your request. Continue healthy eating habits and regular exercise.  Congratulations on your weight loss.  Keep up the good works.

## 2020-06-07 NOTE — Progress Notes (Signed)
Patient ID: Martha Manning, female    DOB: 13-Aug-1955  MRN: [id number]  CC: Hypertension   Subjective: Martha Manning is a 64 y.o. 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  She has been doing keto diet.  Exercise 3x/wk -40 mins aerobic and 20 mins strength training  HYPERTENSION Currently taking: see medication list.  She is on Norvasc and lisinopril/HCTZ. Med Adherence: [x]  Yes    []  No Medication side effects: []  Yes    [x]  No Adherence with salt restriction: [x]  Yes    []  No Home Monitoring?: [x]  Yes    []  No Monitoring Frequency: Almost daily. Home BP results range: She has photos of blood pressure readings from her device on her cell phone for me to see.  Some of these readings are as follows: 124/79, 121/75, 137/81, 120/80, 115/78 SOB? []  Yes    [x]  No Chest Pain?: []  Yes    [x]  No Leg swelling?: []  Yes    [x]  No Headaches?: []  Yes    [x]  No Dizziness? []  Yes    [x]  No Comments:   Tob Dep: now at 5 cigarettes/day.  She is not ready to quit smoking.  Insomnia: Not taking hydroxyzine much anymore.  She sometimes takes it on weekends.  She states that it relaxes her but she wakes up after about 4 hours.  Gets intermittent pain and tightness around the neck and shoulder girdle.  Sometimes she may sleep with neck in the not so good position.  Other times she thinks it occurs from work.  She has used meloxicam in the past and felt that it works well for her.  She is requesting a prescription for that.  She has some Flexeril but states that she is not taking that.  She prefers meloxicam.    HM: decline flu  Patient Active Problem List   Diagnosis Date Noted  . Influenza vaccine refused 06/07/2020  . Obesity (BMI 30-39.9) 05/04/2019  . Mixed hyperlipidemia 05/04/2019  . Sinus congestion 05/04/2019  .  Gastroesophageal reflux disease without esophagitis 07/26/2018  . Diabetes mellitus screening 12/21/2016  . Status post total replacement of right hip 10/08/2015  . Ganglion of left wrist 05/16/2015  . Essential hypertension 03/14/2014  . Osteoarthritis of right hip 03/14/2014  . Smoking 03/14/2014     Current Outpatient Medications on File Prior to Visit  Medication Sig Dispense Refill  . amLODipine (NORVASC) 5 MG tablet Take 1 tablet (5 mg total) by mouth daily. 90 tablet 3  . aspirin 81 MG chewable tablet Chew 81 mg by mouth daily.    . hydrOXYzine (ATARAX/VISTARIL) 25 MG tablet TAKE 1 TO 2 TABLETS BY MOUTH EVERY DAY AT BEDTIME AS NEEDED 180 tablet 3  . lisinopril-hydrochlorothiazide (ZESTORETIC) 20-25 MG tablet TAKE 1 TABLET BY MOUTH  DAILY 90 tablet 0  . omeprazole (PRILOSEC) 20 MG capsule Take 1 capsule (20 mg total) by mouth daily. 90 capsule 3  . Aspirin-Acetaminophen-Caffeine (GOODY HEADACHE PO) Take by mouth as needed. (Patient not taking: Reported on 06/07/2020)    . fluticasone (FLONASE) 50 MCG/ACT nasal spray Place 1 spray into both nostrils daily as needed for allergies or rhinitis. (Patient not taking: Reported on 06/07/2020) 16 g 3  . loratadine (ALLERGY RELIEF) 10 MG tablet Take 10 mg by mouth daily as needed for allergies  or itching.    . Multiple Vitamin (MULTIVITAMIN WITH MINERALS) TABS tablet Take 1 tablet by mouth daily as needed (for supplementation). Reported on 12/02/2015     No current facility-administered medications on file prior to visit.    Allergies  Allergen Reactions  . Carvedilol     Makes her feel winded when she walks upstairs.  . Clindamycin/Lincomycin     Pt states she took this antibiotic once and broke out in a rash.   . Codeine   . Norvasc [Amlodipine Besylate] Swelling    Social History   Socioeconomic History  . Marital status: Divorced    Spouse name: Not on file  . Number of children: Not on file  . Years of education: Not on file   . Highest education level: Not on file  Occupational History  . Not on file  Tobacco Use  . Smoking status: Current Every Day Smoker    Packs/day: 0.50    Years: 40.00    Pack years: 20.00    Types: Cigarettes  . Smokeless tobacco: Never Used  . Tobacco comment: 7 cigs a day   Substance and Sexual Activity  . Alcohol use: Yes    Alcohol/week: 7.0 standard drinks    Types: 7 Glasses of wine per week    Comment: 7 glasses of wine per week, one per day  . Drug use: No  . Sexual activity: Not Currently  Other Topics Concern  . Not on file  Social History Narrative  . Not on file   Social Determinants of Health   Financial Resource Strain: Not on file  Food Insecurity: Not on file  Transportation Needs: Not on file  Physical Activity: Not on file  Stress: Not on file  Social Connections: Not on file  Intimate Partner Violence: Not on file    Family History  Problem Relation Age of Onset  . Hypertension Mother   . Stroke Mother   . Hypertension Sister   . Colon cancer Neg Hx   . Esophageal cancer Neg Hx   . Rectal cancer Neg Hx   . Stomach cancer Neg Hx   . Pancreatic cancer Neg Hx   . Prostate cancer Neg Hx     Past Surgical History:  Procedure Laterality Date  . DENTAL SURGERY    . TOTAL HIP ARTHROPLASTY Right 10/08/2015   Procedure: RIGHT TOTAL HIP ARTHROPLASTY ANTERIOR APPROACH;  Surgeon: Mcarthur Rossetti, MD;  Location: [facility];  Service: Orthopedics;  Laterality: Right;  Marland Kitchen VAGINAL DELIVERY     41 yrs ago    ROS: Review of Systems Negative except as stated above  PHYSICAL EXAM: BP 120/82   Pulse 93   Resp 16   Ht 4\' 10"  (1.473 m)   Wt 136 lb 9.6 oz (62 kg)   SpO2 97%   BMI 28.55 kg/m   Wt Readings from Last 3 Encounters:  06/07/20 136 lb 9.6 oz (62 kg)  08/29/19 152 lb (68.9 kg)  05/04/19 152 lb 12.8 oz (69.3 kg)    Physical Exam  General appearance - alert, well appearing, and in no distress Mental status - normal mood, behavior,  speech, dress, motor activity, and thought processes Neck - supple, no significant adenopathy Chest - clear to auscultation, no wheezes, rales or rhonchi, symmetric air entry Heart - normal rate, regular rhythm, normal S1, S2, no murmurs, rubs, clicks or gallops Extremities - peripheral pulses normal, no pedal edema, no clubbing or cyanosis   CMP Latest  Ref Rng & Units 03/21/2019 12/21/2016 12/02/2015  Glucose 65 - 99 mg/dL 104(H) 85 88  BUN 8 - 27 mg/dL 13 9 11   Creatinine 0.57 - 1.00 mg/dL 0.86 0.68 0.61  Sodium 134 - 144 mmol/L 137 138 137  Potassium 3.5 - 5.2 mmol/L 3.8 4.3 3.8  Chloride 96 - 106 mmol/L 103 100 103  CO2 20 - 29 mmol/L 24 24 27   Calcium 8.7 - 10.3 mg/dL 9.8 10.4(H) 9.8  Total Protein 6.0 - 8.5 g/dL 6.7 7.0 -  Total Bilirubin 0.0 - 1.2 mg/dL <0.2 0.3 -  Alkaline Phos 39 - 117 IU/L 83 77 -  AST 0 - 40 IU/L 12 10 -  ALT 0 - 32 IU/L 12 8 -   Lipid Panel     Component Value Date/Time   CHOL 208 (H) 03/21/2019 1522   TRIG 285 (H) 03/21/2019 1522   HDL 41 03/21/2019 1522   CHOLHDL 5.1 (H) 03/21/2019 1522   CHOLHDL 3.1 03/14/2014 1102   VLDL 15 03/14/2014 1102   LDLCALC 117 (H) 03/21/2019 1522    CBC    Component Value Date/Time   WBC 8.6 03/21/2019 1522   WBC 9.9 10/10/2015 0520   RBC 4.17 03/21/2019 1522   RBC 3.28 (L) 10/10/2015 0520   HGB 11.0 (L) 03/21/2019 1522   HCT 35.3 03/21/2019 1522   PLT 413 03/21/2019 1522   MCV 85 03/21/2019 1522   MCH 26.4 (L) 03/21/2019 1522   MCH 25.0 (L) 10/10/2015 0520   MCHC 31.2 (L) 03/21/2019 1522   MCHC 31.5 10/10/2015 0520   RDW 15.9 (H) 03/21/2019 1522   LYMPHSABS 3.0 03/21/2019 1522   MONOABS 0.9 03/14/2014 1102   EOSABS 0.1 03/21/2019 1522   BASOSABS 0.0 03/21/2019 1522    ASSESSMENT AND PLAN: 1. Essential hypertension Commended her on her blood pressure.  Home blood pressure readings are good.  Today diastolic blood pressure just a smidge above goal.  She will continue current medications and low-salt  diet - CBC - Comprehensive metabolic panel - Lipid panel  2. Overweight (BMI 25.0-29.9) Commended her on weight loss.  Encouraged her to continue healthy eating habits and regular exercise  3. Tobacco dependence Advised to quit.  Patient not ready to give a trial of quitting.  Less than 5 minutes spent on counseling.  4. Bilateral neck pain - meloxicam (MOBIC) 15 MG tablet; Take 1 tablet (15 mg total) by mouth daily.  Dispense: 30 tablet; Refill: 2  5. Influenza vaccine refused Recommended.  Patient declined.     Patient was given the opportunity to ask questions.  Patient verbalized understanding of the plan and was able to repeat key elements of the plan.   Orders Placed This Encounter  Procedures  . CBC  . Comprehensive metabolic panel  . Lipid panel     Requested Prescriptions   Signed Prescriptions Disp Refills  . meloxicam (MOBIC) 15 MG tablet 30 tablet 2    Sig: Take 1 tablet (15 mg total) by mouth daily.    Return in about 4 months (around 10/06/2020) for pap.  Karle Plumber, MD, FACP

## 2020-06-14 ENCOUNTER — Other Ambulatory Visit: Payer: Self-pay

## 2020-06-14 ENCOUNTER — Ambulatory Visit
Admission: RE | Admit: 2020-06-14 | Discharge: 2020-06-14 | Disposition: A | Discharge status: Home / Self Care | Payer: 59 | Source: Ambulatory Visit | Attending: Internal Medicine | Admitting: Internal Medicine

## 2020-06-14 DIAGNOSIS — Z0000 Encounter for general adult medical examination without abnormal findings: Secondary | ICD-10-CM

## 2020-06-17 LAB — SPECIMEN STATUS REPORT

## 2020-06-18 ENCOUNTER — Telehealth: Payer: Self-pay | Admitting: Internal Medicine

## 2020-06-18 NOTE — Telephone Encounter (Signed)
I received information from [facility] yesterday that they received her blood that was collected 10 days ago but that the sample was not run because there was no diagnosis code associated with it.  I spoke with our lab tech, and she contacted [facility] to figure out what is going on.  She tells me that she also sent a message to her supervisor.  In the meantime she advised that we probably try to get the patient back in because of the blood start in the main lab for 10 days it is too old to be ran.   Phone call placed to patient this a.m. I was unable to leave a message as her mailbox is full.  I will try her again later.

## 2020-06-19 ENCOUNTER — Telehealth: Payer: Self-pay

## 2020-06-19 ENCOUNTER — Telehealth: Payer: Self-pay | Admitting: Internal Medicine

## 2020-06-19 DIAGNOSIS — I10 Essential (primary) hypertension: Secondary | ICD-10-CM

## 2020-06-19 DIAGNOSIS — E782 Mixed hyperlipidemia: Secondary | ICD-10-CM

## 2020-06-19 LAB — CBC

## 2020-06-19 LAB — COMPREHENSIVE METABOLIC PANEL

## 2020-06-19 LAB — LIPID PANEL

## 2020-06-19 NOTE — Telephone Encounter (Signed)
I had reached out to our lab tech Steffanie Dunn 2 days ago after I received notification from [facility] that the blood that was sent on this patient 10 days prior was not ran because they did not receive information of what test were to be ran.  She notified her supervisor and was waiting for further information.  However she told me that the blood would be too old for them to run the test that we had ordered and that the patient would have to return to the lab.  I requested the name of her supervisor today so that I can call and speak with him or her myself.  I was provided the name Clarene Critchley [phone].  I called this person and left a voice message for phone call back.  I called the [phone] customer-service and requested to speak with a Freight forwarder.  I spoke with Mr. Satira Mccallum (Opt [address], Ext [phone]).  I explained the situation of the patient's blood being drawn on the 10th of this month by the lab tech in-house at our facility.  I received notice from [facility] 2 days ago stating that they received the specimen but no direction on which labs to run.  I informed him that specific labs were ordered and associated with diagnosis.  My issue was if this was the case why did he take 10 days for them to notify us of this when the blood would no longer be able to be used to run the testing.  He put me on hold to do some investigation.  He told me that they did receive the specimen but it looks as though it was sent to the wrong area for testing.  He said it seems it is an internal [facility] error.  I told him that I will be contacting the patient to inform her of this and have her come back for testing.  However I wanted to know whether she will be charged for the previous blood draw and whether she will be charged to have the blood drawn again and whether she will be charged for testing.  He told me that she will not be charged for the previous draw and he will look into it to see whether she will be charged to  come back for a draw and whether she will be charged to have the labs run.  He told me he will get back to me.  I provided him with my cell phone number.  I told him I will await his call and would like to hear from him within the next 24 hours.

## 2020-06-19 NOTE — Telephone Encounter (Signed)
Contacted pt to go over mm results pt didn't answer was unable to lvm due to vm full

## 2020-06-19 NOTE — Telephone Encounter (Signed)
Phone call placed to patient today to inform her about [facility] not running the test on the blood that was drawn on her recent visit.  I got her voicemail box but it was full and I was unable to leave a message.  I will send her a letter.

## 2020-06-21 ENCOUNTER — Telehealth: Payer: Self-pay | Admitting: Internal Medicine

## 2020-06-21 NOTE — Telephone Encounter (Signed)
Received call from Satira Mccallum of [facility] yesterday. He had called and left VMM on my cell phone.  I called him back same day.  He reports that the blood arrived late and that blood will have to be drawn again from the patient and she will not be charged for the previous or new redraw and testing.  Phone call placed to patient today.  I informed her that I had tried to reach her twice this week regarding the blood draw that we did on her recent visit.  I explained to her the situation of why her blood was not ran and that I have spoken with a manager at [facility].  Since it was an issue on their side, he agrees that she will not be charged for previous blood collection or for redraw and testing.  Patient was grateful for the information and thanked me for following up with [facility] personally.  She will try to get him sometime within the next few weeks to have her blood redrawn.

## 2020-09-14 ENCOUNTER — Other Ambulatory Visit: Payer: Self-pay | Admitting: Internal Medicine

## 2020-09-14 DIAGNOSIS — I10 Essential (primary) hypertension: Secondary | ICD-10-CM

## 2020-09-14 NOTE — Telephone Encounter (Signed)
Requested Prescriptions  Pending Prescriptions Disp Refills  . amLODipine (NORVASC) 5 MG tablet [Pharmacy Med Name: amLODIPine Besylate 5 MG Oral Tablet] 90 tablet 0    Sig: Take 1 tablet by mouth once daily     Cardiovascular:  Calcium Channel Blockers Passed - 09/14/2020  2:12 PM      Passed - Last BP in normal range    BP Readings from Last 1 Encounters:  06/07/20 120/82         Passed - Valid encounter within last 6 months    Recent Outpatient Visits          3 months ago Essential hypertension   [place], MD   1 year ago Essential hypertension   [place], Deborah B, MD   1 year ago Essential hypertension   [place], MD   1 year ago Insomnia, unspecified type   [place], Dionne Bucy, [place]   2 years ago Essential hypertension   [place], MD      Future Appointments            In 3 weeks Ladell Pier, MD [place]

## 2020-09-16 ENCOUNTER — Other Ambulatory Visit: Payer: Self-pay | Admitting: Internal Medicine

## 2020-09-16 DIAGNOSIS — I10 Essential (primary) hypertension: Secondary | ICD-10-CM

## 2020-09-16 MED ORDER — LISINOPRIL-HYDROCHLOROTHIAZIDE 20-25 MG PO TABS: 1.0000 | ORAL_TABLET | Freq: Every day | ORAL | 0 refills | Status: DC

## 2020-09-16 NOTE — Telephone Encounter (Signed)
Medication: lisinopril-hydrochlorothiazide (ZESTORETIC) 20-25 MG tablet  Has the pt contacted their pharmacy? No was sent to mail order last time  Preferred pharmacy: [facility] [place] - [address]  Please be advised refills may take up to 3 business days.  We ask that you follow up with your pharmacy.

## 2020-09-16 NOTE — Telephone Encounter (Signed)
Requested Prescriptions  Pending Prescriptions Disp Refills  . lisinopril-hydrochlorothiazide (ZESTORETIC) 20-25 MG tablet 90 tablet 0    Sig: Take 1 tablet by mouth daily.     Cardiovascular:  ACEI + Diuretic Combos Passed - 09/16/2020  1:09 PM      Passed - Na in normal range and within 180 days    Sodium  Date Value Ref Range Status  06/07/2020 WILL FOLLOW  Preliminary         Passed - K in normal range and within 180 days    Potassium  Date Value Ref Range Status  06/07/2020 WILL FOLLOW  Preliminary         Passed - Cr in normal range and within 180 days    Creat  Date Value Ref Range Status  12/02/2015 0.61 0.50 - 1.05 mg/dL Final   Creatinine, Ser  Date Value Ref Range Status  06/07/2020 WILL FOLLOW  Preliminary         Passed - Ca in normal range and within 180 days    Calcium  Date Value Ref Range Status  06/07/2020 WILL FOLLOW  Preliminary         Passed - Patient is not pregnant      Passed - Last BP in normal range    BP Readings from Last 1 Encounters:  06/07/20 120/82         Passed - Valid encounter within last 6 months    Recent Outpatient Visits          3 months ago Essential hypertension   [place], Deborah B, MD   1 year ago Essential hypertension   [place], Deborah B, MD   1 year ago Essential hypertension   [place], MD   1 year ago Insomnia, unspecified type   [place], Dionne Bucy, [place]   2 years ago Essential hypertension   [place], MD      Future Appointments            In 3 weeks Ladell Pier, MD [place]

## 2020-10-05 ENCOUNTER — Other Ambulatory Visit: Payer: Self-pay | Admitting: Physician Assistant

## 2020-10-05 DIAGNOSIS — K219 Gastro-esophageal reflux disease without esophagitis: Secondary | ICD-10-CM

## 2020-10-05 NOTE — Telephone Encounter (Signed)
Requested Prescriptions  Pending Prescriptions Disp Refills  . omeprazole (PRILOSEC) 20 MG capsule [Pharmacy Med Name: Omeprazole 20 MG Oral Capsule Delayed Release] 30 capsule 0    Sig: Take 1 capsule by mouth once daily     Gastroenterology: Proton Pump Inhibitors Passed - 10/05/2020 11:06 AM      Passed - Valid encounter within last 12 months    Recent Outpatient Visits          4 months ago Essential hypertension   [place], Deborah B, MD   1 year ago Essential hypertension   [place], Deborah B, MD   1 year ago Essential hypertension   [place], MD   1 year ago Insomnia, unspecified type   [place], Dionne Bucy, [place]   2 years ago Essential hypertension   [place], MD      Future Appointments            In 2 days Ladell Pier, MD [place]

## 2020-10-07 ENCOUNTER — Other Ambulatory Visit (HOSPITAL_COMMUNITY)
Admission: RE | Admit: 2020-10-07 | Discharge: 2020-10-07 | Disposition: A | Discharge status: Home / Self Care | Payer: BC Managed Care – PPO | Source: Ambulatory Visit | Attending: Internal Medicine | Admitting: Internal Medicine

## 2020-10-07 ENCOUNTER — Other Ambulatory Visit: Payer: Self-pay

## 2020-10-07 ENCOUNTER — Ambulatory Visit: Payer: BC Managed Care – PPO | Attending: Internal Medicine | Admitting: Internal Medicine

## 2020-10-07 ENCOUNTER — Encounter: Payer: Self-pay | Admitting: Internal Medicine

## 2020-10-07 VITALS — BP 112/76 | HR 83 | Resp 16 | Wt 137.0 lb

## 2020-10-07 DIAGNOSIS — N951 Menopausal and female climacteric states: Secondary | ICD-10-CM | POA: Diagnosis not present

## 2020-10-07 DIAGNOSIS — Z124 Encounter for screening for malignant neoplasm of cervix: Secondary | ICD-10-CM

## 2020-10-07 DIAGNOSIS — I10 Essential (primary) hypertension: Secondary | ICD-10-CM

## 2020-10-07 NOTE — Progress Notes (Signed)
Patient ID: Martha Manning, female    DOB: Mar 01, 1956  MRN: [id number]  CC: Gynecologic Exam   Subjective: Martha Manning is a 65 y.o. female who presents for PAP Her concerns today include:  history of HTN, tobacco dependence,obesity,OA of right hip status post THA, insomnia  Pt is G1P1 Abn pap in 1990s Postmenopausal - no vaginal bleeding.  Hot flashes have decreased over the years until the last 4 months.  She feels that her hot flashes have increased again.  No dischg or itching, +sexually active with same partner.  Declines STD screening. No fhx breast uterine or ovarian cancer.  HTN: compliant with meds.  Blood pressure today is good.  She was supposed to return to the lab to have blood test for CBC, chemistry and lipid profile that were ordered on last visit and were drawn but were not run by the lab. She would like to have them done today.   Patient Active Problem List   Diagnosis Date Noted  . Influenza vaccine refused 06/07/2020  . Overweight (BMI 25.0-29.9) 06/07/2020  . Obesity (BMI 30-39.9) 05/04/2019  . Mixed hyperlipidemia 05/04/2019  . Sinus congestion 05/04/2019  . Gastroesophageal reflux disease without esophagitis 07/26/2018  . Diabetes mellitus screening 12/21/2016  . Status post total replacement of right hip 10/08/2015  . Ganglion of left wrist 05/16/2015  . Essential hypertension 03/14/2014  . Osteoarthritis of right hip 03/14/2014  . Smoking 03/14/2014     Current Outpatient Medications on File Prior to Visit  Medication Sig Dispense Refill  . amLODipine (NORVASC) 5 MG tablet Take 1 tablet by mouth once daily 90 tablet 0  . aspirin 81 MG chewable tablet Chew 81 mg by mouth daily.    . fluticasone (FLONASE) 50 MCG/ACT nasal spray Place 1 spray into both nostrils daily as needed for allergies or rhinitis. (Patient not taking: Reported on 06/07/2020) 16 g 3  . hydrOXYzine (ATARAX/VISTARIL) 25 MG tablet TAKE 1 TO 2 TABLETS BY MOUTH EVERY DAY AT  BEDTIME AS NEEDED 180 tablet 3  . lisinopril-hydrochlorothiazide (ZESTORETIC) 20-25 MG tablet Take 1 tablet by mouth daily. 90 tablet 0  . loratadine (ALLERGY RELIEF) 10 MG tablet Take 10 mg by mouth daily as needed for allergies or itching.    . meloxicam (MOBIC) 15 MG tablet Take 1 tablet (15 mg total) by mouth daily. 30 tablet 2  . Multiple Vitamin (MULTIVITAMIN WITH MINERALS) TABS tablet Take 1 tablet by mouth daily as needed (for supplementation). Reported on 12/02/2015    . omeprazole (PRILOSEC) 20 MG capsule Take 1 capsule by mouth once daily 30 capsule 0   No current facility-administered medications on file prior to visit.    Allergies  Allergen Reactions  . Carvedilol     Makes her feel winded when she walks upstairs.  . Clindamycin/Lincomycin     Pt states she took this antibiotic once and broke out in a rash.   . Codeine   . Norvasc [Amlodipine Besylate] Swelling    Social History   Socioeconomic History  . Marital status: Divorced    Spouse name: Not on file  . Number of children: Not on file  . Years of education: Not on file  . Highest education level: Not on file  Occupational History  . Not on file  Tobacco Use  . Smoking status: Current Every Day Smoker    Packs/day: 0.50    Years: 40.00    Pack years: 20.00    Types: Cigarettes  .  Smokeless tobacco: Never Used  . Tobacco comment: 7 cigs a day   Substance and Sexual Activity  . Alcohol use: Yes    Alcohol/week: 7.0 standard drinks    Types: 7 Glasses of wine per week    Comment: 7 glasses of wine per week, one per day  . Drug use: No  . Sexual activity: Not Currently  Other Topics Concern  . Not on file  Social History Narrative  . Not on file   Social Determinants of Health   Financial Resource Strain: Not on file  Food Insecurity: Not on file  Transportation Needs: Not on file  Physical Activity: Not on file  Stress: Not on file  Social Connections: Not on file  Intimate Partner Violence:  Not on file    Family History  Problem Relation Age of Onset  . Hypertension Mother   . Stroke Mother   . Hypertension Sister   . Colon cancer Neg Hx   . Esophageal cancer Neg Hx   . Rectal cancer Neg Hx   . Stomach cancer Neg Hx   . Pancreatic cancer Neg Hx   . Prostate cancer Neg Hx     Past Surgical History:  Procedure Laterality Date  . DENTAL SURGERY    . TOTAL HIP ARTHROPLASTY Right 10/08/2015   Procedure: RIGHT TOTAL HIP ARTHROPLASTY ANTERIOR APPROACH;  Surgeon: Mcarthur Rossetti, MD;  Location: [facility];  Service: Orthopedics;  Laterality: Right;  Marland Kitchen VAGINAL DELIVERY     41 yrs ago    ROS: Review of Systems Negative except as stated above  PHYSICAL EXAM: BP 112/76   Pulse 83   Resp 16   Wt 137 lb (62.1 kg)   SpO2 99%   BMI 28.63 kg/m   Physical Exam  General appearance - alert, well appearing, and in no distress Mental status - normal mood, behavior, speech, dress, motor activity, and thought processes Pelvic -chaperone is [employer].  She has some atrophy of external genitalia, vulva, vaginal walls.  Cervix appears normal but the cervical os is stenosed.Marland Kitchen  CMP 06/07/2020 03/21/2019 12/21/2016  Glucose WILL FOLLOW 104(H) 85  BUN WILL FOLLOW 13 9  Creatinine WILL FOLLOW 0.86 0.68  Sodium WILL FOLLOW 137 138  Potassium WILL FOLLOW 3.8 4.3  Chloride WILL FOLLOW 103 100  CO2 WILL FOLLOW 24 24  Calcium WILL FOLLOW 9.8 10.4(H)  Total Protein WILL FOLLOW 6.7 7.0  Total Bilirubin WILL FOLLOW <0.2 0.3  Alkaline Phos WILL FOLLOW 83 77  AST WILL FOLLOW 12 10  ALT WILL FOLLOW 12 8   Lipid Panel     Component Value Date/Time   CHOL WILL FOLLOW 06/07/2020 1623   TRIG WILL FOLLOW 06/07/2020 1623   HDL WILL FOLLOW 06/07/2020 1623   CHOLHDL WILL FOLLOW 06/07/2020 1623   CHOLHDL 3.1 03/14/2014 1102   VLDL 15 03/14/2014 1102   LDLCALC WILL FOLLOW 06/07/2020 1623    CBC    Component Value Date/Time   WBC CANCELED 06/07/2020 1623   WBC 9.9 10/10/2015 0520    RBC CANCELED 06/07/2020 1623   RBC 3.28 (L) 10/10/2015 0520   HGB CANCELED 06/07/2020 1623   HCT CANCELED 06/07/2020 1623   PLT CANCELED 06/07/2020 1623   MCV 85 03/21/2019 1522   MCH 26.4 (L) 03/21/2019 1522   MCH 25.0 (L) 10/10/2015 0520   MCHC 31.2 (L) 03/21/2019 1522   MCHC 31.5 10/10/2015 0520   RDW 15.9 (H) 03/21/2019 1522   LYMPHSABS CANCELED 06/07/2020 1623  MONOABS 0.9 03/14/2014 1102   EOSABS CANCELED 06/07/2020 1623   BASOSABS CANCELED 06/07/2020 1623    ASSESSMENT AND PLAN: 1. Pap smear for cervical cancer screening - Cytology - PAP  2. Hot flashes, menopausal We discussed medications that can help manage her symptoms.  Not a good candidate for hormone replacement therapy given her age and the fact that she is a smoker.  We discussed trying her with low-dose gabapentin.  However her insurance does not cover this medication.  She is not interested in trying an SSRI or SNRI.  We discussed natural things that she can try at [employer] or [employer] over-the-counter.  3. Essential hypertension At goal.  Continue current medications - CBC - Comprehensive metabolic panel - Lipid panel   Patient was given the opportunity to ask questions.  Patient verbalized understanding of the plan and was able to repeat key elements of the plan.   Orders Placed This Encounter  Procedures  . CBC  . Comprehensive metabolic panel  . Lipid panel     Requested Prescriptions    No prescriptions requested or ordered in this encounter    Return in about 4 months (around 02/06/2021).  Karle Plumber, MD, FACP

## 2020-10-09 ENCOUNTER — Other Ambulatory Visit: Payer: Self-pay | Admitting: Internal Medicine

## 2020-10-09 DIAGNOSIS — E871 Hypo-osmolality and hyponatremia: Secondary | ICD-10-CM

## 2020-10-09 LAB — CYTOLOGY - PAP
Adequacy: ABSENT
Comment: NEGATIVE
Diagnosis: NEGATIVE
High risk HPV: NEGATIVE

## 2020-10-09 LAB — COMPREHENSIVE METABOLIC PANEL
ALT: 17 IU/L (ref 0–32)
AST: 17 IU/L (ref 0–40)
Albumin/Globulin Ratio: 1.7 (ref 1.2–2.2)
Albumin: 4.4 g/dL (ref 3.8–4.8)
Alkaline Phosphatase: 75 IU/L (ref 44–121)
BUN/Creatinine Ratio: 20 (ref 12–28)
BUN: 15 mg/dL (ref 8–27)
Bilirubin Total: <0.2 mg/dL (ref 0.0–1.2)
CO2: 21 mmol/L (ref 20–29)
Calcium: 10.4 mg/dL — ABNORMAL HIGH (ref 8.7–10.3)
Chloride: 93 mmol/L — ABNORMAL LOW (ref 96–106)
Creatinine, Ser: 0.76 mg/dL (ref 0.57–1.00)
Globulin, Total: 2.6 g/dL (ref 1.5–4.5)
Glucose: 81 mg/dL (ref 65–99)
Potassium: 3.5 mmol/L (ref 3.5–5.2)
Sodium: 132 mmol/L — ABNORMAL LOW (ref 134–144)
Total Protein: 7 g/dL (ref 6.0–8.5)
eGFR: 88 mL/min/{1.73_m2} (ref >59)

## 2020-10-09 LAB — LIPID PANEL
Chol/HDL Ratio: 4.1 ratio (ref 0.0–4.4)
Cholesterol, Total: 227 mg/dL — ABNORMAL HIGH (ref 100–199)
HDL: 55 mg/dL (ref >39)
LDL Chol Calc (NIH): 156 mg/dL — ABNORMAL HIGH (ref 0–99)
Triglycerides: 92 mg/dL (ref 0–149)
VLDL Cholesterol Cal: 16 mg/dL (ref 5–40)

## 2020-10-09 LAB — CBC
Hematocrit: 38.7 % (ref 34.0–46.6)
Hemoglobin: 12 g/dL (ref 11.1–15.9)
MCH: 25.7 pg — ABNORMAL LOW (ref 26.6–33.0)
MCHC: 31 g/dL — ABNORMAL LOW (ref 31.5–35.7)
MCV: 83 fL (ref 79–97)
Platelets: 366 10*3/uL (ref 150–450)
RBC: 4.67 x10E6/uL (ref 3.77–5.28)
RDW: 14.6 % (ref 11.7–15.4)
WBC: 6.8 10*3/uL (ref 3.4–10.8)

## 2020-10-09 MED ORDER — FLUCONAZOLE 150 MG PO TABS: 150.0000 mg | ORAL_TABLET | Freq: Once | ORAL | 0 refills | Status: AC

## 2020-10-09 NOTE — Progress Notes (Signed)
Let patient know that her blood cell counts including red blood cell, white blood cell and platelet counts are normal.  Kidney and liver function tests are normal.  Sodium level slightly low.  Calcium level slightly elevated.  Both of these are likely due to the hydrochlorothiazide that is contained within the lisinopril/HCTZ blood pressure pill.  I would like for her to return to the lab in about 2 to 4 weeks to have the electrolyte levels (I.e. sodium and calcium) rechecked.  If they remain stable, then we can observe.  But if the sodium level is worse on repeat, we may need to change the lisinopril/hydrochlorothiazide to lisinopril with a different medication added on.  LDL cholesterol is 156 with goal being less than 100.  High cholesterol increases her risk for heart attack and strokes.  Healthy eating habits and regular exercise will help to lower cholesterol.  I also recommend starting a medication to help lower the cholesterol call atorvastatin (Lipitor).  Let me know if she is willing to take the medication.   The rest of this is for my information. The 10-year ASCVD risk score Mikey Bussing DC Brooke Bonito., et al., 2013) is: 12.6%   Values used to calculate the score:     Age: 65 years     Sex: Female     Is Non-Hispanic African American: Yes     Diabetic: No     Tobacco smoker: Yes     Systolic Blood Pressure: 625 mmHg     Is BP treated: Yes     HDL Cholesterol: 55 mg/dL     Total Cholesterol: 227 mg/dL

## 2020-10-10 ENCOUNTER — Telehealth: Payer: Self-pay

## 2020-10-10 NOTE — Telephone Encounter (Signed)
Contacted pt to go over pap and lab results pt didn't answer and was unable to lvm due to vm being full

## 2020-12-19 ENCOUNTER — Other Ambulatory Visit: Payer: Self-pay | Admitting: Internal Medicine

## 2020-12-19 DIAGNOSIS — I10 Essential (primary) hypertension: Secondary | ICD-10-CM

## 2020-12-31 ENCOUNTER — Other Ambulatory Visit: Payer: Self-pay | Admitting: Internal Medicine

## 2020-12-31 DIAGNOSIS — I10 Essential (primary) hypertension: Secondary | ICD-10-CM

## 2021-01-23 ENCOUNTER — Other Ambulatory Visit: Payer: Self-pay | Admitting: Internal Medicine

## 2021-01-23 DIAGNOSIS — I10 Essential (primary) hypertension: Secondary | ICD-10-CM

## 2021-02-06 ENCOUNTER — Ambulatory Visit: Payer: BC Managed Care – PPO | Admitting: Internal Medicine

## 2021-02-06 DIAGNOSIS — U071 COVID-19: Secondary | ICD-10-CM | POA: Diagnosis not present

## 2021-02-06 DIAGNOSIS — Z20822 Contact with and (suspected) exposure to covid-19: Secondary | ICD-10-CM | POA: Diagnosis not present

## 2021-02-13 DIAGNOSIS — Z03818 Encounter for observation for suspected exposure to other biological agents ruled out: Secondary | ICD-10-CM | POA: Diagnosis not present

## 2021-02-13 DIAGNOSIS — Z20822 Contact with and (suspected) exposure to covid-19: Secondary | ICD-10-CM | POA: Diagnosis not present

## 2021-02-28 ENCOUNTER — Other Ambulatory Visit: Payer: Self-pay | Admitting: Internal Medicine

## 2021-02-28 DIAGNOSIS — I10 Essential (primary) hypertension: Secondary | ICD-10-CM

## 2021-04-03 ENCOUNTER — Other Ambulatory Visit: Payer: Self-pay | Admitting: Internal Medicine

## 2021-04-03 ENCOUNTER — Ambulatory Visit: Payer: BC Managed Care – PPO | Attending: Internal Medicine | Admitting: Internal Medicine

## 2021-04-03 ENCOUNTER — Other Ambulatory Visit: Payer: Self-pay

## 2021-04-03 ENCOUNTER — Encounter: Payer: Self-pay | Admitting: Internal Medicine

## 2021-04-03 VITALS — BP 120/74 | HR 92 | Ht <= 58 in | Wt 131.6 lb

## 2021-04-03 DIAGNOSIS — I10 Essential (primary) hypertension: Secondary | ICD-10-CM

## 2021-04-03 DIAGNOSIS — E782 Mixed hyperlipidemia: Secondary | ICD-10-CM

## 2021-04-03 DIAGNOSIS — F17210 Nicotine dependence, cigarettes, uncomplicated: Secondary | ICD-10-CM

## 2021-04-03 DIAGNOSIS — Z2821 Immunization not carried out because of patient refusal: Secondary | ICD-10-CM | POA: Diagnosis not present

## 2021-04-03 DIAGNOSIS — F17200 Nicotine dependence, unspecified, uncomplicated: Secondary | ICD-10-CM

## 2021-04-03 MED ORDER — LISINOPRIL-HYDROCHLOROTHIAZIDE 20-25 MG PO TABS: 1.0000 | ORAL_TABLET | Freq: Every day | ORAL | 5 refills | Status: DC

## 2021-04-03 MED ORDER — AMLODIPINE BESYLATE 5 MG PO TABS: 5.0000 mg | ORAL_TABLET | Freq: Every day | ORAL | 2 refills | Status: DC

## 2021-04-03 NOTE — Patient Instructions (Signed)

## 2021-04-03 NOTE — Progress Notes (Signed)
Patient ID: Martha Manning, female    DOB: 06-14-1956  MRN: [id number]  CC: Hypertension   Subjective: Martha Manning is a 65 y.o. female who presents for chronic ds management Her concerns today include: history of HTN, tobacco dependence, obesity, OA of right hip status post THA, insomnia  Cholesterol:  I went over lab results with her from last visit.  CMA and RN had attempted to reach her several times with no answer and her mailbox was full.  LDL cholesterol was 156 increased from 2 years ago when it was 117.  Total cholesterol was 227.  ASCVD score was 12.5%.  Patient reports her cholesterol was elevated because she had been eating a lot of butter, meat and cheese  HYPERTENSION Currently taking: see medication list.  On Lisinopril/HCTZ and amlodipine Med Adherence: [x]  Yes    []  No Medication side effects: []  Yes    [x]  No Adherence with salt restriction: [x]  Yes    []  No Home Monitoring?: [x]  Yes    []  No Monitoring Frequency: Checks BP 2x/mth. BP slightly elev today on initial check.  She feels elev today due to stress from work and just came from work Home BP results range: [phone]/70s SOB? []  Yes    [x]  No Chest Pain?: []  Yes    [x]  No Leg swelling?: []  Yes    [x]  No Headaches?: []  Yes    [x]  No Dizziness? []  Yes    [x]  No Comments: Sodium level was slightly low at 132 and calcium level was 10.4 on last BMP.  Likely due to HCTZ.  Advised patient that we will plan to recheck level today.  Reports having had COVID infection 01/2021.  Reports symptoms were not too bad. Has not had COVID booster but plans to get it  Tob dep: still smoking and not ready to quit.  May quit next yr  HM: declines flu shot.  Plans to get COVID booster. Declines shingle shot for now. Patient Active Problem List   Diagnosis Date Noted   Influenza vaccine refused 06/07/2020   Overweight (BMI 25.0-29.9) 06/07/2020   Obesity (BMI 30-39.9) 05/04/2019   Mixed hyperlipidemia 05/04/2019   Sinus  congestion 05/04/2019   Gastroesophageal reflux disease without esophagitis 07/26/2018   Diabetes mellitus screening 12/21/2016   Status post total replacement of right hip 10/08/2015   Ganglion of left wrist 05/16/2015   Essential hypertension 03/14/2014   Osteoarthritis of right hip 03/14/2014   Smoking 03/14/2014     Current Outpatient Medications on File Prior to Visit  Medication Sig Dispense Refill   amLODipine (NORVASC) 5 MG tablet Take 1 tablet by mouth once daily 90 tablet 0   aspirin 81 MG chewable tablet Chew 81 mg by mouth daily.     fluticasone (FLONASE) 50 MCG/ACT nasal spray Place 1 spray into both nostrils daily as needed for allergies or rhinitis. 16 g 3   hydrOXYzine (ATARAX/VISTARIL) 25 MG tablet TAKE 1 TO 2 TABLETS BY MOUTH EVERY DAY AT BEDTIME AS NEEDED 180 tablet 3   lisinopril-hydrochlorothiazide (ZESTORETIC) 20-25 MG tablet Take 1 tablet by mouth once daily 30 tablet 1   loratadine (CLARITIN) 10 MG tablet Take 10 mg by mouth daily as needed for allergies or itching.     meloxicam (MOBIC) 15 MG tablet Take 1 tablet (15 mg total) by mouth daily. 30 tablet 2   Multiple Vitamin (MULTIVITAMIN WITH MINERALS) TABS tablet Take 1 tablet by mouth daily as needed (for supplementation). Reported on 12/02/2015  omeprazole (PRILOSEC) 20 MG capsule Take 1 capsule by mouth once daily 30 capsule 0   No current facility-administered medications on file prior to visit.    Allergies  Allergen Reactions   Carvedilol     Makes her feel winded when she walks upstairs.   Clindamycin/Lincomycin     Pt states she took this antibiotic once and broke out in a rash.    Codeine    Norvasc [Amlodipine Besylate] Swelling    Social History   Socioeconomic History   Marital status: Divorced    Spouse name: Not on file   Number of children: Not on file   Years of education: Not on file   Highest education level: Not on file  Occupational History   Not on file  Tobacco Use    Smoking status: Every Day    Packs/day: 0.50    Years: 40.00    Pack years: 20.00    Types: Cigarettes   Smokeless tobacco: Never   Tobacco comments:    7 cigs a day   Substance and Sexual Activity   Alcohol use: Yes    Alcohol/week: 7.0 standard drinks    Types: 7 Glasses of wine per week    Comment: 7 glasses of wine per week, one per day   Drug use: No   Sexual activity: Not Currently  Other Topics Concern   Not on file  Social History Narrative   Not on file   Social Determinants of Health   Financial Resource Strain: Not on file  Food Insecurity: Not on file  Transportation Needs: Not on file  Physical Activity: Not on file  Stress: Not on file  Social Connections: Not on file  Intimate Partner Violence: Not on file    Family History  Problem Relation Age of Onset   Hypertension Mother    Stroke Mother    Hypertension Sister    Colon cancer Neg Hx    Esophageal cancer Neg Hx    Rectal cancer Neg Hx    Stomach cancer Neg Hx    Pancreatic cancer Neg Hx    Prostate cancer Neg Hx     Past Surgical History:  Procedure Laterality Date   DENTAL SURGERY     TOTAL HIP ARTHROPLASTY Right 10/08/2015   Procedure: RIGHT TOTAL HIP ARTHROPLASTY ANTERIOR APPROACH;  Surgeon: Mcarthur Rossetti, MD;  Location: [facility];  Service: Orthopedics;  Laterality: Right;   VAGINAL DELIVERY     41 yrs ago    ROS: Review of Systems Negative except as stated above  PHYSICAL EXAM: BP 122/83   Pulse 92   Ht 4\' 10"  (1.473 m)   Wt 131 lb 9.6 oz (59.7 kg)   SpO2 99%   BMI 27.50 kg/m   Physical Exam  General appearance - alert, well appearing, and in no distress Mental status - normal mood, behavior, speech, dress, motor activity, and thought processes Mouth - mucous membranes moist, pharynx normal without lesions Neck - supple, no significant adenopathy Chest - clear to auscultation, no wheezes, rales or rhonchi, symmetric air entry Heart - normal rate, regular rhythm,  normal S1, S2, no murmurs, rubs, clicks or gallops Extremities - peripheral pulses normal, no pedal edema, no clubbing or cyanosis   CMP Latest Ref Rng & Units 10/07/2020 06/07/2020 03/21/2019  Glucose 65 - 99 mg/dL 81 WILL FOLLOW 104(H)  BUN 8 - 27 mg/dL 15 WILL FOLLOW 13  Creatinine 0.57 - 1.00 mg/dL 0.76 WILL FOLLOW 0.86  Sodium  134 - 144 mmol/L 132(L) WILL FOLLOW 137  Potassium 3.5 - 5.2 mmol/L 3.5 WILL FOLLOW 3.8  Chloride 96 - 106 mmol/L 93(L) WILL FOLLOW 103  CO2 20 - 29 mmol/L 21 WILL FOLLOW 24  Calcium 8.7 - 10.3 mg/dL 10.4(H) WILL FOLLOW 9.8  Total Protein 6.0 - 8.5 g/dL 7.0 WILL FOLLOW 6.7  Total Bilirubin 0.0 - 1.2 mg/dL <0.2 WILL FOLLOW <0.2  Alkaline Phos 44 - 121 IU/L 75 WILL FOLLOW 83  AST 0 - 40 IU/L 17 WILL FOLLOW 12  ALT 0 - 32 IU/L 17 WILL FOLLOW 12   Lipid Panel     Component Value Date/Time   CHOL 227 (H) 10/07/2020 1615   TRIG 92 10/07/2020 1615   HDL 55 10/07/2020 1615   CHOLHDL 4.1 10/07/2020 1615   CHOLHDL 3.1 03/14/2014 1102   VLDL 15 03/14/2014 1102   LDLCALC 156 (H) 10/07/2020 1615    CBC    Component Value Date/Time   WBC 6.8 10/07/2020 1615   WBC 9.9 10/10/2015 0520   RBC 4.67 10/07/2020 1615   RBC 3.28 (L) 10/10/2015 0520   HGB 12.0 10/07/2020 1615   HCT 38.7 10/07/2020 1615   PLT 366 10/07/2020 1615   MCV 83 10/07/2020 1615   MCH 25.7 (L) 10/07/2020 1615   MCH 25.0 (L) 10/10/2015 0520   MCHC 31.0 (L) 10/07/2020 1615   MCHC 31.5 10/10/2015 0520   RDW 14.6 10/07/2020 1615   LYMPHSABS CANCELED 06/07/2020 1623   MONOABS 0.9 03/14/2014 1102   EOSABS CANCELED 06/07/2020 1623   BASOSABS CANCELED 06/07/2020 1623    ASSESSMENT AND PLAN: 1. Essential hypertension At goal.  Continue amlodipine and lisinopril/HCTZ.  Recheck BMP today for sodium and calcium level. - Basic Metabolic Panel - amLODipine (NORVASC) 5 MG tablet; Take 1 tablet (5 mg total) by mouth daily.  Dispense: 90 tablet; Refill: 2 - lisinopril-hydrochlorothiazide (ZESTORETIC)  20-25 MG tablet; Take 1 tablet by mouth daily.  Dispense: 30 tablet; Refill: 5  2. Mixed hyperlipidemia Based on ASCVD score, I recommend starting statin therapy.  Patient declined.  She states that she will work on improving her eating habits and we can recheck on next visit  3. Tobacco dependence Advised to quit.  She is aware of health risks associated with smoking.  She is not ready to give a trial of quitting at this time.  4. Influenza vaccination declined Recommended.  Patient declined.  Advised that she get the COVID booster shot before the end of the year.  Patient states she will do so.    Patient was given the opportunity to ask questions.  Patient verbalized understanding of the plan and was able to repeat key elements of the plan.   No orders of the defined types were placed in this encounter.    Requested Prescriptions    No prescriptions requested or ordered in this encounter    No follow-ups on file.  Karle Plumber, MD, FACP

## 2021-04-04 ENCOUNTER — Telehealth: Payer: Self-pay | Admitting: Internal Medicine

## 2021-04-04 LAB — BASIC METABOLIC PANEL
BUN/Creatinine Ratio: 18 (ref 12–28)
BUN: 18 mg/dL (ref 8–27)
CO2: 22 mmol/L (ref 20–29)
Calcium: 11.2 mg/dL — ABNORMAL HIGH (ref 8.7–10.3)
Chloride: 100 mmol/L (ref 96–106)
Creatinine, Ser: 0.99 mg/dL (ref 0.57–1.00)
Glucose: 80 mg/dL (ref 70–99)
Potassium: 3.7 mmol/L (ref 3.5–5.2)
Sodium: 139 mmol/L (ref 134–144)
eGFR: 64 mL/min/{1.73_m2} (ref >59)

## 2021-04-04 NOTE — Telephone Encounter (Signed)
Pt returned office call. Pt asked that I send message to confirm received. Pt says that she will come in next week to have labs.

## 2021-04-04 NOTE — Telephone Encounter (Signed)
Requested Prescriptions  Pending Prescriptions Disp Refills  . amLODipine (NORVASC) 5 MG tablet [Pharmacy Med Name: amLODIPine Besylate 5 MG Oral Tablet] 90 tablet 0    Sig: Take 1 tablet by mouth once daily     Cardiovascular:  Calcium Channel Blockers Passed - 04/03/2021  2:09 PM      Passed - Last BP in normal range    BP Readings from Last 1 Encounters:  04/03/21 120/74         Passed - Valid encounter within last 6 months    Recent Outpatient Visits          Yesterday Essential hypertension   [place] Karle Plumber B, MD   5 months ago Pap smear for cervical cancer screening   [place], MD   10 months ago Essential hypertension   [place], Deborah B, MD   1 year ago Essential hypertension   [place], Deborah B, MD   1 year ago Essential hypertension   [place], MD      Future Appointments            In 5 months Wynetta Emery, Dalbert Batman, MD [place]

## 2021-04-04 NOTE — Telephone Encounter (Signed)
Phone call placed to patient this morning to go over lab results.  I received her voicemail.  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  She told me yesterday that I can leave a message with her results on her voicemail.  I left message informing her that her sodium level has normalized but calcium level came back even more elevated and we will need to evaluate this further.  Advised that if she is taking vitamin D with calcium I would hold off on doing so for now.  Advised that our lab is open between 8:30 a.m-4:30 p.m but close for lunch between 12:30 PM to 1:30 PM.  Advised that she returns to the lab at her earliest convenience to have the additional blood test done.

## 2021-04-10 ENCOUNTER — Ambulatory Visit: Payer: BC Managed Care – PPO | Attending: Internal Medicine

## 2021-04-10 ENCOUNTER — Other Ambulatory Visit: Payer: Self-pay

## 2021-04-16 ENCOUNTER — Telehealth: Payer: Self-pay | Admitting: Internal Medicine

## 2021-04-16 NOTE — Telephone Encounter (Signed)
Phone call placed to patient this morning to go over lab results in regards to the work-up of the hypercalcemia. I left a voicemail message informing her that so far the lab studies that we did to work-up the hypercalcemia including parathyroid hormone level, thyroid level, vitamin D level are coming back normal.  I await the vitamin A level.  I informed her that I suspect the elevation in the calcium is likely related to the hydrochlorothiazide component that is in the medication lisinopril/hydrochlorothiazide.  We can either change the medication removing the hydrochlorothiazide component for about a month and then rechecking the calcium level to see whether it normalizes.  This will let us know that it is the hydrochlorothiazide that is causing the mild elevation.  However if the calcium level does not normalize with this then we would need to do further work-up.  I recommend that she call and schedule a telephone visit with me so that we can discuss further.

## 2021-04-21 ENCOUNTER — Other Ambulatory Visit: Payer: Self-pay | Admitting: Nurse Practitioner

## 2021-04-21 LAB — PTH, INTACT AND CALCIUM
Calcium: 10.8 mg/dL — ABNORMAL HIGH (ref 8.7–10.3)
PTH: 38 pg/mL (ref 15–65)

## 2021-04-21 LAB — PE AND FLC, SERUM
A/G Ratio: 1.3 (ref 0.7–1.7)
Albumin ELP: 4 g/dL (ref 2.9–4.4)
Alpha 1: 0.2 g/dL (ref 0.0–0.4)
Alpha 2: 0.9 g/dL (ref 0.4–1.0)
Beta: 1.1 g/dL (ref 0.7–1.3)
Gamma Globulin: 1 g/dL (ref 0.4–1.8)
Globulin, Total: 3.2 g/dL (ref 2.2–3.9)
Ig Kappa Free Light Chain: 18.2 mg/L (ref 3.3–19.4)
Ig Lambda Free Light Chain: 11.5 mg/L (ref 5.7–26.3)
KAPPA/LAMBDA RATIO: 1.58 (ref 0.26–1.65)
Total Protein: 7.2 g/dL (ref 6.0–8.5)

## 2021-04-21 LAB — VITAMIN A: Vitamin A: 67.6 ug/dL (ref 22.0–69.5)

## 2021-04-21 LAB — TSH: TSH: 0.515 u[IU]/mL (ref 0.450–4.500)

## 2021-04-21 LAB — VITAMIN D 25 HYDROXY (VIT D DEFICIENCY, FRACTURES): Vit D, 25-Hydroxy: 34.7 ng/mL (ref 30.0–100.0)

## 2021-04-21 MED ORDER — LISINOPRIL 20 MG PO TABS: 20.0000 mg | ORAL_TABLET | Freq: Every day | ORAL | 3 refills | Status: DC

## 2021-04-22 ENCOUNTER — Encounter: Payer: Self-pay | Admitting: Nurse Practitioner

## 2021-04-22 ENCOUNTER — Other Ambulatory Visit: Payer: Self-pay

## 2021-04-22 ENCOUNTER — Ambulatory Visit: Payer: BC Managed Care – PPO | Attending: Nurse Practitioner | Admitting: Nurse Practitioner

## 2021-04-22 DIAGNOSIS — I10 Essential (primary) hypertension: Secondary | ICD-10-CM | POA: Diagnosis not present

## 2021-04-22 NOTE — Progress Notes (Addendum)
Virtual Visit via Telephone Note Due to national recommendations of social distancing due to [place] 19, telehealth visit is felt to be most appropriate for this patient at this time.  I discussed the limitations, risks, security and privacy concerns of performing an evaluation and management service by telephone and the availability of in person appointments. I also discussed with the patient that there may be a patient responsible charge related to this service. The patient expressed understanding and agreed to proceed.    I connected with Martha Manning on 04/22/21  at   3:10 PM EDT  EDT by telephone and verified that I am speaking with the correct person using two identifiers.  Location of Patient: Private Residence   Location of Provider: [place] [facility] [place] participating in Telemedicine visit: Geryl Rankins FNP-BC Martha Manning    History of Present Illness: Telemedicine visit for: HYPERCALCEMIA   Currently with elevated calcium. I did discuss changes that were recommended by her PCP. At this time we will stop Lisinopril-HCTZ and start lisinopril 20 mg daily. She will continue on amlodipine 5 mg (has BLE edema with 10 mg of amlodipine). She declines starting carvedilol today and does not want to take a medication which requires twice a day dosing. She will monitor her blood pressures at home and if elevated consistently >130/80 she will reach out to PCP.  BP Readings from Last 3 Encounters:  04/03/21 120/74  10/07/20 112/76  06/07/20 120/82       Past Medical History:  Diagnosis Date   Allergy    Arthritis    "right hip" (10/08/2015)   GERD (gastroesophageal reflux disease)    depends on diet    Hypertension     Past Surgical History:  Procedure Laterality Date   DENTAL SURGERY     TOTAL HIP ARTHROPLASTY Right 10/08/2015   Procedure: RIGHT TOTAL HIP ARTHROPLASTY ANTERIOR APPROACH;  Surgeon: Mcarthur Rossetti, MD;  Location: [facility];  Service: Orthopedics;  Laterality: Right;   VAGINAL DELIVERY     75 yrs ago    Family History  Problem Relation Age of Onset   Hypertension Mother    Stroke Mother    Hypertension Sister    Colon cancer Neg Hx    Esophageal cancer Neg Hx    Rectal cancer Neg Hx    Stomach cancer Neg Hx    Pancreatic cancer Neg Hx    Prostate cancer Neg Hx     Social History   Socioeconomic History   Marital status: Divorced    Spouse name: Not on file   Number of children: Not on file   Years of education: Not on file   Highest education level: Not on file  Occupational History   Not on file  Tobacco Use   Smoking status: Every Day    Packs/day: 0.50    Years: 40.00    Pack years: 20.00    Types: Cigarettes   Smokeless tobacco: Never   Tobacco comments:    7 cigs a day   Substance and Sexual Activity   Alcohol use: Yes    Alcohol/week: 7.0 standard drinks    Types: 7 Glasses of wine per week    Comment: 7 glasses of wine per week, one per day   Drug use: No   Sexual activity: Not Currently  Other Topics Concern   Not on file  Social History Narrative   Not on file   Social Determinants  of Health   Financial Resource Strain: Not on file  Food Insecurity: Not on file  Transportation Needs: Not on file  Physical Activity: Not on file  Stress: Not on file  Social Connections: Not on file     Observations/Objective: Awake, alert and oriented x 3   Review of Systems  Constitutional:  Negative for fever, malaise/fatigue and weight loss.  HENT: Negative.  Negative for nosebleeds.   Eyes: Negative.  Negative for blurred vision, double vision and photophobia.  Respiratory: Negative.  Negative for cough and shortness of breath.   Cardiovascular: Negative.  Negative for chest pain, palpitations and leg swelling.  Gastrointestinal: Negative.  Negative for heartburn, nausea and vomiting.  Musculoskeletal: Negative.  Negative for myalgias.  Neurological: Negative.  Negative  for dizziness, focal weakness, seizures and headaches.  Psychiatric/Behavioral: Negative.  Negative for suicidal ideas.    Assessment and Plan: Diagnoses and all orders for this visit:  Essential hypertension Continue lisinopril 20mg  daily and amlodipine 5 mg daily. Monitor blood pressure readings daily and report elevated BP as instructed  Follow Up Instructions Return if symptoms worsen or fail to improve.     I discussed the assessment and treatment plan with the patient. The patient was provided an opportunity to ask questions and all were answered. The patient agreed with the plan and demonstrated an understanding of the instructions.   The patient was advised to call back or seek an in-person evaluation if the symptoms worsen or if the condition fails to improve as anticipated.  I provided 10 minutes of non-face-to-face time during this encounter including median intraservice time, reviewing previous notes, labs, imaging, medications and explaining diagnosis and management.  Martha Pounds, FNP-BC

## 2021-05-07 LAB — SPECIMEN STATUS REPORT

## 2021-05-07 LAB — CALCIUM: Calcium: 10.7 mg/dL — ABNORMAL HIGH (ref 8.7–10.3)

## 2021-05-20 ENCOUNTER — Encounter: Payer: Self-pay | Admitting: Pharmacist

## 2021-05-20 ENCOUNTER — Other Ambulatory Visit: Payer: Self-pay

## 2021-05-20 ENCOUNTER — Ambulatory Visit: Payer: BC Managed Care – PPO | Attending: Internal Medicine | Admitting: Pharmacist

## 2021-05-20 VITALS — BP 125/88

## 2021-05-20 DIAGNOSIS — I10 Essential (primary) hypertension: Secondary | ICD-10-CM | POA: Diagnosis not present

## 2021-05-20 NOTE — Progress Notes (Signed)
   S:    Patient arrives in good spirits. Presents to the clinic for hypertension evaluation, counseling, and management. Patient was referred and last seen by Primary Care Provider Geryl Rankins on 04/22/2021. Patient took alkaseltzer +, Nyquil, and Coricidin over the last couple of weeks, but denies taking any this morning.  Medication adherence reported.  Current BP Medications include:   -Lisinopril 20 mg daily -Amlodipine 5 mg daily  Antihypertensives tried in the past include:  -Amlodipine   Dietary habits include: Patient said "I like salt, but I have discipline." Reports sprinkling a little extra today since she had a thanksgiving celebration at work. Breakfast is generally a fried egg, snacks are cheese and crackers, lunch chips, [place] sandwich. Patient was educated on the effect this diet likely has on hypertension and advised low salt diet with proportions consistent with the plate method.  Exercise habits include:Patient does Jazzercise 3 times per week for 45 minutes per session.  Family / Social history:  -Smoker -Mother: HTN and stroke  ASCVD risk factors include:HTN  O:  Vitals:   05/20/21 1654  BP: 125/88    Home BP readings:  -End of October BP 123/77 -About 1 week after HCTZ discontinuation BP 134/78 -05/08/21 BP 128/84 -05/10/21 BP 124/82 -05/12/21 BP 123/77 -05/12/21 BP 125/78 -05/15/21 BP 128/72 -05/17/21 BP 131/84  BP readings in clinic today: 125/88 122/85  Last 3 Office BP readings: BP Readings from Last 3 Encounters:  05/20/21 125/88  04/03/21 120/74  10/07/20 112/76    BMET    Component Value Date/Time   NA 139 04/03/2021 1510   K 3.7 04/03/2021 1510   CL 100 04/03/2021 1510   CO2 22 04/03/2021 1510   GLUCOSE 80 04/03/2021 1510   GLUCOSE 88 12/02/2015 1445   BUN 18 04/03/2021 1510   CREATININE 0.99 04/03/2021 1510   CREATININE 0.61 12/02/2015 1445   CALCIUM 10.8 (H) 04/10/2021 0907   CALCIUM 10.7 (H) 04/10/2021 0907    GFRNONAA WILL FOLLOW 06/07/2020 1623   GFRNONAA >89 12/02/2015 1445   GFRAA WILL FOLLOW 06/07/2020 1623   GFRAA >89 12/02/2015 1445    Renal function: CrCl cannot be calculated (Patient's most recent lab result is older than the maximum 21 days allowed.).  Clinical ASCVD: No  The 10-year ASCVD risk score (Arnett DK, et al., 2019) is: 17.4%   Values used to calculate the score:     Age: 65 years     Sex: Female     Is Non-Hispanic African American: Yes     Diabetic: No     Tobacco smoker: Yes     Systolic Blood Pressure: 665 mmHg     Is BP treated: Yes     HDL Cholesterol: 55 mg/dL     Total Cholesterol: 227 mg/dL  A/P: Hypertension longstanding currently nearly at goal on current medications. BP Goal = < 130/80 mmHg. Medication adherence reported.  -Continue Lisinopril 20 mg daily -Continue Amlodipine 5 mg daily -F/u labs ordered - CMP to monitor Ca after stopping HCTZ -Counseled on lifestyle modifications for blood pressure control including reduced dietary sodium, increased exercise, adequate sleep.  Results reviewed and written information provided. Total time in face-to-face counseling 30 minutes.  F/U Clinic Visit with pharmacy in one month.   Thank you for allowing pharmacy to participate in this patient's care.  Reatha Harps, PharmD PGY1 Pharmacy Resident 05/20/2021 4:54 PM

## 2021-05-21 ENCOUNTER — Telehealth: Payer: Self-pay

## 2021-05-21 LAB — CMP14+EGFR
ALT: 13 IU/L (ref 0–32)
AST: 15 IU/L (ref 0–40)
Albumin/Globulin Ratio: 2 (ref 1.2–2.2)
Albumin: 4.4 g/dL (ref 3.8–4.8)
Alkaline Phosphatase: 83 IU/L (ref 44–121)
BUN/Creatinine Ratio: 14 (ref 12–28)
BUN: 11 mg/dL (ref 8–27)
Bilirubin Total: <0.2 mg/dL (ref 0.0–1.2)
CO2: 23 mmol/L (ref 20–29)
Calcium: 10.3 mg/dL (ref 8.7–10.3)
Chloride: 104 mmol/L (ref 96–106)
Creatinine, Ser: 0.78 mg/dL (ref 0.57–1.00)
Globulin, Total: 2.2 g/dL (ref 1.5–4.5)
Glucose: 87 mg/dL (ref 70–99)
Potassium: 4.4 mmol/L (ref 3.5–5.2)
Sodium: 139 mmol/L (ref 134–144)
Total Protein: 6.6 g/dL (ref 6.0–8.5)
eGFR: 85 mL/min/{1.73_m2} (ref >59)

## 2021-05-21 NOTE — Progress Notes (Signed)
Let patient know that her calcium level has normalized.  Kidney and liver function tests good.

## 2021-05-21 NOTE — Telephone Encounter (Signed)
Contacted pt to go over lab results pt didn't answer lvm   Sent a CRM and forward labs to NT to give pt labs when they call back   

## 2021-05-27 ENCOUNTER — Other Ambulatory Visit: Payer: Self-pay | Admitting: Internal Medicine

## 2021-05-27 DIAGNOSIS — Z1231 Encounter for screening mammogram for malignant neoplasm of breast: Secondary | ICD-10-CM

## 2021-06-17 ENCOUNTER — Ambulatory Visit: Payer: BC Managed Care – PPO | Admitting: Pharmacist

## 2021-06-19 ENCOUNTER — Ambulatory Visit: Payer: BC Managed Care – PPO | Admitting: Pharmacist

## 2021-07-01 ENCOUNTER — Ambulatory Visit
Admission: RE | Admit: 2021-07-01 | Discharge: 2021-07-01 | Disposition: A | Discharge status: Home / Self Care | Payer: BC Managed Care – PPO | Source: Ambulatory Visit | Attending: Internal Medicine | Admitting: Internal Medicine

## 2021-07-01 DIAGNOSIS — Z1231 Encounter for screening mammogram for malignant neoplasm of breast: Secondary | ICD-10-CM

## 2021-07-15 ENCOUNTER — Other Ambulatory Visit: Payer: Self-pay | Admitting: Internal Medicine

## 2021-07-15 DIAGNOSIS — M542 Cervicalgia: Secondary | ICD-10-CM

## 2021-07-15 NOTE — Telephone Encounter (Signed)
Requested Prescriptions  Pending Prescriptions Disp Refills   meloxicam (MOBIC) 15 MG tablet [Pharmacy Med Name: Meloxicam 15 MG Oral Tablet] 30 tablet 2    Sig: Take 1 tablet by mouth once daily     Analgesics:  COX2 Inhibitors Passed - 07/15/2021  2:53 PM      Passed - HGB in normal range and within 360 days    Hemoglobin  Date Value Ref Range Status  10/07/2020 12.0 11.1 - 15.9 g/dL Final         Passed - Cr in normal range and within 360 days    Creat  Date Value Ref Range Status  12/02/2015 0.61 0.50 - 1.05 mg/dL Final   Creatinine, Ser  Date Value Ref Range Status  05/20/2021 0.78 0.57 - 1.00 mg/dL Final         Passed - Patient is not pregnant      Passed - Valid encounter within last 12 months    Recent Outpatient Visits          1 month ago Essential hypertension   [place], RPH-CPP   2 months ago Essential hypertension   [place], Vernia Buff, NP   3 months ago Essential hypertension   [place], MD   9 months ago Pap smear for cervical cancer screening   [place], Deborah B, MD   1 year ago Essential hypertension   [place], MD      Future Appointments            In 2 months Wynetta Emery Dalbert Batman, MD [place]

## 2021-09-16 ENCOUNTER — Ambulatory Visit: Payer: BC Managed Care – PPO | Attending: Internal Medicine | Admitting: Internal Medicine

## 2021-09-16 ENCOUNTER — Other Ambulatory Visit: Payer: Self-pay

## 2021-09-16 ENCOUNTER — Encounter: Payer: Self-pay | Admitting: Internal Medicine

## 2021-09-16 VITALS — BP 129/77 | HR 64 | Resp 16 | Wt 128.0 lb

## 2021-09-16 DIAGNOSIS — F17200 Nicotine dependence, unspecified, uncomplicated: Secondary | ICD-10-CM | POA: Diagnosis not present

## 2021-09-16 DIAGNOSIS — F5104 Psychophysiologic insomnia: Secondary | ICD-10-CM | POA: Diagnosis not present

## 2021-09-16 DIAGNOSIS — F4321 Adjustment disorder with depressed mood: Secondary | ICD-10-CM | POA: Diagnosis not present

## 2021-09-16 DIAGNOSIS — I10 Essential (primary) hypertension: Secondary | ICD-10-CM

## 2021-09-16 DIAGNOSIS — F4320 Adjustment disorder, unspecified: Secondary | ICD-10-CM

## 2021-09-16 MED ORDER — ZOLPIDEM TARTRATE 5 MG PO TABS: 5.0000 mg | ORAL_TABLET | Freq: Every evening | ORAL | 1 refills | Status: DC | PRN

## 2021-09-16 NOTE — Progress Notes (Signed)
? ? ?Patient ID: Martha Manning, female    DOB: 06/01/1956  MRN: [id number] ? ?CC: Hypertension ? ? ?Subjective: ?Martha Manning is a 66 y.o. female who presents for chronic ds management ?Her concerns today include:  ?history of HTN, HL (declined statin) tob dependence, obesity, OA of right hip status post THA, insomnia ? ?Grieving over the death of her mom who passed the end of January.  Mom was in a NH for 4 yrs. she has good support from her sister. ? ?Tob dep:  too stress to quit right now ? ?HTN:  tolerating Lisinopril and Norvasc.  We took out the HCTZ component due to low NA and elev Ca.  Both normalized. ?Checked BP once in the past month and it was good.  She continues to limit salt in the foods. ? ?She is requesting to try different medication for insomnia.  She has chronic insomnia.  We have tried her with trazodone in the past which did not help much.  We also tried with hydroxyzine which she states helped a little but she still has problems staying asleep.  She usually gets in bed around 830 and watches TV until about 9:30 PM.  She sleeps with the TV on.  Does not drink any caffeinated beverages or excessive alcohol at bedtime. ?Patient Active Problem List  ? Diagnosis Date Noted  ? Influenza vaccine refused 06/07/2020  ? Overweight (BMI 25.0-29.9) 06/07/2020  ? Obesity (BMI 30-39.9) 05/04/2019  ? Mixed hyperlipidemia 05/04/2019  ? Sinus congestion 05/04/2019  ? Gastroesophageal reflux disease without esophagitis 07/26/2018  ? Diabetes mellitus screening 12/21/2016  ? Status post total replacement of right hip 10/08/2015  ? Ganglion of left wrist 05/16/2015  ? Essential hypertension 03/14/2014  ? Osteoarthritis of right hip 03/14/2014  ? Smoking 03/14/2014  ?  ? ?Current Outpatient Medications on File Prior to Visit  ?Medication Sig Dispense Refill  ? amLODipine (NORVASC) 5 MG tablet Take 1 tablet (5 mg total) by mouth daily. 90 tablet 2  ? aspirin 81 MG chewable tablet Chew 81 mg by mouth daily.     ? fluticasone (FLONASE) 50 MCG/ACT nasal spray Place 1 spray into both nostrils daily as needed for allergies or rhinitis. 16 g 3  ? hydrOXYzine (ATARAX/VISTARIL) 25 MG tablet TAKE 1 TO 2 TABLETS BY MOUTH EVERY DAY AT BEDTIME AS NEEDED 180 tablet 3  ? lisinopril (ZESTRIL) 20 MG tablet Take 1 tablet (20 mg total) by mouth daily. 90 tablet 3  ? loratadine (CLARITIN) 10 MG tablet Take 10 mg by mouth daily as needed for allergies or itching.    ? meloxicam (MOBIC) 15 MG tablet Take 1 tablet by mouth once daily 30 tablet 2  ? Multiple Vitamin (MULTIVITAMIN WITH MINERALS) TABS tablet Take 1 tablet by mouth daily as needed (for supplementation). Reported on 12/02/2015    ? omeprazole (PRILOSEC) 20 MG capsule Take 1 capsule by mouth once daily 30 capsule 0  ? ?No current facility-administered medications on file prior to visit.  ? ? ?Allergies  ?Allergen Reactions  ? Carvedilol   ?  Makes her feel winded when she walks upstairs.  ? Clindamycin/Lincomycin   ?  Pt states she took this antibiotic once and broke out in a rash.   ? Codeine   ? Norvasc [Amlodipine Besylate] Swelling  ? ? ?Social History  ? ?Socioeconomic History  ? Marital status: Divorced  ?  Spouse name: Not on file  ? Number of children: Not on file  ?  Years of education: Not on file  ? Highest education level: Not on file  ?Occupational History  ? Not on file  ?Tobacco Use  ? Smoking status: Every Day  ?  Packs/day: 0.50  ?  Years: 40.00  ?  Pack years: 20.00  ?  Types: Cigarettes  ? Smokeless tobacco: Never  ? Tobacco comments:  ?  7 cigs a day   ?Substance and Sexual Activity  ? Alcohol use: Yes  ?  Alcohol/week: 7.0 standard drinks  ?  Types: 7 Glasses of wine per week  ?  Comment: 7 glasses of wine per week, one per day  ? Drug use: No  ? Sexual activity: Not Currently  ?Other Topics Concern  ? Not on file  ?Social History Narrative  ? Not on file  ? ?Social Determinants of Health  ? ?Financial Resource Strain: Not on file  ?Food Insecurity: Not on file   ?Transportation Needs: Not on file  ?Physical Activity: Not on file  ?Stress: Not on file  ?Social Connections: Not on file  ?Intimate Partner Violence: Not on file  ? ? ?Family History  ?Problem Relation Age of Onset  ? Hypertension Mother   ? Stroke Mother   ? Hypertension Sister   ? Colon cancer Neg Hx   ? Esophageal cancer Neg Hx   ? Rectal cancer Neg Hx   ? Stomach cancer Neg Hx   ? Pancreatic cancer Neg Hx   ? Prostate cancer Neg Hx   ? ? ?Past Surgical History:  ?Procedure Laterality Date  ? DENTAL SURGERY    ? TOTAL HIP ARTHROPLASTY Right 10/08/2015  ? Procedure: RIGHT TOTAL HIP ARTHROPLASTY ANTERIOR APPROACH;  Surgeon: Mcarthur Rossetti, MD;  Location: [facility];  Service: Orthopedics;  Laterality: Right;  ? VAGINAL DELIVERY    ? 41 yrs ago  ? ? ?ROS: ?Review of Systems ?Negative except as stated above ? ?PHYSICAL EXAM: ?BP 129/77   Pulse 64   Resp 16   Wt 128 lb (58.1 kg)   SpO2 100%   BMI 26.75 kg/m?   ?Wt Readings from Last 3 Encounters:  ?09/16/21 128 lb (58.1 kg)  ?04/03/21 131 lb 9.6 oz (59.7 kg)  ?10/07/20 137 lb (62.1 kg)  ? ? ?Physical Exam ? ?General appearance - alert, well appearing, and in no distress.  Patient tearful when talking about her mother who passed 2 months ago. ?Mental status - normal mood, behavior, speech, dress, motor activity, and thought processes ?Neck - supple, no significant adenopathy ?Chest - clear to auscultation, no wheezes, rales or rhonchi, symmetric air entry ?Heart - normal rate, regular rhythm, normal S1, S2, no murmurs, rubs, clicks or gallops ?Extremities - peripheral pulses normal, no pedal edema, no clubbing or cyanosis ? ?Depression screen [facility] 2/9 09/16/2021 04/03/2021 10/07/2020  ?Decreased Interest 1 0 0  ?Down, Depressed, Hopeless 2 0 0  ?PHQ - 2 Score 3 0 0  ?Altered sleeping 3 3 -  ?Tired, decreased energy 0 0 -  ?Change in appetite 0 0 -  ?Feeling bad or failure about yourself  0 0 -  ?Trouble concentrating 0 0 -  ?Moving slowly or fidgety/restless 0 0  -  ?Suicidal thoughts 0 0 -  ?PHQ-9 Score 6 3 -  ?Difficult doing work/chores - - -  ? ?GAD 7 : Generalized Anxiety Score 09/16/2021 04/03/2021 10/07/2020 06/07/2020  ?Nervous, Anxious, on Edge 2 0 0 0  ?Control/stop worrying 1 0 0 0  ?Worry too much -  different things 2 0 0 0  ?Trouble relaxing 0 0 0 0  ?Restless 0 0 0 0  ?Easily annoyed or irritable 3 0 0 0  ?Afraid - awful might happen 1 0 0 0  ?Total GAD 7 Score 9 0 0 0  ? ? ? ?CMP Latest Ref Rng & Units 05/20/2021 04/10/2021 04/10/2021  ?Glucose 70 - 99 mg/dL 87 - -  ?BUN 8 - 27 mg/dL 11 - -  ?Creatinine 0.57 - 1.00 mg/dL 0.78 - -  ?Sodium 134 - 144 mmol/L 139 - -  ?Potassium 3.5 - 5.2 mmol/L 4.4 - -  ?Chloride 96 - 106 mmol/L 104 - -  ?CO2 20 - 29 mmol/L 23 - -  ?Calcium 8.7 - 10.3 mg/dL 10.3 10.7(H) 10.8(H)  ?Total Protein 6.0 - 8.5 g/dL 6.6 - 7.2  ?Total Bilirubin 0.0 - 1.2 mg/dL <0.2 - -  ?Alkaline Phos 44 - 121 IU/L 83 - -  ?AST 0 - 40 IU/L 15 - -  ?ALT 0 - 32 IU/L 13 - -  ? ?Lipid Panel  ?   ?Component Value Date/Time  ? CHOL 227 (H) 10/07/2020 1615  ? TRIG 92 10/07/2020 1615  ? HDL 55 10/07/2020 1615  ? CHOLHDL 4.1 10/07/2020 1615  ? CHOLHDL 3.1 03/14/2014 1102  ? VLDL 15 03/14/2014 1102  ? LDLCALC 156 (H) 10/07/2020 1615  ? ? ?CBC ?   ?Component Value Date/Time  ? WBC 6.8 10/07/2020 1615  ? WBC 9.9 10/10/2015 0520  ? RBC 4.67 10/07/2020 1615  ? RBC 3.28 (L) 10/10/2015 0520  ? HGB 12.0 10/07/2020 1615  ? HCT 38.7 10/07/2020 1615  ? PLT 366 10/07/2020 1615  ? MCV 83 10/07/2020 1615  ? MCH 25.7 (L) 10/07/2020 1615  ? MCH 25.0 (L) 10/10/2015 0520  ? MCHC 31.0 (L) 10/07/2020 1615  ? MCHC 31.5 10/10/2015 0520  ? RDW 14.6 10/07/2020 1615  ? LYMPHSABS CANCELED 06/07/2020 1623  ? MONOABS 0.9 03/14/2014 1102  ? EOSABS CANCELED 06/07/2020 1623  ? BASOSABS CANCELED 06/07/2020 1623  ? ? ?ASSESSMENT AND PLAN: ?1. Essential hypertension ?At goal.  Continue lisinopril and Norvasc. ? ?2. Tobacco dependence ?Continue to encourage her to consider quitting smoking. ? ?3.  Grief reaction ?Patient reports good support from her sister.  Does not feel she needs any counseling at this time but if she changes her mind she would do grief counseling through hospice ? ?4. Chronic insomni

## 2021-09-16 NOTE — Patient Instructions (Signed)

## 2022-01-07 ENCOUNTER — Other Ambulatory Visit: Payer: Self-pay | Admitting: Internal Medicine

## 2022-01-07 DIAGNOSIS — F5104 Psychophysiologic insomnia: Secondary | ICD-10-CM

## 2022-01-13 ENCOUNTER — Other Ambulatory Visit: Payer: Self-pay | Admitting: Internal Medicine

## 2022-01-13 DIAGNOSIS — I10 Essential (primary) hypertension: Secondary | ICD-10-CM

## 2022-02-15 ENCOUNTER — Other Ambulatory Visit: Payer: Self-pay | Admitting: Internal Medicine

## 2022-02-15 DIAGNOSIS — I10 Essential (primary) hypertension: Secondary | ICD-10-CM

## 2022-02-18 ENCOUNTER — Other Ambulatory Visit: Payer: Self-pay | Admitting: Internal Medicine

## 2022-02-18 DIAGNOSIS — I10 Essential (primary) hypertension: Secondary | ICD-10-CM

## 2022-02-19 NOTE — Telephone Encounter (Signed)
Requested medication (s) are due for refill today: Yes  Requested medication (s) are on the active medication list: Yes  Last refill:  01/13/22  Future visit scheduled: Yes- 04/21/22  Notes to clinic:  OK to refill? Pt. Has appointment scheduled.    Requested Prescriptions  Pending Prescriptions Disp Refills   amLODipine (NORVASC) 5 MG tablet [Pharmacy Med Name: amLODIPine Besylate 5 MG Oral Tablet] 30 tablet 0    Sig: TAKE 1 TABLET BY MOUTH ONCE DAILY . APPOINTMENT REQUIRED FOR FUTURE REFILLS     Cardiovascular: Calcium Channel Blockers 2 Passed - 02/18/2022  4:50 PM      Passed - Last BP in normal range    BP Readings from Last 1 Encounters:  09/16/21 129/77         Passed - Last Heart Rate in normal range    Pulse Readings from Last 1 Encounters:  09/16/21 64         Passed - Valid encounter within last 6 months    Recent Outpatient Visits           5 months ago Essential hypertension   [place], MD   9 months ago Essential hypertension   [place], Jarome Matin, RPH-CPP   10 months ago Essential hypertension   [place], Vernia Buff, NP   10 months ago Essential hypertension   Theresa Wedel, MD   1 year ago Pap smear for cervical cancer screening   [place], MD       Future Appointments             In 2 months Wynetta Emery Dalbert Batman, MD [place]

## 2022-03-23 ENCOUNTER — Other Ambulatory Visit: Payer: Self-pay | Admitting: Internal Medicine

## 2022-03-23 DIAGNOSIS — I10 Essential (primary) hypertension: Secondary | ICD-10-CM

## 2022-03-24 NOTE — Telephone Encounter (Signed)
Requested Prescriptions  Pending Prescriptions Disp Refills  . amLODipine (NORVASC) 5 MG tablet [Pharmacy Med Name: amLODIPine Besylate 5 MG Oral Tablet] 90 tablet 0    Sig: TAKE 1 TABLET BY MOUTH ONCE DAILY . APPOINTMENT REQUIRED FOR FUTURE REFILLS     Cardiovascular: Calcium Channel Blockers 2 Failed - 03/23/2022 10:09 AM      Failed - Valid encounter within last 6 months    Recent Outpatient Visits          6 months ago Essential hypertension   [place], Deborah B, MD   10 months ago Essential hypertension   [place], RPH-CPP   11 months ago Essential hypertension   [place], Vernia Buff, NP   11 months ago Essential hypertension   [place], MD   1 year ago Pap smear for cervical cancer screening   [place], MD      Future Appointments            In 4 weeks Ladell Pier, MD [place] BP in normal range    BP Readings from Last 1 Encounters:  09/16/21 129/77         Passed - Last Heart Rate in normal range    Pulse Readings from Last 1 Encounters:  09/16/21 64

## 2022-04-21 ENCOUNTER — Encounter: Payer: Self-pay | Admitting: Internal Medicine

## 2022-04-21 ENCOUNTER — Ambulatory Visit: Payer: BC Managed Care – PPO | Attending: Internal Medicine | Admitting: Internal Medicine

## 2022-04-21 VITALS — BP 122/85 | HR 94 | Ht 58.5 in | Wt 129.0 lb

## 2022-04-21 DIAGNOSIS — Z2821 Immunization not carried out because of patient refusal: Secondary | ICD-10-CM

## 2022-04-21 DIAGNOSIS — F17200 Nicotine dependence, unspecified, uncomplicated: Secondary | ICD-10-CM | POA: Diagnosis not present

## 2022-04-21 DIAGNOSIS — E782 Mixed hyperlipidemia: Secondary | ICD-10-CM | POA: Diagnosis not present

## 2022-04-21 DIAGNOSIS — F5104 Psychophysiologic insomnia: Secondary | ICD-10-CM | POA: Diagnosis not present

## 2022-04-21 DIAGNOSIS — Z5320 Procedure and treatment not carried out because of patient's decision for unspecified reasons: Secondary | ICD-10-CM

## 2022-04-21 DIAGNOSIS — I10 Essential (primary) hypertension: Secondary | ICD-10-CM

## 2022-04-21 MED ORDER — AMLODIPINE BESYLATE 5 MG PO TABS: ORAL_TABLET | ORAL | 2 refills | Status: DC

## 2022-04-21 MED ORDER — LISINOPRIL 20 MG PO TABS: 20.0000 mg | ORAL_TABLET | Freq: Every day | ORAL | 2 refills | Status: DC

## 2022-04-21 MED ORDER — MELOXICAM 15 MG PO TABS
15.0000 mg | ORAL_TABLET | Freq: Every day | ORAL | 5 refills | Status: DC
Start: 2022-04-21 — End: 2022-08-20

## 2022-04-21 NOTE — Patient Instructions (Signed)
Please call [phone] Quit-Now to request the nicotine patches for free.
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
## 2022-05-06 ENCOUNTER — Ambulatory Visit: Payer: BC Managed Care – PPO | Attending: Family Medicine

## 2022-05-06 DIAGNOSIS — I10 Essential (primary) hypertension: Secondary | ICD-10-CM | POA: Diagnosis not present

## 2022-05-06 DIAGNOSIS — E782 Mixed hyperlipidemia: Secondary | ICD-10-CM

## 2022-05-07 LAB — COMPREHENSIVE METABOLIC PANEL
ALT: 12 IU/L (ref 0–32)
AST: 15 IU/L (ref 0–40)
Albumin/Globulin Ratio: 1.8 (ref 1.2–2.2)
Albumin: 4.3 g/dL (ref 3.9–4.9)
Alkaline Phosphatase: 80 IU/L (ref 44–121)
BUN/Creatinine Ratio: 14 (ref 12–28)
BUN: 9 mg/dL (ref 8–27)
Bilirubin Total: 0.3 mg/dL (ref 0.0–1.2)
CO2: 22 mmol/L (ref 20–29)
Calcium: 10.2 mg/dL (ref 8.7–10.3)
Chloride: 102 mmol/L (ref 96–106)
Creatinine, Ser: 0.66 mg/dL (ref 0.57–1.00)
Globulin, Total: 2.4 g/dL (ref 1.5–4.5)
Glucose: 89 mg/dL (ref 70–99)
Potassium: 4.1 mmol/L (ref 3.5–5.2)
Sodium: 137 mmol/L (ref 134–144)
Total Protein: 6.7 g/dL (ref 6.0–8.5)
eGFR: 97 mL/min/{1.73_m2} (ref >59)

## 2022-05-07 LAB — LIPID PANEL
Chol/HDL Ratio: 3.2 ratio (ref 0.0–4.4)
Cholesterol, Total: 184 mg/dL (ref 100–199)
HDL: 57 mg/dL (ref >39)
LDL Chol Calc (NIH): 108 mg/dL — ABNORMAL HIGH (ref 0–99)
Triglycerides: 106 mg/dL (ref 0–149)
VLDL Cholesterol Cal: 19 mg/dL (ref 5–40)

## 2022-05-07 LAB — CBC
Hematocrit: 39.4 % (ref 34.0–46.6)
Hemoglobin: 12.6 g/dL (ref 11.1–15.9)
MCH: 26.5 pg — ABNORMAL LOW (ref 26.6–33.0)
MCHC: 32 g/dL (ref 31.5–35.7)
MCV: 83 fL (ref 79–97)
Platelets: 361 10*3/uL (ref 150–450)
RBC: 4.75 x10E6/uL (ref 3.77–5.28)
RDW: 14.6 % (ref 11.7–15.4)
WBC: 8.8 10*3/uL (ref 3.4–10.8)

## 2022-05-07 NOTE — Progress Notes (Signed)
Let patient know that her cholesterol level has improved from 156 now to 108 with goal being less than 100.  Continue healthy eating habits. Blood cell counts are normal. Kidney and liver function tests are normal.

## 2022-05-18 ENCOUNTER — Other Ambulatory Visit: Payer: Self-pay | Admitting: Internal Medicine

## 2022-05-18 DIAGNOSIS — F5104 Psychophysiologic insomnia: Secondary | ICD-10-CM

## 2022-05-18 NOTE — Telephone Encounter (Signed)
Requested medication (s) are due for refill today - yes  Requested medication (s) are on the active medication list -yes  Future visit scheduled -yes  Last refill: 01/07/22 #30 1RF  Notes to clinic: non delegated Rx  Requested Prescriptions  Pending Prescriptions Disp Refills   zolpidem (AMBIEN) 5 MG tablet [Pharmacy Med Name: Zolpidem Tartrate 5 MG Oral Tablet] 30 tablet 0    Sig: TAKE 1 TABLET BY MOUTH AT BEDTIME AS NEEDED FOR SLEEP     Not Delegated - Psychiatry:  Anxiolytics/Hypnotics Failed - 05/18/2022 10:33 AM      Failed - This refill cannot be delegated      Failed - Urine Drug Screen completed in last 360 days      Passed - Valid encounter within last 6 months    Recent Outpatient Visits           3 weeks ago Essential hypertension   [place], Deborah B, MD   8 months ago Essential hypertension   [place], Deborah B, MD   12 months ago Essential hypertension   [place], Jarome Matin, RPH-CPP   1 year ago Essential hypertension   [place], Vernia Buff, NP   1 year ago Essential hypertension   [place], MD       Future Appointments             In 3 months Wynetta Emery, Dalbert Batman, MD [place]               Requested Prescriptions  Pending Prescriptions Disp Refills   zolpidem (AMBIEN) 5 MG tablet [Pharmacy Med Name: Zolpidem Tartrate 5 MG Oral Tablet] 30 tablet 0    Sig: TAKE 1 TABLET BY MOUTH AT BEDTIME AS NEEDED FOR SLEEP     Not Delegated - Psychiatry:  Anxiolytics/Hypnotics Failed - 05/18/2022 10:33 AM      Failed - This refill cannot be delegated      Failed - Urine Drug Screen completed in last 360 days      Passed - Valid encounter within last 6 months    Recent Outpatient Visits           3 weeks ago  Essential hypertension   [place], MD   8 months ago Essential hypertension   [place], Deborah B, MD   12 months ago Essential hypertension   [place], [place], RPH-CPP   1 year ago Essential hypertension   [place], Vernia Buff, NP   1 year ago Essential hypertension   [place], MD       Future Appointments             In 3 months Wynetta Emery, Dalbert Batman, MD [place]

## 2022-06-09 ENCOUNTER — Telehealth: Payer: Self-pay | Admitting: Internal Medicine

## 2022-06-09 NOTE — Telephone Encounter (Signed)
Pt wants a nicotine patch prescription per last visit. She called the 800 number she was provided and was unable to speak to anyone.   Wants a Rx for 14 MG not 21 called into [facility] [place] [facility] [place]  [address] [place] [address]  Phone: [phone] Fax: [phone]

## 2022-06-10 MED ORDER — NICOTINE 14 MG/24HR TD PT24: 14.0000 mg | MEDICATED_PATCH | Freq: Every day | TRANSDERMAL | 1 refills | Status: DC

## 2022-06-10 NOTE — Telephone Encounter (Signed)
Called & LVM for patient to call back. Telephone encounter made.

## 2022-06-11 NOTE — Telephone Encounter (Signed)
Called LVM for patient to call back

## 2022-06-12 NOTE — Telephone Encounter (Signed)
Called & spoke to the patient. Verified name & DOB. Informed that the nicotine patches were sent to the pharmacy. Patient expressed understanding.

## 2022-08-11 ENCOUNTER — Other Ambulatory Visit: Payer: Self-pay | Admitting: Internal Medicine

## 2022-08-11 DIAGNOSIS — Z1231 Encounter for screening mammogram for malignant neoplasm of breast: Secondary | ICD-10-CM

## 2022-08-20 ENCOUNTER — Ambulatory Visit: Payer: BC Managed Care – PPO | Attending: Internal Medicine | Admitting: Internal Medicine

## 2022-08-20 ENCOUNTER — Encounter: Payer: Self-pay | Admitting: Internal Medicine

## 2022-08-20 VITALS — BP 131/79 | HR 84 | Temp 98.2°F | Ht <= 58 in | Wt 135.0 lb

## 2022-08-20 DIAGNOSIS — Z87891 Personal history of nicotine dependence: Secondary | ICD-10-CM

## 2022-08-20 DIAGNOSIS — F5104 Psychophysiologic insomnia: Secondary | ICD-10-CM | POA: Diagnosis not present

## 2022-08-20 DIAGNOSIS — E663 Overweight: Secondary | ICD-10-CM

## 2022-08-20 DIAGNOSIS — E782 Mixed hyperlipidemia: Secondary | ICD-10-CM | POA: Diagnosis not present

## 2022-08-20 DIAGNOSIS — I10 Essential (primary) hypertension: Secondary | ICD-10-CM

## 2022-08-20 MED ORDER — MELOXICAM 15 MG PO TABS: 15.0000 mg | ORAL_TABLET | Freq: Every day | ORAL | 5 refills | Status: DC

## 2022-08-20 MED ORDER — AMLODIPINE BESYLATE 5 MG PO TABS: ORAL_TABLET | ORAL | 2 refills | Status: DC

## 2022-08-20 MED ORDER — ZOLPIDEM TARTRATE 5 MG PO TABS: 5.0000 mg | ORAL_TABLET | Freq: Every evening | ORAL | 1 refills | Status: DC | PRN

## 2022-08-20 MED ORDER — LISINOPRIL 20 MG PO TABS: 20.0000 mg | ORAL_TABLET | Freq: Every day | ORAL | 2 refills | Status: DC

## 2022-08-20 NOTE — Patient Instructions (Signed)

## 2022-08-20 NOTE — Progress Notes (Signed)
Patient ID: Martha Manning, female    DOB: Mar 28, 1956  MRN: [id number]  CC: Hypertension (HTN f/u. Med refill - amlodipine, ambien, meloxicam)   Subjective: Martha Manning is a 67 y.o. female who presents for chronic ds management Her concerns today include:  history of HTN, HL (declined statin) tob dependence, obesity, OA of right hip status post THA, insomnia   HTN: Patient is on lisinopril 20 mg daily and Norvasc 5 mg daily.  Took meds already today.  Limits salt Not checking in past 1 mth.  No chest pains or shortness of breath. I went over lab test with her from last visit.  Total and LDL cholesterol had significantly improved.  LDL cholesterol went from 156-108.  Tob dep: quit smoking the beginning of the yr Gained 6 pounds since last visit.  She attributes this to overeating during the Christmas holiday.  Also states that they keep a bowl of candy at work and she is constantly eating those through the day.  She goes to aerobic classes twice a week.  Insomnia:  doing good on Ambien which she takes as needed.  No excessive daytime sleepiness.  Requesting refill.   Patient Active Problem List   Diagnosis Date Noted   Influenza vaccine refused 06/07/2020   Overweight (BMI 25.0-29.9) 06/07/2020   Obesity (BMI 30-39.9) 05/04/2019   Mixed hyperlipidemia 05/04/2019   Sinus congestion 05/04/2019   Gastroesophageal reflux disease without esophagitis 07/26/2018   Diabetes mellitus screening 12/21/2016   Status post total replacement of right hip 10/08/2015   Ganglion of left wrist 05/16/2015   Essential hypertension 03/14/2014   Osteoarthritis of right hip 03/14/2014   Smoking 03/14/2014     Current Outpatient Medications on File Prior to Visit  Medication Sig Dispense Refill   aspirin 81 MG chewable tablet Chew 81 mg by mouth daily.     fluticasone (FLONASE) 50 MCG/ACT nasal spray Place 1 spray into both nostrils daily as needed for allergies or rhinitis. 16 g 3    hydrOXYzine (ATARAX/VISTARIL) 25 MG tablet TAKE 1 TO 2 TABLETS BY MOUTH EVERY DAY AT BEDTIME AS NEEDED 180 tablet 3   loratadine (CLARITIN) 10 MG tablet Take 10 mg by mouth daily as needed for allergies or itching.     Multiple Vitamin (MULTIVITAMIN WITH MINERALS) TABS tablet Take 1 tablet by mouth daily as needed (for supplementation). Reported on 12/02/2015     nicotine (NICODERM CQ - DOSED IN MG/24 HOURS) 14 mg/24hr patch Place 1 patch (14 mg total) onto the skin daily. 28 patch 1   omeprazole (PRILOSEC) 20 MG capsule Take 1 capsule by mouth once daily 30 capsule 0   No current facility-administered medications on file prior to visit.    Allergies  Allergen Reactions   Carvedilol     Makes her feel winded when she walks upstairs.   Clindamycin/Lincomycin     Pt states she took this antibiotic once and broke out in a rash.    Codeine    Norvasc [Amlodipine Besylate] Swelling    Social History   Socioeconomic History   Marital status: Divorced    Spouse name: Not on file   Number of children: Not on file   Years of education: Not on file   Highest education level: Not on file  Occupational History   Not on file  Tobacco Use   Smoking status: Every Day    Packs/day: 0.50    Years: 40.00    Total pack years: 20.00  Types: Cigarettes   Smokeless tobacco: Never   Tobacco comments:    7 cigs a day   Substance and Sexual Activity   Alcohol use: Yes    Alcohol/week: 7.0 standard drinks of alcohol    Types: 7 Glasses of wine per week    Comment: 7 glasses of wine per week, one per day   Drug use: No   Sexual activity: Not Currently  Other Topics Concern   Not on file  Social History Narrative   Not on file   Social Determinants of Health   Financial Resource Strain: Not on file  Food Insecurity: Not on file  Transportation Needs: Not on file  Physical Activity: Not on file  Stress: Not on file  Social Connections: Not on file  Intimate Partner Violence: Not on file     Family History  Problem Relation Age of Onset   Hypertension Mother    Stroke Mother    Hypertension Sister    Colon cancer Neg Hx    Esophageal cancer Neg Hx    Rectal cancer Neg Hx    Stomach cancer Neg Hx    Pancreatic cancer Neg Hx    Prostate cancer Neg Hx     Past Surgical History:  Procedure Laterality Date   DENTAL SURGERY     TOTAL HIP ARTHROPLASTY Right 10/08/2015   Procedure: RIGHT TOTAL HIP ARTHROPLASTY ANTERIOR APPROACH;  Surgeon: Mcarthur Rossetti, MD;  Location: [facility];  Service: Orthopedics;  Laterality: Right;   VAGINAL DELIVERY     41 yrs ago    ROS: Review of Systems Negative except as stated above  PHYSICAL EXAM: BP 131/79   Pulse 84   Temp 98.2 F (36.8 C) (Oral)   Ht 4' 10"$  (1.473 m)   Wt 135 lb (61.2 kg)   SpO2 99%   BMI 28.22 kg/m   Wt Readings from Last 3 Encounters:  08/20/22 135 lb (61.2 kg)  04/21/22 129 lb (58.5 kg)  09/16/21 128 lb (58.1 kg)    Physical Exam  General appearance - alert, well appearing, and in no distress Mental status - normal mood, behavior, speech, dress, motor activity, and thought processes Neck - supple, no significant adenopathy Chest - clear to auscultation, no wheezes, rales or rhonchi, symmetric air entry Heart - normal rate, regular rhythm, normal S1, S2, no murmurs, rubs, clicks or gallops Extremities - peripheral pulses normal, no pedal edema, no clubbing or cyanosis      Latest Ref Rng & Units 05/06/2022    8:45 AM 05/20/2021    3:49 PM 04/10/2021    9:07 AM  CMP  Glucose 70 - 99 mg/dL 89  87    BUN 8 - 27 mg/dL 9  11    Creatinine 0.57 - 1.00 mg/dL 0.66  0.78    Sodium 134 - 144 mmol/L 137  139    Potassium 3.5 - 5.2 mmol/L 4.1  4.4    Chloride 96 - 106 mmol/L 102  104    CO2 20 - 29 mmol/L 22  23    Calcium 8.7 - 10.3 mg/dL 10.2  10.3  10.8    10.7   Total Protein 6.0 - 8.5 g/dL 6.7  6.6  7.2   Total Bilirubin 0.0 - 1.2 mg/dL 0.3  <0.2    Alkaline Phos 44 - 121 IU/L 80  83     AST 0 - 40 IU/L 15  15    ALT 0 - 32 IU/L 12  13     Lipid Panel     Component Value Date/Time   CHOL 184 05/06/2022 0845   TRIG 106 05/06/2022 0845   HDL 57 05/06/2022 0845   CHOLHDL 3.2 05/06/2022 0845   CHOLHDL 3.1 03/14/2014 1102   VLDL 15 03/14/2014 1102   LDLCALC 108 (H) 05/06/2022 0845    CBC    Component Value Date/Time   WBC 8.8 05/06/2022 0845   WBC 9.9 10/10/2015 0520   RBC 4.75 05/06/2022 0845   RBC 3.28 (L) 10/10/2015 0520   HGB 12.6 05/06/2022 0845   HCT 39.4 05/06/2022 0845   PLT 361 05/06/2022 0845   MCV 83 05/06/2022 0845   MCH 26.5 (L) 05/06/2022 0845   MCH 25.0 (L) 10/10/2015 0520   MCHC 32.0 05/06/2022 0845   MCHC 31.5 10/10/2015 0520   RDW 14.6 05/06/2022 0845   LYMPHSABS CANCELED 06/07/2020 1623   MONOABS 0.9 03/14/2014 1102   EOSABS CANCELED 06/07/2020 1623   BASOSABS CANCELED 06/07/2020 1623    ASSESSMENT AND PLAN: 1. Essential hypertension Close to goal.  She will continue Norvasc 5 mg daily and lisinopril 20 mg daily. - amLODipine (NORVASC) 5 MG tablet; TAKE 1 TABLET BY MOUTH ONCE DAILY . APPOINTMENT REQUIRED FOR FUTURE REFILLS  Dispense: 90 tablet; Refill: 2 - lisinopril (ZESTRIL) 20 MG tablet; Take 1 tablet (20 mg total) by mouth daily.  Dispense: 90 tablet; Refill: 2  2. Chronic insomnia Continue to encourage good sleep hygiene.  Refill given on Ambien to use as needed.  [place] controlled substance reporting system reviewed and is appropriate. - zolpidem (AMBIEN) 5 MG tablet; Take 1 tablet (5 mg total) by mouth at bedtime as needed. for sleep  Dispense: 30 tablet; Refill: 1  3. Mixed hyperlipidemia Commended her on improvement in her cholesterol levels.  Discussed and encourage healthy eating habits.  4. Former smoker Commended her on quitting.  Encouraged her to remain tobacco free.  5. Overweight (BMI 25.0-29.9) Strongly advised that she discontinue eating candies at work.  I recommend that she take some fruits or nuts to  snack on instead.  Continue regular exercise.     Patient was given the opportunity to ask questions.  Patient verbalized understanding of the plan and was able to repeat key elements of the plan.   This documentation was completed using Radio producer.  Any transcriptional errors are unintentional.  No orders of the defined types were placed in this encounter.    Requested Prescriptions   Signed Prescriptions Disp Refills   amLODipine (NORVASC) 5 MG tablet 90 tablet 2    Sig: TAKE 1 TABLET BY MOUTH ONCE DAILY . APPOINTMENT REQUIRED FOR FUTURE REFILLS   lisinopril (ZESTRIL) 20 MG tablet 90 tablet 2    Sig: Take 1 tablet (20 mg total) by mouth daily.   meloxicam (MOBIC) 15 MG tablet 30 tablet 5    Sig: Take 1 tablet (15 mg total) by mouth daily.   zolpidem (AMBIEN) 5 MG tablet 30 tablet 1    Sig: Take 1 tablet (5 mg total) by mouth at bedtime as needed. for sleep    Return in about 4 months (around 12/19/2022).  Karle Plumber, MD, FACP

## 2022-09-23 ENCOUNTER — Ambulatory Visit
Admission: RE | Admit: 2022-09-23 | Discharge: 2022-09-23 | Disposition: A | Discharge status: Home / Self Care | Payer: BC Managed Care – PPO | Source: Ambulatory Visit | Attending: Internal Medicine | Admitting: Internal Medicine

## 2022-09-23 DIAGNOSIS — Z1231 Encounter for screening mammogram for malignant neoplasm of breast: Secondary | ICD-10-CM | POA: Diagnosis not present

## 2022-11-26 IMAGING — MG DIGITAL SCREENING BILAT W/ TOMO W/ CAD
8 series · 8 of 24 positions shown · non-contrast
Comparison: Previous exam(s).

CLINICAL DATA: Screening.

EXAM:
DIGITAL SCREENING BILATERAL MAMMOGRAM WITH TOMO AND CAD

[L CC synth-2D]
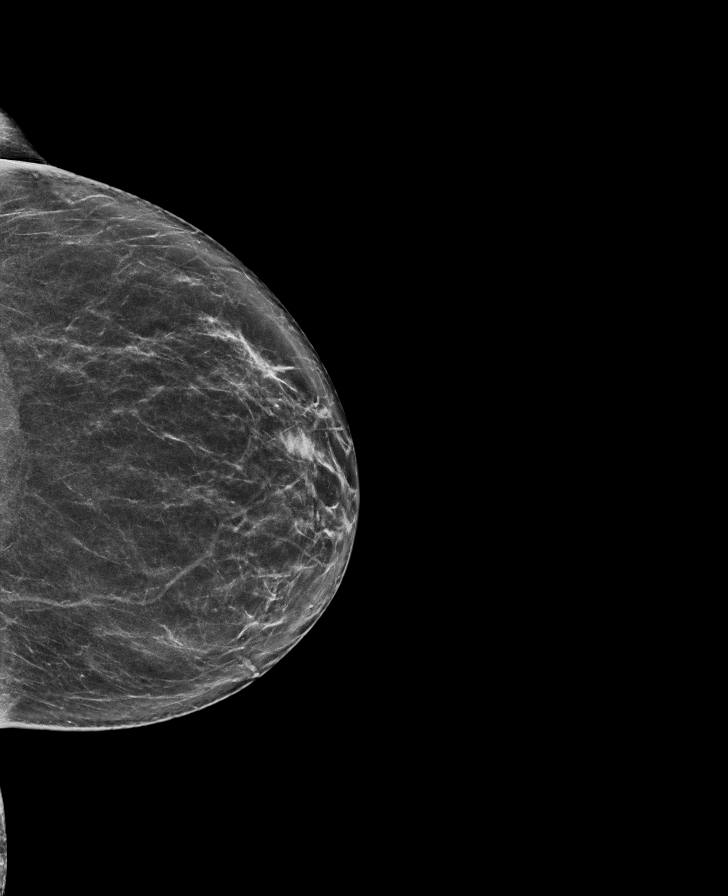

[R MLO synth-2D]
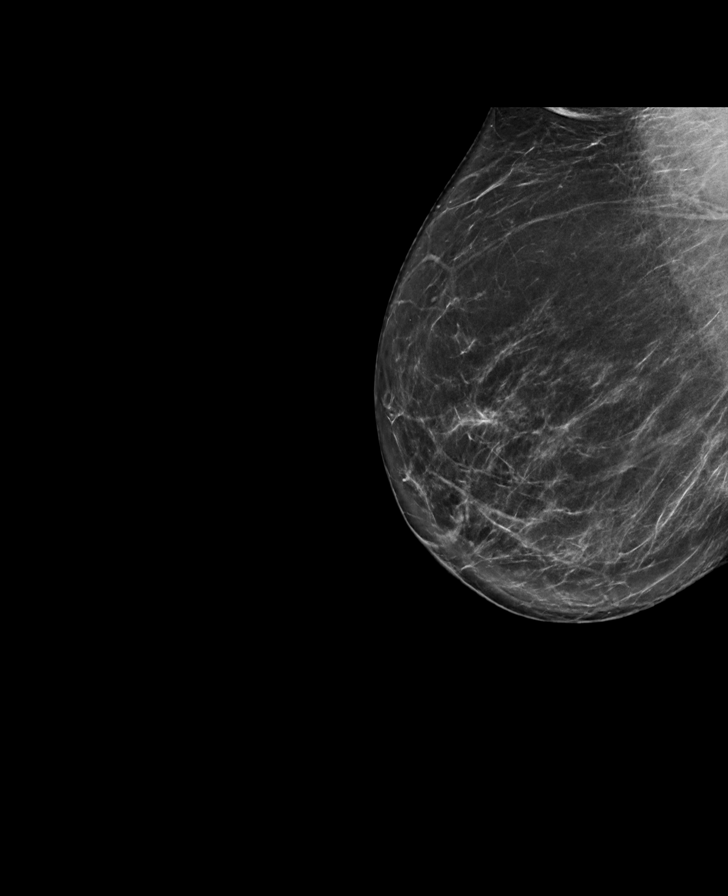

[L MLO synth-2D]
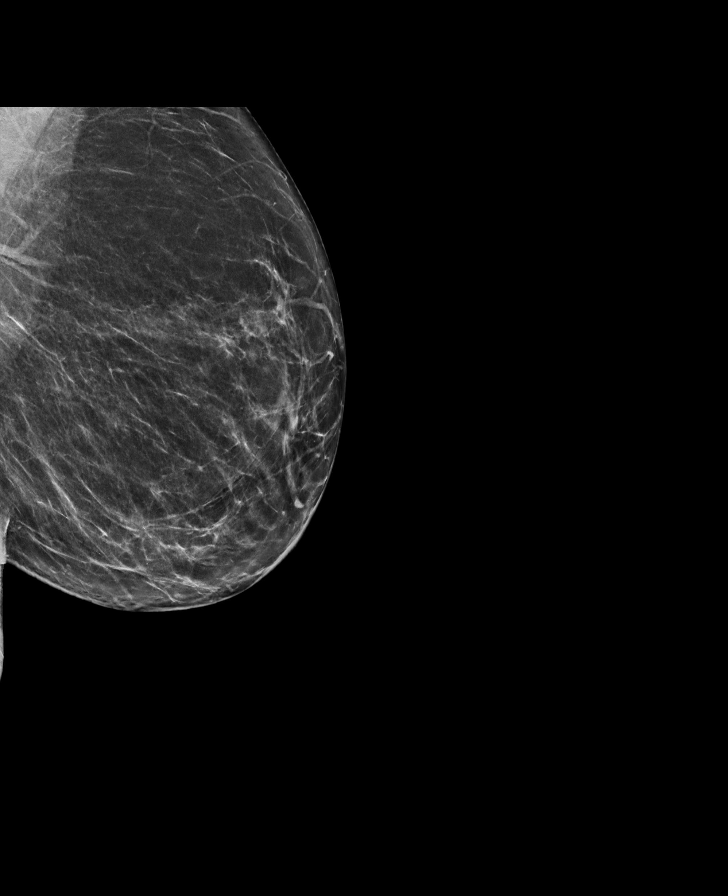

[R CC synth-2D]
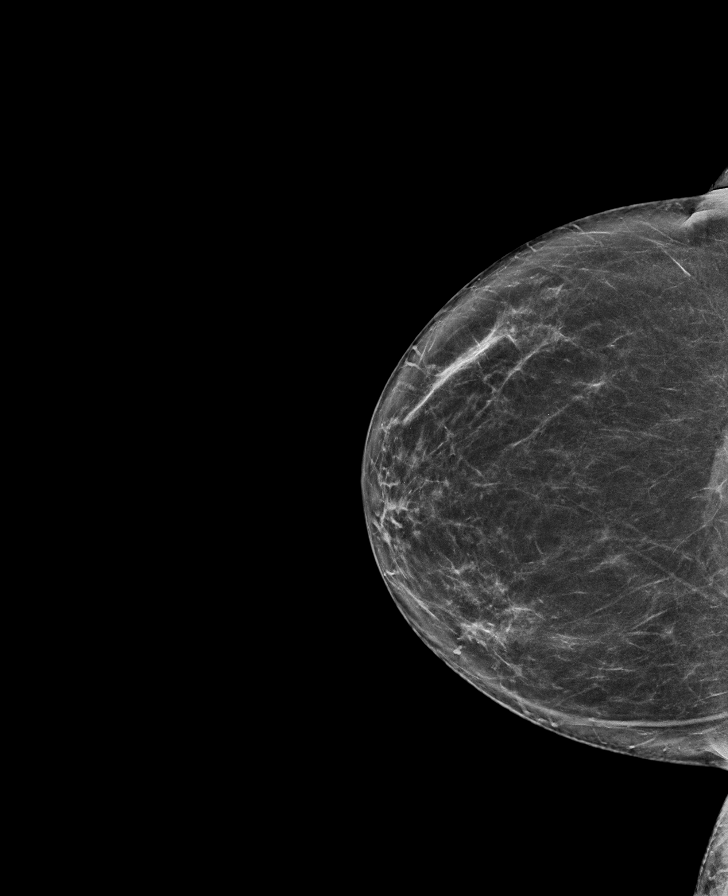

[L CC tomo · tomo slice 36/71.0]
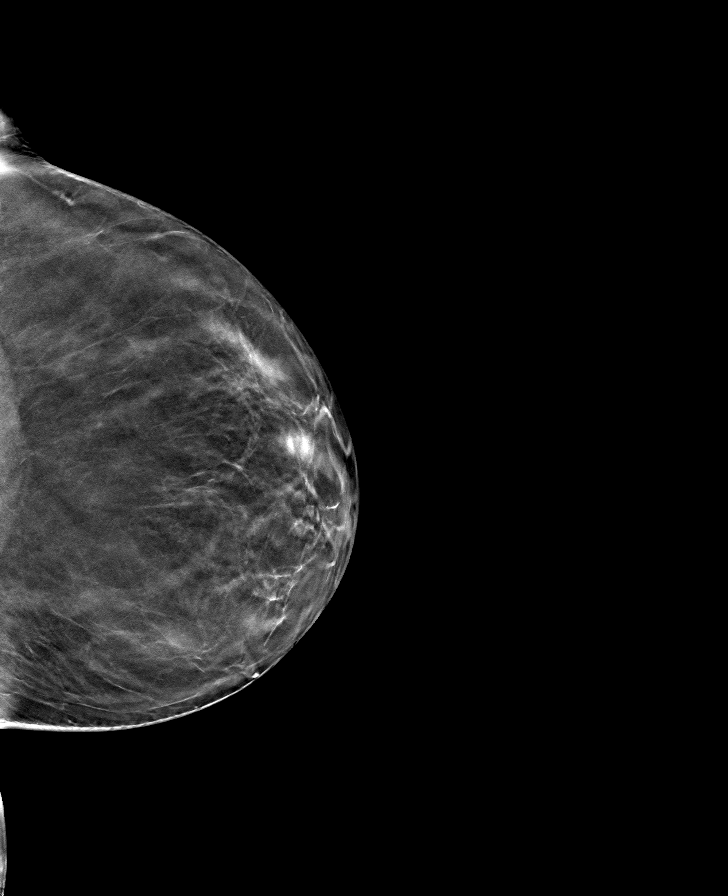

[R CC tomo · tomo slice 37/74.0]
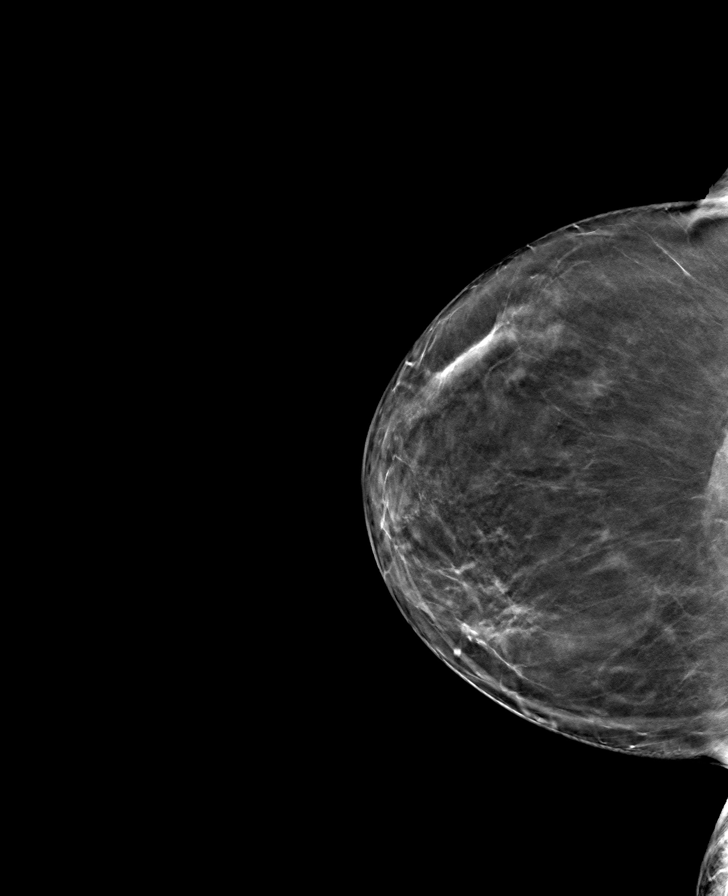

[L MLO tomo · tomo slice 37/73.0]
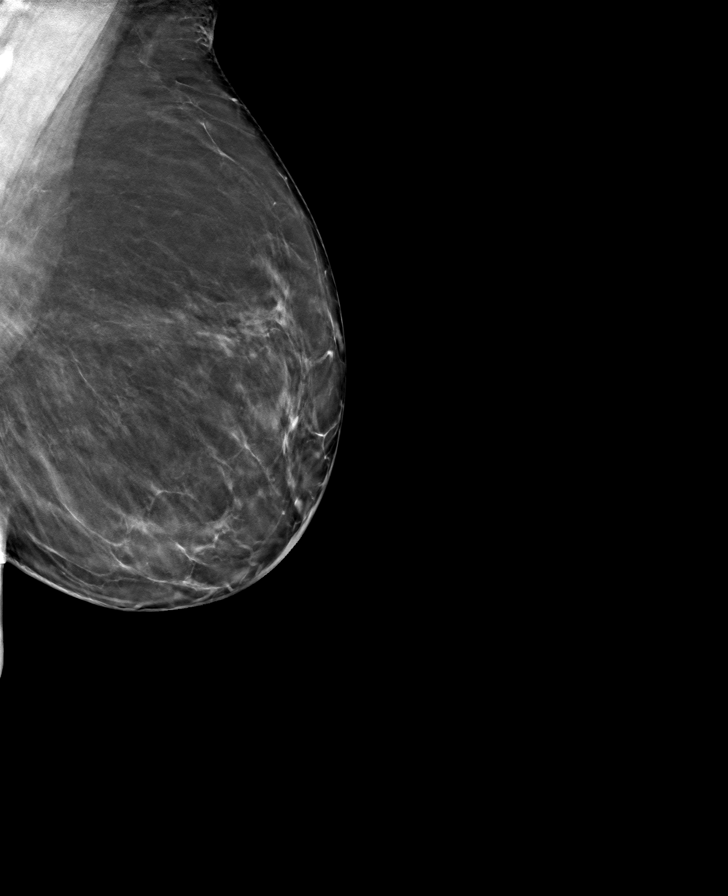

[R MLO tomo · tomo slice 41/81.0]
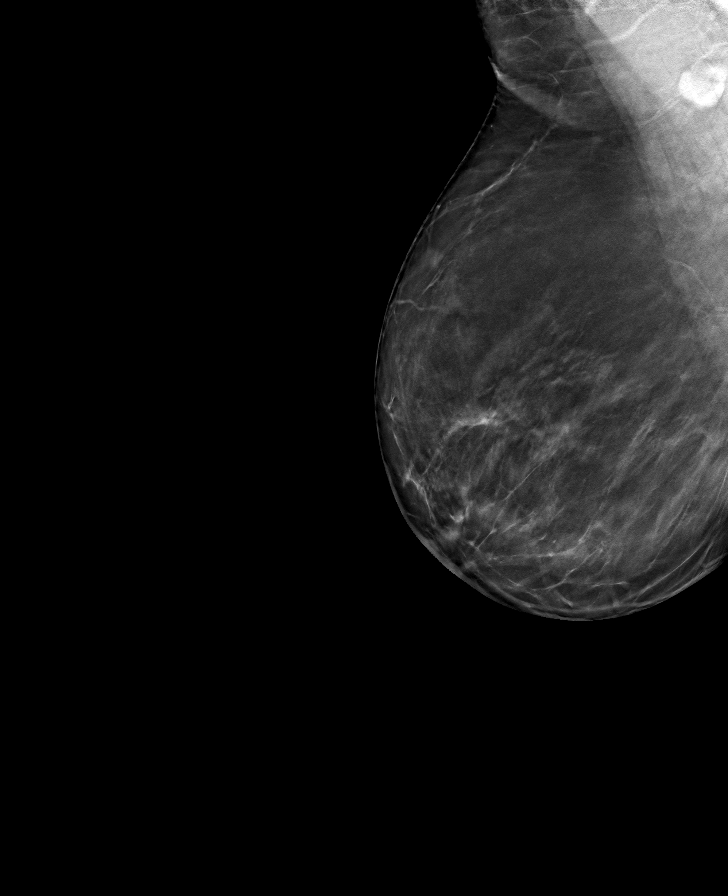

[8 of 24 positions shown; findings below may reference images not displayed]

ACR Breast Density Category b: There are scattered areas of
fibroglandular density.
FINDINGS: There are no findings suspicious for malignancy. Images were
processed with CAD.
IMPRESSION: No mammographic evidence of malignancy. A result letter of this
screening mammogram will be mailed directly to the patient.

RECOMMENDATION:
Screening mammogram in one year. (Code:[id number])

BI-RADS CATEGORY  1: Negative.

## 2022-12-22 ENCOUNTER — Other Ambulatory Visit (INDEPENDENT_AMBULATORY_CARE_PROVIDER_SITE_OTHER): Payer: BC Managed Care – PPO

## 2022-12-22 ENCOUNTER — Encounter: Payer: Self-pay | Admitting: Physician Assistant

## 2022-12-22 ENCOUNTER — Ambulatory Visit (INDEPENDENT_AMBULATORY_CARE_PROVIDER_SITE_OTHER): Payer: BC Managed Care – PPO | Admitting: Physician Assistant

## 2022-12-22 DIAGNOSIS — M542 Cervicalgia: Secondary | ICD-10-CM

## 2022-12-22 MED ORDER — METHOCARBAMOL 500 MG PO TABS: 500.0000 mg | ORAL_TABLET | Freq: Four times a day (QID) | ORAL | 0 refills | Status: DC | PRN

## 2022-12-22 NOTE — Progress Notes (Signed)
Gen. Depo Medrol (methylprednisolone acetate) 40 mg (1/2 cc of 80 mg/mL) given IM right dorsogluteal region.

## 2022-12-22 NOTE — Progress Notes (Signed)
Office Visit Note   Patient: Martha Manning           Date of Birth: 03/04/1956           MRN: [id number] Visit Date: 12/22/2022              Requested by: Marcine Matar, MD [address] [place] [address] [place],  [place] [address] PCP: Marcine Matar, MD   Assessment & Plan: Visit Diagnoses:  1. Pain in neck   2. Neck pain     Plan: Patient comes in today complaining of about a year history of neck pain.  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  She is not sure if this helped.  She gives a remote history of neck issues which was treated via chiropractic care many years ago.  X-rays today show straightening of the normal lordotic curve and she has significant degenerative changes of the C-spine.  Fortunately she is completely neurologically intact and just has pain with flexion of her neck and along the right side of her neck into the top of her shoulder.  No radiculopathy at all.  Her strength is intact.  She does take Mobic when she needs it.  We discussed possibly taking Mobic on a regular basis for a couple weeks also go discussed some topical Voltaren gel.  If she has any progression of her symptoms she is to contact me immediately.  She had gotten an IM injection of steroid for her lower back about a year ago when she saw Gill.  She had good response to it we can go forward with that today as well.  We discussed physical therapy she declines that at this time.  Certainly if she had ongoing symptoms would recommend an MRI Follow-Up Instructions: No follow-ups on file.   Orders:  Orders Placed This Encounter  Procedures   XR Cervical Spine 2 or 3 views   No orders of the defined types were placed in this encounter.     Procedures: No procedures performed   Clinical Data: No additional findings.   Subjective: Chief Complaint  Patient presents with   Neck - Pain    HPI patient is a pleasant  67 year old woman with a chief complaint of neck pain.  She said it originally started about a year ago when she was using a computer at work.  For the past 3 months she has had pain in the right side of the neck starts behind her ear and down into the trap.  No radiation into her arm no difficulties with her right upper extremity.  She has tried IcyHot CBD ointment.  Denies any injury.  Rates her pain as moderate and persistent  Review of Systems  All other systems reviewed and are negative.    Objective: Vital Signs: There were no vitals taken for this visit.  Physical Exam Constitutional:      Appearance: Normal appearance.  HENT:     Head: Normocephalic.  Pulmonary:     Effort: Pulmonary effort is normal.     Breath sounds: Normal breath sounds.  Skin:    General: Skin is warm and dry.  Neurological:     General: No focal deficit present.     Mental Status: She is alert and oriented to person, place, and time.  Psychiatric:        Mood and Affect: Mood normal.    Ortho  Exam Examination patient appears well.  No atrophy of her muscles.  No step-off in her spine.  Has some pain in the paravertebral muscles especially on the right side of her pan spine going down into her trapezius.  She has no muscle wasting.  She has equal strength 5 out of 5 with resisted abduction adduction triceps and biceps.  Grip strength is intact sensation is intact Specialty Comments:  No specialty comments available.  Imaging: No results found.   PMFS History: Patient Active Problem List   Diagnosis Date Noted   Neck pain 12/22/2022   Former smoker 08/20/2022   Influenza vaccine refused 06/07/2020   Overweight (BMI 25.0-29.9) 06/07/2020   Obesity (BMI 30-39.9) 05/04/2019   Mixed hyperlipidemia 05/04/2019   Sinus congestion 05/04/2019   Gastroesophageal reflux disease without esophagitis 07/26/2018   Diabetes mellitus screening 12/21/2016   Status post total replacement of right hip  10/08/2015   Ganglion of left wrist 05/16/2015   Essential hypertension 03/14/2014   Osteoarthritis of right hip 03/14/2014   Smoking 03/14/2014   Past Medical History:  Diagnosis Date   Allergy    Arthritis    "right hip" (10/08/2015)   GERD (gastroesophageal reflux disease)    depends on diet    Hypertension     Family History  Problem Relation Age of Onset   Hypertension Mother    Stroke Mother    Hypertension Sister    Colon cancer Neg Hx    Esophageal cancer Neg Hx    Rectal cancer Neg Hx    Stomach cancer Neg Hx    Pancreatic cancer Neg Hx    Prostate cancer Neg Hx     Past Surgical History:  Procedure Laterality Date   DENTAL SURGERY     TOTAL HIP ARTHROPLASTY Right 10/08/2015   Procedure: RIGHT TOTAL HIP ARTHROPLASTY ANTERIOR APPROACH;  Surgeon: Kathryne Hitch, MD;  Location: [facility];  Service: Orthopedics;  Laterality: Right;   VAGINAL DELIVERY     41 yrs ago   Social History   Occupational History   Not on file  Tobacco Use   Smoking status: Former    Types: Cigarettes    Quit date: 06/29/2022    Years since quitting: 0.4   Smokeless tobacco: Former   Tobacco comments:    7 cigs a day   Substance and Sexual Activity   Alcohol use: Yes    Alcohol/week: 7.0 standard drinks of alcohol    Types: 7 Glasses of wine per week    Comment: 7 glasses of wine per week, one per day   Drug use: No   Sexual activity: Not Currently

## 2022-12-24 ENCOUNTER — Ambulatory Visit: Payer: BC Managed Care – PPO | Admitting: Internal Medicine

## 2022-12-30 ENCOUNTER — Telehealth: Payer: Self-pay | Admitting: Internal Medicine

## 2022-12-30 NOTE — Telephone Encounter (Signed)
Pt is calling in because she had an appointment on 12/24/22 and had to cancel because Dr. Laural Benes was sick. Pt rescheduled for October 11, and per pt, sometimes Dr. Laural Benes won't refill her medication until she's been seen. Pt wants to know will she still be able to get her refills up until her appointment and if not, what is she supposed to do about refills? Please advise.

## 2022-12-30 NOTE — Telephone Encounter (Signed)
Called patient to get in for sooner appt.  No answer.  Reserved appt with Dr. Laural Benes on 7/29 at 1:50.  Was calling to see if she can make this apt and to see what refills she is needing per her message.

## 2023-01-01 ENCOUNTER — Other Ambulatory Visit: Payer: Self-pay | Admitting: Internal Medicine

## 2023-01-01 DIAGNOSIS — F5104 Psychophysiologic insomnia: Secondary | ICD-10-CM

## 2023-01-25 ENCOUNTER — Ambulatory Visit: Payer: BC Managed Care – PPO | Admitting: Internal Medicine

## 2023-04-09 ENCOUNTER — Encounter: Payer: Self-pay | Admitting: Internal Medicine

## 2023-04-09 ENCOUNTER — Ambulatory Visit: Payer: BC Managed Care – PPO | Attending: Internal Medicine | Admitting: Internal Medicine

## 2023-04-09 VITALS — BP 115/76 | HR 75 | Ht <= 58 in | Wt 135.0 lb

## 2023-04-09 DIAGNOSIS — Z2821 Immunization not carried out because of patient refusal: Secondary | ICD-10-CM

## 2023-04-09 DIAGNOSIS — M62838 Other muscle spasm: Secondary | ICD-10-CM

## 2023-04-09 DIAGNOSIS — F5104 Psychophysiologic insomnia: Secondary | ICD-10-CM | POA: Diagnosis not present

## 2023-04-09 DIAGNOSIS — E663 Overweight: Secondary | ICD-10-CM | POA: Diagnosis not present

## 2023-04-09 DIAGNOSIS — Z87891 Personal history of nicotine dependence: Secondary | ICD-10-CM

## 2023-04-09 DIAGNOSIS — I10 Essential (primary) hypertension: Secondary | ICD-10-CM | POA: Diagnosis not present

## 2023-04-09 MED ORDER — METHOCARBAMOL 500 MG PO TABS: 500.0000 mg | ORAL_TABLET | Freq: Four times a day (QID) | ORAL | 2 refills | Status: DC | PRN

## 2023-04-09 MED ORDER — ZOLPIDEM TARTRATE 5 MG PO TABS
5.0000 mg | ORAL_TABLET | Freq: Every evening | ORAL | 1 refills | Status: DC | PRN
Start: 2023-04-09 — End: 2023-07-11

## 2023-04-09 NOTE — Progress Notes (Signed)
Patient ID: Martha Manning, female    DOB: 06/17/1956  MRN: [id number]   CC: Medical Management of Chronic Issues     Subjective: Martha Manning is a 67 y.o. female who presents for chronic ds management. 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  Took meds already today.  Limits salt  Checks BP sometimes.  Has been good except during the time her neck was bothering her.  Requests refill on Robaxin which she takes as needed for muscle spasms in the neck.  Had seen Ortho this summer for her neck.  She has osteoarthritis of the cervical spine.   On last visit  she had quit smoking.  Patient states that she has remained tobacco free except for a few slip ups.   Doing aerobic exercise 2 x wk because she is now working an extra job Does well with eating habits   Insomnia: Requesting refill on Ambien.  She has been trying not to take it every night.  However she states that she does not sleep well at all on the nights that she does not take it.  Denies any significant side effects from the medication.         Patient Active Problem List    Diagnosis Date Noted   Neck pain 12/22/2022   Former smoker 08/20/2022   Influenza vaccine refused 06/07/2020   Overweight (BMI 25.0-29.9) 06/07/2020   Obesity (BMI 30-39.9) 05/04/2019   Mixed hyperlipidemia 05/04/2019   Sinus congestion 05/04/2019   Gastroesophageal reflux disease without esophagitis 07/26/2018   Diabetes mellitus screening 12/21/2016   Status post total replacement of right hip 10/08/2015   Ganglion of left wrist 05/16/2015   Essential hypertension 03/14/2014   Osteoarthritis of right hip 03/14/2014   Smoking 03/14/2014      Medications Ordered Prior to Encounter        Current Outpatient Medications on File Prior to Visit  Medication Sig Dispense Refill   amLODipine  (NORVASC) 5 MG tablet TAKE 1 TABLET BY MOUTH ONCE DAILY . APPOINTMENT REQUIRED FOR FUTURE REFILLS 90 tablet 2   aspirin 81 MG chewable tablet Chew 81 mg by mouth daily.       fluticasone (FLONASE) 50 MCG/ACT nasal spray Place 1 spray into both nostrils daily as needed for allergies or rhinitis. 16 g 3   lisinopril (ZESTRIL) 20 MG tablet Take 1 tablet (20 mg total) by mouth daily. 90 tablet 2   loratadine (CLARITIN) 10 MG tablet Take 10 mg by mouth daily as needed for allergies or itching.       meloxicam (MOBIC) 15 MG tablet Take 1 tablet (15 mg total) by mouth daily. 30 tablet 5   Multiple Vitamin (MULTIVITAMIN WITH MINERALS) TABS tablet Take 1 tablet by mouth daily as needed (for supplementation). Reported on 12/02/2015       nicotine (NICODERM CQ - DOSED IN MG/24 HOURS) 14 mg/24hr patch Place 1 patch (14 mg total) onto the skin daily. 28 patch 1   omeprazole (PRILOSEC) 20 MG capsule Take 1 capsule by mouth once daily 30 capsule 0    No current facility-administered medications on file prior to visit.        Allergies       Allergies  Allergen Reactions   Carvedilol        Makes her  feel winded when she walks upstairs.   Clindamycin/Lincomycin        Pt states she took this antibiotic once and broke out in a rash.    Codeine     Norvasc [Amlodipine Besylate] Swelling        Social History         Socioeconomic History   Marital status: Divorced      Spouse name: Not on file   Number of children: Not on file   Years of education: Not on file   Highest education level: Not on file  Occupational History   Not on file  Tobacco Use   Smoking status: Former      Current packs/day: 0.00      Types: Cigarettes      Quit date: 06/29/2022      Years since quitting: 0.7   Smokeless tobacco: Former   Tobacco comments:      7 cigs a day   Substance and Sexual Activity   Alcohol use: Yes      Alcohol/week: 7.0 standard drinks of alcohol      Types: 7 Glasses of wine per week       Comment: 7 glasses of wine per week, one per day   Drug use: No   Sexual activity: Not Currently  Other Topics Concern   Not on file  Social History Narrative   Not on file    Social Determinants of Health        Financial Resource Strain: High Risk (04/09/2023)    Overall Financial Resource Strain (CARDIA)     Difficulty of Paying Living Expenses: Hard  Food Insecurity: No Food Insecurity (04/09/2023)    Hunger Vital Sign     Worried About Running Out of Food in the Last Year: Never true     Ran Out of Food in the Last Year: Never true  Transportation Needs: No Transportation Needs (04/09/2023)    PRAPARE - Therapist, art (Medical): No     Lack of Transportation (Non-Medical): No  Physical Activity: Insufficiently Active (04/09/2023)    Exercise Vital Sign     Days of Exercise per Week: 2 days     Minutes of Exercise per Session: 60 min  Stress: No Stress Concern Present (04/09/2023)    [employer] of Occupational Health - Occupational Stress Questionnaire     Feeling of Stress : Not at all  Social Connections: Moderately Integrated (04/09/2023)    Social Connection and Isolation Panel [NHANES]     Frequency of Communication with Friends and Family: More than three times a week     Frequency of Social Gatherings with Friends and Family: Once a week     Attends Religious Services: More than 4 times per year     Active Member of [employer] or Organizations: Yes     Attends Banker Meetings: More than 4 times per year     Marital Status: Divorced  Intimate Partner Violence: Not At Risk (04/09/2023)    Humiliation, Afraid, Rape, and Kick questionnaire     Fear of Current or Ex-Partner: No     Emotionally Abused: No     Physically Abused: No     Sexually Abused: No           Family History  Problem Relation Age of Onset   Hypertension Mother     Stroke Mother     Hypertension Sister     Colon  cancer Neg Hx     Esophageal cancer  Neg Hx     Rectal cancer Neg Hx     Stomach cancer Neg Hx     Pancreatic cancer Neg Hx     Prostate cancer Neg Hx                 Past Surgical History:  Procedure Laterality Date   DENTAL SURGERY       TOTAL HIP ARTHROPLASTY Right 10/08/2015    Procedure: RIGHT TOTAL HIP ARTHROPLASTY ANTERIOR APPROACH;  Surgeon: Kathryne Hitch, MD;  Location: [facility] OR;  Service: Orthopedics;  Laterality: Right;   VAGINAL DELIVERY        41 yrs ago          ROS: Review of Systems Negative except as stated above   PHYSICAL EXAM: BP 115/76   Pulse 75   Ht 4\' 10"  (1.473 m)   Wt 135 lb (61.2 kg)   SpO2 100%   BMI 28.22 kg/m      Wt Readings from Last 3 Encounters:  04/09/23 135 lb (61.2 kg)  08/20/22 135 lb (61.2 kg)  04/21/22 129 lb (58.5 kg)      Physical Exam   General appearance - alert, well appearing, older African-American female and in no distress Mental status - normal mood, behavior, speech, dress, motor activity, and thought processes Neck - supple, no significant adenopathy Chest - clear to auscultation, no wheezes, rales or rhonchi, symmetric air entry Heart - normal rate, regular rhythm, normal S1, S2, no murmurs, rubs, clicks or gallops Extremities - peripheral pulses normal, no pedal edema, no clubbing or cyanosis         Latest Ref Rng & Units 05/06/2022    8:45 AM 05/20/2021    3:49 PM 04/10/2021    9:07 AM  CMP  Glucose 70 - 99 mg/dL 89  87     BUN 8 - 27 mg/dL 9  11     Creatinine 2.95 - 1.00 mg/dL 6.21  3.08     Sodium 657 - 144 mmol/L 137  139     Potassium 3.5 - 5.2 mmol/L 4.1  4.4     Chloride 96 - 106 mmol/L 102  104     CO2 20 - 29 mmol/L 22  23     Calcium 8.7 - 10.3 mg/dL 84.6  96.2  95.2    84.1   Total Protein 6.0 - 8.5 g/dL 6.7  6.6  7.2   Total Bilirubin 0.0 - 1.2 mg/dL 0.3  <3.2     Alkaline Phos 44 - 121 IU/L 80  83     AST 0 - 40 IU/L 15  15     ALT 0 - 32 IU/L 12  13       Lipid Panel  Labs (Brief)          Component Value  Date/Time    CHOL 184 05/06/2022 0845    TRIG 106 05/06/2022 0845    HDL 57 05/06/2022 0845    CHOLHDL 3.2 05/06/2022 0845    CHOLHDL 3.1 03/14/2014 1102    VLDL 15 03/14/2014 1102    LDLCALC 108 (H) 05/06/2022 0845        CBC Labs (Brief)          Component Value Date/Time    WBC 8.8 05/06/2022 0845    WBC 9.9 10/10/2015 0520    RBC 4.75 05/06/2022 0845    RBC 3.28 (L) 10/10/2015  0520    HGB 12.6 05/06/2022 0845    HCT 39.4 05/06/2022 0845    PLT 361 05/06/2022 0845    MCV 83 05/06/2022 0845    MCH 26.5 (L) 05/06/2022 0845    MCH 25.0 (L) 10/10/2015 0520    MCHC 32.0 05/06/2022 0845    MCHC 31.5 10/10/2015 0520    RDW 14.6 05/06/2022 0845    LYMPHSABS CANCELED 06/07/2020 1623    MONOABS 0.9 03/14/2014 1102    EOSABS CANCELED 06/07/2020 1623    BASOSABS CANCELED 06/07/2020 1623        ASSESSMENT AND PLAN: 1. Essential hypertension At goal.  Continue lisinopril 20 mg daily and Norvasc 5 mg daily. - CBC; Future - Comprehensive metabolic panel; Future - Lipid panel; Future   2. Chronic insomnia Continue to encourage good sleep hygiene.  She will continue the Ambien as needed. - zolpidem (AMBIEN) 5 MG tablet; Take 1 tablet (5 mg total) by mouth at bedtime as needed. for sleep  Dispense: 30 tablet; Refill: 1   3. Former smoker Commended her on trying to remain free of cigarettes.   4. Overweight (BMI 25.0-29.9) Continue healthy eating habits and regular exercise.   5. Muscle spasm - methocarbamol (ROBAXIN) 500 MG tablet; Take 1 tablet (500 mg total) by mouth every 6 (six) hours as needed for muscle spasms.  Dispense: 30 tablet; Refill: 2   6. Influenza vaccination declined Patient declined flu shot.  She also declined shingles vaccine           Patient was given the opportunity to ask questions.  Patient verbalized understanding of the plan and was able to repeat key elements of the plan.    This documentation was completed using Public librarian.  Any transcriptional errors are unintentional.      Orders Placed This Encounter  Procedures   CBC   Comprehensive metabolic panel   Lipid panel        Requested Prescriptions          Signed Prescriptions Disp Refills   zolpidem (AMBIEN) 5 MG tablet 30 tablet 1      Sig: Take 1 tablet (5 mg total) by mouth at bedtime as needed. for sleep   methocarbamol (ROBAXIN) 500 MG tablet 30 tablet 2      Sig: Take 1 tablet (500 mg total) by mouth every 6 (six) hours as needed for muscle spasms.      Return in about 4 months (around 08/10/2023).   Jonah Blue, MD, FACP    Deleted by: Marcine Matar, MD at 04/09/2023  5:11 PM

## 2023-04-09 NOTE — Progress Notes (Deleted)
Patient ID: Martha Manning, female    DOB: 1955/12/29  MRN: [id number]  CC: Medical Management of Chronic Issues   Subjective: Martha Manning is a 67 y.o. female who presents for chronic ds management. Her concerns today include:  history of HTN, HL (declined statin) tob dependence, obesity, OA of right hip status post THA, insomnia   HTN: Patient is on lisinopril 20 mg daily and Norvasc 5 mg daily.  Took meds already today.  Limits salt  Checks BP sometimes.  Has been good except during the time her neck was bothering her.  Requests refill on Robaxin which she takes as needed for muscle spasms in the neck.  Had seen Ortho this summer for her neck.  She has osteoarthritis of the cervical spine.  On last visit  she had quit smoking.  Patient states that she has remained tobacco free except for a few slip ups.  Doing aerobic exercise 2 x wk because she is now working an extra job Does well with eating habits  Insomnia: Requesting refill on Ambien.  She has been trying not to take it every night.  However she states that she does not sleep well at all on the nights that she does not take it.  Denies any significant side effects from the medication.    Patient Active Problem List   Diagnosis Date Noted   Neck pain 12/22/2022   Former smoker 08/20/2022   Influenza vaccine refused 06/07/2020   Overweight (BMI 25.0-29.9) 06/07/2020   Obesity (BMI 30-39.9) 05/04/2019   Mixed hyperlipidemia 05/04/2019   Sinus congestion 05/04/2019   Gastroesophageal reflux disease without esophagitis 07/26/2018   Diabetes mellitus screening 12/21/2016   Status post total replacement of right hip 10/08/2015   Ganglion of left wrist 05/16/2015   Essential hypertension 03/14/2014   Osteoarthritis of right hip 03/14/2014   Smoking 03/14/2014     Current Outpatient Medications on File Prior to Visit  Medication Sig Dispense Refill   amLODipine (NORVASC) 5 MG tablet TAKE 1 TABLET BY MOUTH ONCE DAILY  . APPOINTMENT REQUIRED FOR FUTURE REFILLS 90 tablet 2   aspirin 81 MG chewable tablet Chew 81 mg by mouth daily.     fluticasone (FLONASE) 50 MCG/ACT nasal spray Place 1 spray into both nostrils daily as needed for allergies or rhinitis. 16 g 3   lisinopril (ZESTRIL) 20 MG tablet Take 1 tablet (20 mg total) by mouth daily. 90 tablet 2   loratadine (CLARITIN) 10 MG tablet Take 10 mg by mouth daily as needed for allergies or itching.     meloxicam (MOBIC) 15 MG tablet Take 1 tablet (15 mg total) by mouth daily. 30 tablet 5   Multiple Vitamin (MULTIVITAMIN WITH MINERALS) TABS tablet Take 1 tablet by mouth daily as needed (for supplementation). Reported on 12/02/2015     nicotine (NICODERM CQ - DOSED IN MG/24 HOURS) 14 mg/24hr patch Place 1 patch (14 mg total) onto the skin daily. 28 patch 1   omeprazole (PRILOSEC) 20 MG capsule Take 1 capsule by mouth once daily 30 capsule 0   No current facility-administered medications on file prior to visit.    Allergies  Allergen Reactions   Carvedilol     Makes her feel winded when she walks upstairs.   Clindamycin/Lincomycin     Pt states she took this antibiotic once and broke out in a rash.    Codeine    Norvasc [Amlodipine Besylate] Swelling    Social History   Socioeconomic  History   Marital status: Divorced    Spouse name: Not on file   Number of children: Not on file   Years of education: Not on file   Highest education level: Not on file  Occupational History   Not on file  Tobacco Use   Smoking status: Former    Current packs/day: 0.00    Types: Cigarettes    Quit date: 06/29/2022    Years since quitting: 0.7   Smokeless tobacco: Former   Tobacco comments:    7 cigs a day   Substance and Sexual Activity   Alcohol use: Yes    Alcohol/week: 7.0 standard drinks of alcohol    Types: 7 Glasses of wine per week    Comment: 7 glasses of wine per week, one per day   Drug use: No   Sexual activity: Not Currently  Other Topics Concern    Not on file  Social History Narrative   Not on file   Social Determinants of Health   Financial Resource Strain: High Risk (04/09/2023)   Overall Financial Resource Strain (CARDIA)    Difficulty of Paying Living Expenses: Hard  Food Insecurity: No Food Insecurity (04/09/2023)   Hunger Vital Sign    Worried About Running Out of Food in the Last Year: Never true    Ran Out of Food in the Last Year: Never true  Transportation Needs: No Transportation Needs (04/09/2023)   PRAPARE - Administrator, Civil Service (Medical): No    Lack of Transportation (Non-Medical): No  Physical Activity: Insufficiently Active (04/09/2023)   Exercise Vital Sign    Days of Exercise per Week: 2 days    Minutes of Exercise per Session: 60 min  Stress: No Stress Concern Present (04/09/2023)   [employer] of Occupational Health - Occupational Stress Questionnaire    Feeling of Stress : Not at all  Social Connections: Moderately Integrated (04/09/2023)   Social Connection and Isolation Panel [NHANES]    Frequency of Communication with Friends and Family: More than three times a week    Frequency of Social Gatherings with Friends and Family: Once a week    Attends Religious Services: More than 4 times per year    Active Member of Clubs or Organizations: Yes    Attends Banker Meetings: More than 4 times per year    Marital Status: Divorced  Intimate Partner Violence: Not At Risk (04/09/2023)   Humiliation, Afraid, Rape, and Kick questionnaire    Fear of Current or Ex-Partner: No    Emotionally Abused: No    Physically Abused: No    Sexually Abused: No    Family History  Problem Relation Age of Onset   Hypertension Mother    Stroke Mother    Hypertension Sister    Colon cancer Neg Hx    Esophageal cancer Neg Hx    Rectal cancer Neg Hx    Stomach cancer Neg Hx    Pancreatic cancer Neg Hx    Prostate cancer Neg Hx     Past Surgical History:  Procedure Laterality  Date   DENTAL SURGERY     TOTAL HIP ARTHROPLASTY Right 10/08/2015   Procedure: RIGHT TOTAL HIP ARTHROPLASTY ANTERIOR APPROACH;  Surgeon: Kathryne Hitch, MD;  Location: [facility] OR;  Service: Orthopedics;  Laterality: Right;   VAGINAL DELIVERY     41 yrs ago    ROS: Review of Systems Negative except as stated above  PHYSICAL EXAM: BP 115/76  Pulse 75   Ht 4\' 10"  (1.473 m)   Wt 135 lb (61.2 kg)   SpO2 100%   BMI 28.22 kg/m   Wt Readings from Last 3 Encounters:  04/09/23 135 lb (61.2 kg)  08/20/22 135 lb (61.2 kg)  04/21/22 129 lb (58.5 kg)    Physical Exam  General appearance - alert, well appearing, older African-American female and in no distress Mental status - normal mood, behavior, speech, dress, motor activity, and thought processes Neck - supple, no significant adenopathy Chest - clear to auscultation, no wheezes, rales or rhonchi, symmetric air entry Heart - normal rate, regular rhythm, normal S1, S2, no murmurs, rubs, clicks or gallops Extremities - peripheral pulses normal, no pedal edema, no clubbing or cyanosis      Latest Ref Rng & Units 05/06/2022    8:45 AM 05/20/2021    3:49 PM 04/10/2021    9:07 AM  CMP  Glucose 70 - 99 mg/dL 89  87    BUN 8 - 27 mg/dL 9  11    Creatinine 4.09 - 1.00 mg/dL 8.11  9.14    Sodium 782 - 144 mmol/L 137  139    Potassium 3.5 - 5.2 mmol/L 4.1  4.4    Chloride 96 - 106 mmol/L 102  104    CO2 20 - 29 mmol/L 22  23    Calcium 8.7 - 10.3 mg/dL 95.6  21.3  08.6    57.8   Total Protein 6.0 - 8.5 g/dL 6.7  6.6  7.2   Total Bilirubin 0.0 - 1.2 mg/dL 0.3  <4.6    Alkaline Phos 44 - 121 IU/L 80  83    AST 0 - 40 IU/L 15  15    ALT 0 - 32 IU/L 12  13     Lipid Panel     Component Value Date/Time   CHOL 184 05/06/2022 0845   TRIG 106 05/06/2022 0845   HDL 57 05/06/2022 0845   CHOLHDL 3.2 05/06/2022 0845   CHOLHDL 3.1 03/14/2014 1102   VLDL 15 03/14/2014 1102   LDLCALC 108 (H) 05/06/2022 0845    CBC    Component  Value Date/Time   WBC 8.8 05/06/2022 0845   WBC 9.9 10/10/2015 0520   RBC 4.75 05/06/2022 0845   RBC 3.28 (L) 10/10/2015 0520   HGB 12.6 05/06/2022 0845   HCT 39.4 05/06/2022 0845   PLT 361 05/06/2022 0845   MCV 83 05/06/2022 0845   MCH 26.5 (L) 05/06/2022 0845   MCH 25.0 (L) 10/10/2015 0520   MCHC 32.0 05/06/2022 0845   MCHC 31.5 10/10/2015 0520   RDW 14.6 05/06/2022 0845   LYMPHSABS CANCELED 06/07/2020 1623   MONOABS 0.9 03/14/2014 1102   EOSABS CANCELED 06/07/2020 1623   BASOSABS CANCELED 06/07/2020 1623    ASSESSMENT AND PLAN: 1. Essential hypertension At goal.  Continue lisinopril 20 mg daily and Norvasc 5 mg daily. - CBC; Future - Comprehensive metabolic panel; Future - Lipid panel; Future  2. Chronic insomnia Continue to encourage good sleep hygiene.  She will continue the Ambien as needed. - zolpidem (AMBIEN) 5 MG tablet; Take 1 tablet (5 mg total) by mouth at bedtime as needed. for sleep  Dispense: 30 tablet; Refill: 1  3. Former smoker Commended her on trying to remain free of cigarettes.  4. Overweight (BMI 25.0-29.9) Continue healthy eating habits and regular exercise.  5. Muscle spasm - methocarbamol (ROBAXIN) 500 MG tablet; Take 1 tablet (500 mg total)  by mouth every 6 (six) hours as needed for muscle spasms.  Dispense: 30 tablet; Refill: 2  6. Influenza vaccination declined Patient declined flu shot.  She also declined shingles vaccine      Patient was given the opportunity to ask questions.  Patient verbalized understanding of the plan and was able to repeat key elements of the plan.   This documentation was completed using Paediatric nurse.  Any transcriptional errors are unintentional.  Orders Placed This Encounter  Procedures   CBC   Comprehensive metabolic panel   Lipid panel     Requested Prescriptions   Signed Prescriptions Disp Refills   zolpidem (AMBIEN) 5 MG tablet 30 tablet 1    Sig: Take 1 tablet (5 mg total)  by mouth at bedtime as needed. for sleep   methocarbamol (ROBAXIN) 500 MG tablet 30 tablet 2    Sig: Take 1 tablet (500 mg total) by mouth every 6 (six) hours as needed for muscle spasms.    Return in about 4 months (around 08/10/2023).  Jonah Blue, MD, FACP

## 2023-04-22 ENCOUNTER — Ambulatory Visit: Payer: BC Managed Care – PPO | Attending: Internal Medicine

## 2023-04-22 ENCOUNTER — Other Ambulatory Visit: Payer: Self-pay

## 2023-04-22 DIAGNOSIS — I10 Essential (primary) hypertension: Secondary | ICD-10-CM

## 2023-04-23 LAB — CBC
Hematocrit: 38.9 % (ref 34.0–46.6)
Hemoglobin: 12 g/dL (ref 11.1–15.9)
MCH: 26 pg — ABNORMAL LOW (ref 26.6–33.0)
MCHC: 30.8 g/dL — ABNORMAL LOW (ref 31.5–35.7)
MCV: 84 fL (ref 79–97)
Platelets: 385 10*3/uL (ref 150–450)
RBC: 4.62 x10E6/uL (ref 3.77–5.28)
RDW: 14.7 % (ref 11.7–15.4)
WBC: 7.6 10*3/uL (ref 3.4–10.8)

## 2023-04-23 LAB — COMPREHENSIVE METABOLIC PANEL
ALT: 13 [IU]/L (ref 0–32)
AST: 18 [IU]/L (ref 0–40)
Albumin: 4.4 g/dL (ref 3.9–4.9)
Alkaline Phosphatase: 75 [IU]/L (ref 44–121)
BUN/Creatinine Ratio: 12 (ref 12–28)
BUN: 8 mg/dL (ref 8–27)
Bilirubin Total: 0.3 mg/dL (ref 0.0–1.2)
CO2: 23 mmol/L (ref 20–29)
Calcium: 10.1 mg/dL (ref 8.7–10.3)
Chloride: 105 mmol/L (ref 96–106)
Creatinine, Ser: 0.69 mg/dL (ref 0.57–1.00)
Globulin, Total: 2.4 g/dL (ref 1.5–4.5)
Glucose: 82 mg/dL (ref 70–99)
Potassium: 4.3 mmol/L (ref 3.5–5.2)
Sodium: 142 mmol/L (ref 134–144)
Total Protein: 6.8 g/dL (ref 6.0–8.5)
eGFR: 96 mL/min/{1.73_m2} (ref >59)

## 2023-04-23 LAB — LIPID PANEL
Chol/HDL Ratio: 3 ratio (ref 0.0–4.4)
Cholesterol, Total: 208 mg/dL — ABNORMAL HIGH (ref 100–199)
HDL: 69 mg/dL (ref >39)
LDL Chol Calc (NIH): 120 mg/dL — ABNORMAL HIGH (ref 0–99)
Triglycerides: 106 mg/dL (ref 0–149)
VLDL Cholesterol Cal: 19 mg/dL (ref 5–40)

## 2023-04-26 ENCOUNTER — Telehealth: Payer: Self-pay | Admitting: Internal Medicine

## 2023-04-26 NOTE — Telephone Encounter (Signed)
Pt is calling to ask does she has time to change her diet before Dr. Laural Benes puts her medication to lower cholesterol . Please advise

## 2023-04-28 NOTE — Telephone Encounter (Signed)
Message from patient per lab results note.     Johna Roles, CMA 04/23/2023  1:09 PM EDT Back to Top    Called & spoke to the patient. Verified name & DOB. Informed of results & recommendations. Patient declined to start atorvastatin and declined a referral to a nutritionist. Ms.Ardila stated that she will start eating healthy. She is also inquiring if fish oil will help lower cholesterol. Please advise.

## 2023-06-04 ENCOUNTER — Other Ambulatory Visit: Payer: Self-pay | Admitting: Internal Medicine

## 2023-06-04 DIAGNOSIS — I10 Essential (primary) hypertension: Secondary | ICD-10-CM

## 2023-07-09 ENCOUNTER — Other Ambulatory Visit: Payer: Self-pay | Admitting: Internal Medicine

## 2023-07-09 DIAGNOSIS — F5104 Psychophysiologic insomnia: Secondary | ICD-10-CM

## 2023-08-16 ENCOUNTER — Other Ambulatory Visit: Payer: Self-pay | Admitting: Internal Medicine

## 2023-08-16 ENCOUNTER — Telehealth: Payer: Self-pay

## 2023-08-16 DIAGNOSIS — M62838 Other muscle spasm: Secondary | ICD-10-CM

## 2023-08-16 DIAGNOSIS — I10 Essential (primary) hypertension: Secondary | ICD-10-CM

## 2023-08-16 MED ORDER — AMLODIPINE BESYLATE 5 MG PO TABS
ORAL_TABLET | ORAL | 1 refills | Status: DC
Start: 2023-08-16 — End: 2023-12-23

## 2023-08-16 MED ORDER — METHOCARBAMOL 500 MG PO TABS: 500.0000 mg | ORAL_TABLET | Freq: Four times a day (QID) | ORAL | 2 refills | Status: DC | PRN

## 2023-08-16 MED ORDER — LISINOPRIL 20 MG PO TABS: 20.0000 mg | ORAL_TABLET | Freq: Every day | ORAL | 1 refills | Status: DC

## 2023-08-16 NOTE — Telephone Encounter (Signed)
 Copied from CRM [id number]. Topic: Clinical - Prescription Issue >> Aug 16, 2023  2:33 PM Geroge Baseman wrote: Reason for CRM: zolpidem (AMBIEN) 5 MG tablet, amLODipine (NORVASC) 5 MG tablet, methocarbamol (ROBAXIN) 500 MG tablet, lisinopril (ZESTRIL) 20 MG tablet. Patient needs these ASAP, said she ordered them through the pharmacy but they don't have the refills yet.. States she has been out of the high blood pressure medication since the weekend.

## 2023-08-16 NOTE — Telephone Encounter (Signed)
 Refill sent on medications.  Prescription for Ambien was sent 07/11/2023 for 30 tablets with 1 additional refills.  She is not due for refill on this 1 as yet.

## 2023-08-16 NOTE — Addendum Note (Signed)
 Addended by: Jonah Blue B on: 08/16/2023 10:25 PM   Modules accepted: Orders

## 2023-08-17 NOTE — Telephone Encounter (Signed)
 Call to patient unable to reach . VM left to advise medication sent to her pharmacy.

## 2023-08-23 ENCOUNTER — Other Ambulatory Visit: Payer: BC Managed Care – PPO

## 2023-08-23 ENCOUNTER — Ambulatory Visit: Payer: BC Managed Care – PPO

## 2023-08-27 ENCOUNTER — Encounter: Payer: Self-pay | Admitting: Internal Medicine

## 2023-08-27 ENCOUNTER — Ambulatory Visit: Payer: BC Managed Care – PPO | Attending: Internal Medicine | Admitting: Internal Medicine

## 2023-08-27 VITALS — BP 122/78 | HR 88 | Temp 97.9°F | Ht <= 58 in | Wt 126.0 lb

## 2023-08-27 DIAGNOSIS — I10 Essential (primary) hypertension: Secondary | ICD-10-CM | POA: Diagnosis not present

## 2023-08-27 DIAGNOSIS — J302 Other seasonal allergic rhinitis: Secondary | ICD-10-CM | POA: Diagnosis not present

## 2023-08-27 DIAGNOSIS — E782 Mixed hyperlipidemia: Secondary | ICD-10-CM | POA: Diagnosis not present

## 2023-08-27 DIAGNOSIS — M542 Cervicalgia: Secondary | ICD-10-CM

## 2023-08-27 DIAGNOSIS — F5104 Psychophysiologic insomnia: Secondary | ICD-10-CM | POA: Diagnosis not present

## 2023-08-27 MED ORDER — MELOXICAM 15 MG PO TABS
15.0000 mg | ORAL_TABLET | Freq: Every day | ORAL | 5 refills | Status: AC
Start: 2023-08-27 — End: ?

## 2023-08-27 MED ORDER — ZOLPIDEM TARTRATE 5 MG PO TABS: 5.0000 mg | ORAL_TABLET | Freq: Every evening | ORAL | 1 refills | Status: DC | PRN

## 2023-08-27 NOTE — Progress Notes (Signed)
 Patient ID: Martha Manning, female    DOB: [birth year]  MRN: [id number]  CC: Hypertension (HTN f/u. Franchot Erichsen Ambien -unable to obtain pt is unsure why/)   Subjective: Martha Manning is a 68 y.o. female who presents for chronic ds management. Her concerns today include:  history of HTN, HL (declined statin) tob dependence, obesity, OA of right hip status post THA, insomnia   Discussed the use of AI scribe software for clinical note transcription with the patient, who gave verbal consent to proceed.  History of Present Illness   The patient, with a history of hypertension and chronic insomnia, presents for chronic disease management.  Chronic insomnia: Reports difficulty in getting refill on Ambien earlier this month.  States she was told by the pharmacy that they were waiting for reply from me.  Last Ambien prescription was sent in 07/11/2023 for 30 pills and 1 additional refills.  I can see that he had the prescription on 13 January with no problems.  However when she went to get it filled again this month, she was told by the pharmacy that they were waiting for reply from me even though she still had an additional refill.    In addition to her prescription issues, the patient reports feeling overworked due to working a full-time job 50-55 hrs/wk and a part-time job 5 hrs several times a wk during the [employer], leading to physical exhaustion and frequent colds.  This morning she has had some nasal congestion with sneezing.  She also reports a flareup of arthritis pain in her neck during the cold weather.  She has been taking Advil, Goody Powders, Aleve, and Tylenol for the pain, and has been using Flonase nasal spray for her sinus issues.  Weight loss: The patient also reports making significant dietary changes since October, including reducing her intake of bacon, butter, fried fish, and red meat. She has lost nine pounds as a result. She reports being on her feet and moving all  day at her job.     HTN: Reports compliance with taking amlodipine 10 mg daily and lisinopril 20 mg daily.  Blood pressure today on initial check was mildly elevated.  She attributes the elevation to overworking herself during the holidays.  HL: She would like to have her cholesterol rechecked today.  She has declined statin therapy in the past.  Patient Active Problem List   Diagnosis Date Noted   Neck pain 12/22/2022   Former smoker 08/20/2022   Influenza vaccine refused 06/07/2020   Overweight (BMI 25.0-29.9) 06/07/2020   Obesity (BMI 30-39.9) 05/04/2019   Mixed hyperlipidemia 05/04/2019   Sinus congestion 05/04/2019   Gastroesophageal reflux disease without esophagitis 07/26/2018   Diabetes mellitus screening 12/21/2016   Status post total replacement of right hip 10/08/2015   Ganglion of left wrist 05/16/2015   Essential hypertension 03/14/2014   Osteoarthritis of right hip 03/14/2014   Smoking 03/14/2014     Current Outpatient Medications on File Prior to Visit  Medication Sig Dispense Refill   amLODipine (NORVASC) 5 MG tablet TAKE 1 TABLET BY MOUTH ONCE DAILY . APPOINTMENT REQUIRED FOR FUTURE REFILLS 90 tablet 1   aspirin 81 MG chewable tablet Chew 81 mg by mouth daily.     fluticasone (FLONASE) 50 MCG/ACT nasal spray Place 1 spray into both nostrils daily as needed for allergies or rhinitis. 16 g 3   lisinopril (ZESTRIL) 20 MG tablet Take 1 tablet (20 mg total) by mouth daily. 90 tablet 1  loratadine (CLARITIN) 10 MG tablet Take 10 mg by mouth daily as needed for allergies or itching.     methocarbamol (ROBAXIN) 500 MG tablet Take 1 tablet (500 mg total) by mouth every 6 (six) hours as needed for muscle spasms. 30 tablet 2   Multiple Vitamin (MULTIVITAMIN WITH MINERALS) TABS tablet Take 1 tablet by mouth daily as needed (for supplementation). Reported on 12/02/2015     nicotine (NICODERM CQ - DOSED IN MG/24 HOURS) 14 mg/24hr patch Place 1 patch (14 mg total) onto the skin  daily. 28 patch 1   omeprazole (PRILOSEC) 20 MG capsule Take 1 capsule by mouth once daily 30 capsule 0   No current facility-administered medications on file prior to visit.    Allergies  Allergen Reactions   Carvedilol     Makes her feel winded when she walks upstairs.   Clindamycin/Lincomycin     Pt states she took this antibiotic once and broke out in a rash.    Codeine    Norvasc [Amlodipine Besylate] Swelling    Social History   Socioeconomic History   Marital status: Divorced    Spouse name: Not on file   Number of children: Not on file   Years of education: Not on file   Highest education level: Not on file  Occupational History   Not on file  Tobacco Use   Smoking status: Former    Current packs/day: 0.00    Types: Cigarettes    Quit date: 06/29/2022    Years since quitting: 1.1   Smokeless tobacco: Former   Tobacco comments:    7 cigs a day   Substance and Sexual Activity   Alcohol use: Yes    Alcohol/week: 7.0 standard drinks of alcohol    Types: 7 Glasses of wine per week    Comment: 7 glasses of wine per week, one per day   Drug use: No   Sexual activity: Not Currently  Other Topics Concern   Not on file  Social History Narrative   Not on file   Social Drivers of Health   Financial Resource Strain: High Risk (04/09/2023)   Overall Financial Resource Strain (CARDIA)    Difficulty of Paying Living Expenses: Hard  Food Insecurity: No Food Insecurity (04/09/2023)   Hunger Vital Sign    Worried About Running Out of Food in the Last Year: Never true    Ran Out of Food in the Last Year: Never true  Transportation Needs: No Transportation Needs (04/09/2023)   PRAPARE - Administrator, Civil Service (Medical): No    Lack of Transportation (Non-Medical): No  Physical Activity: Insufficiently Active (04/09/2023)   Exercise Vital Sign    Days of Exercise per Week: 2 days    Minutes of Exercise per Session: 60 min  Stress: No Stress Concern  Present (04/09/2023)   [employer] of Occupational Health - Occupational Stress Questionnaire    Feeling of Stress : Not at all  Social Connections: Moderately Integrated (04/09/2023)   Social Connection and Isolation Panel [NHANES]    Frequency of Communication with Friends and Family: More than three times a week    Frequency of Social Gatherings with Friends and Family: Once a week    Attends Religious Services: More than 4 times per year    Active Member of [employer] or Organizations: Yes    Attends Engineer, structural: More than 4 times per year    Marital Status: Divorced  Catering manager Violence:  Not At Risk (04/09/2023)   Humiliation, Afraid, Rape, and Kick questionnaire    Fear of Current or Ex-Partner: No    Emotionally Abused: No    Physically Abused: No    Sexually Abused: No    Family History  Problem Relation Age of Onset   Hypertension Mother    Stroke Mother    Hypertension Sister    Colon cancer Neg Hx    Esophageal cancer Neg Hx    Rectal cancer Neg Hx    Stomach cancer Neg Hx    Pancreatic cancer Neg Hx    Prostate cancer Neg Hx     Past Surgical History:  Procedure Laterality Date   DENTAL SURGERY     TOTAL HIP ARTHROPLASTY Right 10/08/2015   Procedure: RIGHT TOTAL HIP ARTHROPLASTY ANTERIOR APPROACH;  Surgeon: Kathryne Hitch, MD;  Location: [facility] OR;  Service: Orthopedics;  Laterality: Right;   VAGINAL DELIVERY     41 yrs ago    ROS: Review of Systems Negative except as stated above  PHYSICAL EXAM: BP 122/78   Pulse 88   Temp 97.9 F (36.6 C) (Oral)   Ht 4\' 10"  (1.473 m)   Wt 126 lb (57.2 kg)   SpO2 99%   BMI 26.33 kg/m   Wt Readings from Last 3 Encounters:  08/27/23 126 lb (57.2 kg)  04/09/23 135 lb (61.2 kg)  08/20/22 135 lb (61.2 kg)    Physical Exam  General appearance - alert, well appearing, and in no distress Mental status - normal mood, behavior, speech, dress, motor activity, and thought processes Nose -  normal and patent, no erythema, discharge or polyps Mouth - mucous membranes moist, pharynx normal without lesions Neck - supple, no significant adenopathy Chest - clear to auscultation, no wheezes, rales or rhonchi, symmetric air entry Heart - normal rate, regular rhythm, normal S1, S2, no murmurs, rubs, clicks or gallops Extremities - peripheral pulses normal, no pedal edema, no clubbing or cyanosis      Latest Ref Rng & Units 04/22/2023    9:21 AM 05/06/2022    8:45 AM 05/20/2021    3:49 PM  CMP  Glucose 70 - 99 mg/dL 82  89  87   BUN 8 - 27 mg/dL 8  9  11    Creatinine 0.57 - 1.00 mg/dL 8.29  5.62  1.30   Sodium 134 - 144 mmol/L 142  137  139   Potassium 3.5 - 5.2 mmol/L 4.3  4.1  4.4   Chloride 96 - 106 mmol/L 105  102  104   CO2 20 - 29 mmol/L 23  22  23    Calcium 8.7 - 10.3 mg/dL 86.5  78.4  69.6   Total Protein 6.0 - 8.5 g/dL 6.8  6.7  6.6   Total Bilirubin 0.0 - 1.2 mg/dL 0.3  0.3  <2.9   Alkaline Phos 44 - 121 IU/L 75  80  83   AST 0 - 40 IU/L 18  15  15    ALT 0 - 32 IU/L 13  12  13     Lipid Panel     Component Value Date/Time   CHOL 208 (H) 04/22/2023 0921   TRIG 106 04/22/2023 0921   HDL 69 04/22/2023 0921   CHOLHDL 3.0 04/22/2023 0921   CHOLHDL 3.1 03/14/2014 1102   VLDL 15 03/14/2014 1102   LDLCALC 120 (H) 04/22/2023 0921    CBC    Component Value Date/Time   WBC 7.6 04/22/2023 0921  WBC 9.9 10/10/2015 0520   RBC 4.62 04/22/2023 0921   RBC 3.28 (L) 10/10/2015 0520   HGB 12.0 04/22/2023 0921   HCT 38.9 04/22/2023 0921   PLT 385 04/22/2023 0921   MCV 84 04/22/2023 0921   MCH 26.0 (L) 04/22/2023 0921   MCH 25.0 (L) 10/10/2015 0520   MCHC 30.8 (L) 04/22/2023 0921   MCHC 31.5 10/10/2015 0520   RDW 14.7 04/22/2023 0921   LYMPHSABS CANCELED 06/07/2020 1623   MONOABS 0.9 03/14/2014 1102   EOSABS CANCELED 06/07/2020 1623   BASOSABS CANCELED 06/07/2020 1623    ASSESSMENT AND PLAN: 1. Essential hypertension (Primary) At goal.  Continue lisinopril 20  mg daily and amlodipine 10 mg daily. Advised against working excessive hours to prevent exhaustion.  The body needs time to relax and recuperate.  2. Mixed hyperlipidemia Commended her on weight loss.  Encouraged her to continue healthy eating habits and moving as much as she can. - Lipid panel  3. Chronic insomnia Resend prescription for Ambien 30 tablets with an additional refill. -Advise patient to check with pharmacy if prior approval is needed from insurance. - zolpidem (AMBIEN) 5 MG tablet; Take 1 tablet (5 mg total) by mouth at bedtime as needed. for sleep  Dispense: 30 tablet; Refill: 1  4. Seasonal allergies Continue Flonase.  Purchase Claritin or Allegra over-the-counter and use as needed.  5. Neck pain Advised to stop BC's and Advil.  Refill given on meloxicam. - meloxicam (MOBIC) 15 MG tablet; Take 1 tablet (15 mg total) by mouth daily.  Dispense: 30 tablet; Refill: 5   Patient was given the opportunity to ask questions.  Patient verbalized understanding of the plan and was able to repeat key elements of the plan.   This documentation was completed using Paediatric nurse.  Any transcriptional errors are unintentional.  Orders Placed This Encounter  Procedures   Lipid panel     Requested Prescriptions   Signed Prescriptions Disp Refills   zolpidem (AMBIEN) 5 MG tablet 30 tablet 1    Sig: Take 1 tablet (5 mg total) by mouth at bedtime as needed. for sleep   meloxicam (MOBIC) 15 MG tablet 30 tablet 5    Sig: Take 1 tablet (15 mg total) by mouth daily.    Return in about 4 months (around 12/25/2023).  Jonah Blue, MD, FACP

## 2023-08-28 LAB — LIPID PANEL
Chol/HDL Ratio: 2.8 ratio (ref 0.0–4.4)
Cholesterol, Total: 177 mg/dL (ref 100–199)
HDL: 64 mg/dL (ref >39)
LDL Chol Calc (NIH): 92 mg/dL (ref 0–99)
Triglycerides: 117 mg/dL (ref 0–149)
VLDL Cholesterol Cal: 21 mg/dL (ref 5–40)

## 2023-09-30 ENCOUNTER — Other Ambulatory Visit: Payer: Self-pay | Admitting: Internal Medicine

## 2023-09-30 DIAGNOSIS — Z1231 Encounter for screening mammogram for malignant neoplasm of breast: Secondary | ICD-10-CM

## 2023-10-06 ENCOUNTER — Ambulatory Visit
Admission: RE | Admit: 2023-10-06 | Discharge: 2023-10-06 | Disposition: A | Discharge status: Home / Self Care | Source: Ambulatory Visit | Attending: Internal Medicine | Admitting: Internal Medicine

## 2023-10-06 DIAGNOSIS — Z1231 Encounter for screening mammogram for malignant neoplasm of breast: Secondary | ICD-10-CM

## 2023-11-29 ENCOUNTER — Ambulatory Visit: Payer: Self-pay

## 2023-11-29 NOTE — Telephone Encounter (Signed)
  Chief Complaint: back pain Symptoms: back pain, neck pain, shoulder pain, constant, 9/10 Frequency: last month been worse, ongoing 1 year  Pertinent Negatives: Patient states no medication she has is helping with pain  Disposition: [] ED /[] Urgent Care (no appt availability in office) / [] Appointment(In office/virtual)/ []  [facility]/ [] Home Care/ [] Refused Recommended Disposition /[] [facility] [facility]/ [x]  Follow-up with PCP Additional Notes: pt has upcoming appt with PCP 12/23/23 but pt is asking if anything can be prescribed different for pain. She is taking Robaxin  and Meloxicam  and Ibuprofen  and nothing helps with pain. Pt states she is in process of doing FMLA papers but wanting to see if something can help her before appt. No sooner appts available. Advised pt I would send message back and have office FU with her.   Copied from CRM [id number]. Topic: Clinical - Red Word Triage >> Nov 29, 2023 11:03 AM Zipporah Him wrote: Red Word that prompted transfer to Nurse Triage: Patient is having severe pain, throbbing and irritation. States quality of life is not good right now. Pain in the shoulders, neck, and spine >> Nov 29, 2023 11:05 AM Zipporah Him wrote: Note" Forgot to add, due to arthritis pain and inflammation, states constant pain;. Reason for Disposition  [1] SEVERE back pain (e.g., excruciating, unable to do any normal activities) AND [2] not improved 2 hours after pain medicine  Answer Assessment - Initial Assessment Questions 1. ONSET: "When did the pain begin?"      Last year, last month worsening  2. LOCATION: "Where does it hurt?" (upper, mid or lower back)     Neck, back, shoulders 3. SEVERITY: "How bad is the pain?"  (e.g., Scale 1-10; mild, moderate, or severe)   - MILD (1-3): Doesn't interfere with normal activities.    - MODERATE (4-7): Interferes with normal activities or awakens from sleep.    - SEVERE (8-10): Excruciating pain, unable to do any normal activities.       9/10 4. PATTERN: "Is the pain constant?" (e.g., yes, no; constant, intermittent)      Constant  6. CAUSE:  "What do you think is causing the back pain?"      Arthritis  8. MEDICINES: "What have you taken so far for the pain?" (e.g., nothing, acetaminophen , NSAIDS)     Ibuprofen   10. OTHER SYMPTOMS: "Do you have any other symptoms?" (e.g., fever, abdomen pain, burning with urination, blood in urine)       Muscle spasms  Protocols used: Back Pain-A-AH

## 2023-11-29 NOTE — Telephone Encounter (Signed)
 Patient has an appointment scheduled for 6/26.  Advised to f/u with [facility] or [facility] if needing sooner appt.   Sent this information to her email.

## 2023-12-02 ENCOUNTER — Encounter: Payer: Self-pay | Admitting: Physician Assistant

## 2023-12-02 ENCOUNTER — Ambulatory Visit: Admitting: Physician Assistant

## 2023-12-02 VITALS — BP 126/86 | HR 104 | Ht 58.5 in | Wt 124.4 lb

## 2023-12-02 DIAGNOSIS — M542 Cervicalgia: Secondary | ICD-10-CM | POA: Diagnosis not present

## 2023-12-02 MED ORDER — METHYLPREDNISOLONE ACETATE 80 MG/ML IJ SUSP
80.0000 mg | Freq: Once | INTRAMUSCULAR | Status: AC
  Administered 2023-12-02: 80 mg via INTRAMUSCULAR

## 2023-12-02 MED ORDER — KETOROLAC TROMETHAMINE 60 MG/2ML IM SOLN
60.0000 mg | Freq: Once | INTRAMUSCULAR | Status: AC
  Administered 2023-12-02: 60 mg via INTRAMUSCULAR

## 2023-12-02 NOTE — Progress Notes (Signed)
 Established Patient Office Visit  Subjective   Patient ID: Martha Manning, female    DOB: Jan 23, 1956  Age: 68 y.o. MRN: [id number]  Chief Complaint  Patient presents with   Arthritis    Patient presents with pain in upper spine, that radiates down to shoulder. Patient has tried OTC pain relieving alternatives, such as goody powders, and ibuprofen  that has gave some relief, but has been upsetting her stomach. She states the consistent pain has become hard to manage, it is affecting her quality of life. Being in consistent pain has caused short temper.   Discussed the use of AI scribe software for clinical note transcription with the patient, who gave verbal consent to proceed.  History of Present Illness  States that she has been having worsening neck pain and feels that it is affecting her quality of life.  States that she has been taking meloxicam  OR robaxin  without much relief.  States that she did not realize she could take them both at the same time.  States that she has also tried Tylenol , Aleve and goody powders without relief.  She was last seen by orthopedics in 11/2022.  Plan from that note:  Assessment & Plan: Visit Diagnoses:  1. Pain in neck   2. Neck pain       Plan: Patient comes in today complaining of about a year history of neck pain.  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  She is not sure if this helped.  She gives a remote history of neck issues which was treated via chiropractic care many years ago.  X-rays today show straightening of the normal lordotic curve and she has significant degenerative changes of the C-spine.  Fortunately she is completely neurologically intact and just has pain with flexion of her neck and along the right side of her neck into the top of her shoulder.  No radiculopathy at all.  Her strength is intact.  She does take Mobic  when she needs it.  We discussed possibly taking  Mobic  on a regular basis for a couple weeks also go discussed some topical Voltaren gel.  If she has any progression of her symptoms she is to contact me immediately.  She had gotten an IM injection of steroid for her lower back about a year ago when she saw Gill.  She had good response to it we can go forward with that today as well.  We discussed physical therapy she declines that at this time.  Certainly if she had ongoing symptoms would recommend an MRI   HPI  Past Medical History:  Diagnosis Date   Allergy    Arthritis    "right hip" (10/08/2015)   GERD (gastroesophageal reflux disease)    depends on diet    Hypertension    Social History   Socioeconomic History   Marital status: Divorced    Spouse name: Not on file   Number of children: Not on file   Years of education: Not on file   Highest education level: Not on file  Occupational History   Not on file  Tobacco Use   Smoking status: Former    Current packs/day: 0.00    Types: Cigarettes    Quit date: 06/29/2022    Years since quitting: 1.4   Smokeless tobacco: Former   Tobacco comments:    7 cigs a day   Substance and Sexual Activity   Alcohol  use: Yes    Alcohol/week: 7.0 standard drinks of alcohol    Types: 7 Glasses of wine per week    Comment: 7 glasses of wine per week, one per day   Drug use: No   Sexual activity: Not Currently  Other Topics Concern   Not on file  Social History Narrative   Not on file   Social Drivers of Health   Financial Resource Strain: High Risk (04/09/2023)   Overall Financial Resource Strain (CARDIA)    Difficulty of Paying Living Expenses: Hard  Food Insecurity: No Food Insecurity (04/09/2023)   Hunger Vital Sign    Worried About Running Out of Food in the Last Year: Never true    Ran Out of Food in the Last Year: Never true  Transportation Needs: No Transportation Needs (04/09/2023)   PRAPARE - Administrator, Civil Service (Medical): No    Lack of  Transportation (Non-Medical): No  Physical Activity: Insufficiently Active (04/09/2023)   Exercise Vital Sign    Days of Exercise per Week: 2 days    Minutes of Exercise per Session: 60 min  Stress: No Stress Concern Present (04/09/2023)   [employer] of Occupational Health - Occupational Stress Questionnaire    Feeling of Stress : Not at all  Social Connections: Moderately Integrated (04/09/2023)   Social Connection and Isolation Panel [NHANES]    Frequency of Communication with Friends and Family: More than three times a week    Frequency of Social Gatherings with Friends and Family: Once a week    Attends Religious Services: More than 4 times per year    Active Member of [employer] or Organizations: Yes    Attends Engineer, structural: More than 4 times per year    Marital Status: Divorced  Intimate Partner Violence: Not At Risk (04/09/2023)   Humiliation, Afraid, Rape, and Kick questionnaire    Fear of Current or Ex-Partner: No    Emotionally Abused: No    Physically Abused: No    Sexually Abused: No   Family History  Problem Relation Age of Onset   Hypertension Mother    Stroke Mother    Hypertension Sister    Colon cancer Neg Hx    Esophageal cancer Neg Hx    Rectal cancer Neg Hx    Stomach cancer Neg Hx    Pancreatic cancer Neg Hx    Prostate cancer Neg Hx    Allergies  Allergen Reactions   Carvedilol      Makes her feel winded when she walks upstairs.   Clindamycin/Lincomycin     Pt states she took this antibiotic once and broke out in a rash.    Codeine    Norvasc  [Amlodipine  Besylate] Swelling    Review of Systems  Constitutional: Negative.   HENT: Negative.    Eyes: Negative.   Respiratory:  Negative for shortness of breath.   Cardiovascular:  Negative for chest pain.  Gastrointestinal: Negative.   Genitourinary: Negative.   Musculoskeletal:  Positive for myalgias and neck pain.  Skin: Negative.   Neurological: Negative.    Endo/Heme/Allergies: Negative.   Psychiatric/Behavioral: Negative.        Objective:     BP 126/86 (BP Location: Left Arm, Patient Position: Sitting, Cuff Size: Normal)   Pulse (!) 104   Ht 4' 10.5" (1.486 m)   Wt 124 lb 6.4 oz (56.4 kg)   SpO2 96%   BMI 25.56 kg/m  BP Readings from Last 3 Encounters:  12/02/23  126/86  08/27/23 122/78  04/09/23 115/76   Wt Readings from Last 3 Encounters:  12/02/23 124 lb 6.4 oz (56.4 kg)  08/27/23 126 lb (57.2 kg)  04/09/23 135 lb (61.2 kg)    Physical Exam Vitals and nursing note reviewed.  Constitutional:      Appearance: Normal appearance.  HENT:     Head: Normocephalic and atraumatic.     Right Ear: External ear normal.     Left Ear: External ear normal.     Nose: Nose normal.     Mouth/Throat:     Mouth: Mucous membranes are moist.     Pharynx: Oropharynx is clear.  Eyes:     Extraocular Movements: Extraocular movements intact.     Conjunctiva/sclera: Conjunctivae normal.     Pupils: Pupils are equal, round, and reactive to light.  Cardiovascular:     Rate and Rhythm: Normal rate and regular rhythm.     Pulses: Normal pulses.     Heart sounds: Normal heart sounds.  Pulmonary:     Effort: Pulmonary effort is normal.     Breath sounds: Normal breath sounds.  Musculoskeletal:     Cervical back: Neck supple. Tenderness present. Decreased range of motion.  Skin:    General: Skin is warm and dry.  Neurological:     General: No focal deficit present.     Mental Status: She is alert and oriented to person, place, and time.  Psychiatric:        Mood and Affect: Mood normal.        Behavior: Behavior normal.        Thought Content: Thought content normal.        Judgment: Judgment normal.       Assessment & Plan:   Problem List Items Addressed This Visit   None  1. Neck pain (Primary) Patient encouraged to take Mobic  consistently as prescribed and use Robaxin  as needed.  Follow up with Ortho for further  evaluation. - methylPREDNISolone  acetate (DEPO-MEDROL ) injection 80 mg - ketorolac (TORADOL) injection 60 mg   I have reviewed the patient's medical history (PMH, PSH, Social History, Family History, Medications, and allergies) , and have been updated if relevant. I spent 30 minutes reviewing chart and  face to face time with patient.    No follow-ups on file.    Etter Hermann Mayers, PA-C

## 2023-12-02 NOTE — Patient Instructions (Addendum)
 VISIT SUMMARY:  Today, we discussed your worsening pain and the difficulty you are having managing your symptoms. We reviewed your current medications and explored additional treatment options to help alleviate your pain and improve your quality of life.  YOUR PLAN:  -CHRONIC  PAIN:  Please follow up with orthopedics for further evaluation and management. Please avoid taking additional NSAIDs while you are on meloxicam .  Chronic Pain, Adult Chronic pain is a type of pain that lasts or keeps coming back for at least 3-6 months. You may have headaches, pain in the abdomen, or pain in other areas of the body. Chronic pain may be related to an illness, injury, or a health condition. Sometimes, the cause of chronic pain is not known. Chronic pain can make it hard for you to do daily activities. If it is not treated, chronic pain can lead to anxiety and depression. Treatment depends on the cause of your pain and how severe it is. You may need to work with a pain specialist to come up with a treatment plan. Many people benefit from two or more types of treatment to control their pain. Follow these instructions at home: Treatment plan Follow your treatment plan as told by your health care provider. This may include: Gentle, regular exercise. Eating a healthy diet that includes foods such as vegetables, fruits, fish, and lean meats. Mental health therapy (cognitive or behavioral therapy) that changes the way you think or act in response to the pain. This may help improve how you feel. Doing physical therapy exercises to improve movement and strength. Meditation, yoga, acupuncture, or massage therapy. Using the oils from plants in your environment or on your skin (aromatherapy). Other treatments may include: Over-the-counter or prescription medicines. Color, light, or sound therapy. Local electrical stimulation. The electrical pulses help to relieve pain by temporarily stopping the nerve impulses that  cause you to feel pain. Injections. These deliver numbing or pain-relieving medicines into the spine or the area of pain.  Medicines Take over-the-counter and prescription medicines only as told by your health care provider. Ask your health care provider if the medicine prescribed to you: Requires you to avoid driving or using machinery. Can cause constipation. You may need to take these actions to prevent or treat constipation: Drink enough fluid to keep your urine pale yellow. Take over-the-counter or prescription medicines. Eat foods that are high in fiber, such as beans, whole grains, and fresh fruits and vegetables. Limit foods that are high in fat and processed sugars, such as fried or sweet foods. Lifestyle  Ask your health care provider whether you should keep a pain diary. Your health care provider will tell you what information to write in the diary. This may include: When you have pain. What the pain feels like. How medicines and other behaviors or treatments help to reduce the pain. Consider talking with a mental health care provider about how to help manage chronic pain. Consider joining a chronic pain support group. Try to control or lower your stress levels. Talk with your health care provider about ways to do this. General instructions Learn as much as you can about how to manage your chronic pain. Ask your health care provider if an intensive pain rehabilitation program or a chronic pain specialist would be helpful. Check your pain level as told by your health care provider. Ask your health care provider if you should use a pain scale. Contact a health care provider if: Your pain is not controlled with treatment. You  have new pain. You have side effects from pain medicine. You feel weak or you have trouble doing your normal activities. You have trouble sleeping or you develop confusion. You lose feeling or have numbness in your body. You lose control of your bowels or  bladder. Get help right away if: Your pain suddenly gets much worse. You develop chest pain. You have trouble breathing or shortness of breath. You faint, or another person sees you faint. These symptoms may be an emergency. Get help right away. Call 911. Do not wait to see if the symptoms will go away. Do not drive yourself to the hospital. Also, get help right away if: You have thoughts about hurting yourself or others. Take one of these steps if you feel like you may hurt yourself or others, or have thoughts about taking your own life: Go to your nearest emergency room. Call 911. Call the National Suicide Prevention Lifeline at (715)236-5297 or 988. This is open 24 hours a day. Text the Crisis Text Line at 937 170 1413. This information is not intended to replace advice given to you by your health care provider. Make sure you discuss any questions you have with your health care provider. Document Revised: 02/04/2022 Document Reviewed: 01/07/2022 Elsevier Patient Education  2024 [employer].

## 2023-12-13 ENCOUNTER — Other Ambulatory Visit: Payer: Self-pay | Admitting: Internal Medicine

## 2023-12-13 DIAGNOSIS — F5104 Psychophysiologic insomnia: Secondary | ICD-10-CM

## 2023-12-14 ENCOUNTER — Telehealth: Payer: Self-pay | Admitting: Physician Assistant

## 2023-12-14 ENCOUNTER — Encounter: Payer: Self-pay | Admitting: Physician Assistant

## 2023-12-14 ENCOUNTER — Ambulatory Visit (INDEPENDENT_AMBULATORY_CARE_PROVIDER_SITE_OTHER): Admitting: Physician Assistant

## 2023-12-14 DIAGNOSIS — M542 Cervicalgia: Secondary | ICD-10-CM

## 2023-12-14 MED ORDER — TRAMADOL HCL 50 MG PO TABS: 50.0000 mg | ORAL_TABLET | Freq: Four times a day (QID) | ORAL | 0 refills | Status: DC | PRN

## 2023-12-14 NOTE — Telephone Encounter (Signed)
 Pt submitted medical release forms. Intermitted FMLA, amd payment $20.00

## 2023-12-14 NOTE — Progress Notes (Signed)
 Office Visit Note   Patient: Martha Manning           Date of Birth: June 29, 1956           MRN: [id number] Visit Date: 12/14/2023              Requested by: Lawrance Presume, MD [address] [place],  [place] [address] PCP: Lawrance Presume, MD  No chief complaint on file.     HPI: Patient is a 68 year old woman who have seen in the past she has advanced degenerative changes of her neck.  She has had no new injury.  She states that she has responded to IM steroids before.  Most recently her pain has become difficult enough that she went and had a Toradol  and steroid injection 2 weeks ago at a mobile pain clinic.  She said this helped make it tolerable she is not interested in any surgery at the time  Assessment & Plan: Visit Diagnoses:  1. Pain in neck   2. Neck pain     Plan: Discussed with the patient certainly difficult problem and her strength and sensation is intact just focal pain in the neck.  Recommend PT and if no improvement then an MRI.  Would like her to follow-up with Megan in about 6 weeks  Follow-Up Instructions: Return in about 6 weeks (around 01/25/2024).   Ortho Exam  Patient is alert, oriented, no adenopathy, well-dressed, normal affect, normal respiratory effort. Examination she has fairly good motion of her neck considering her arthritis with flexion extension turning side-to-side is somewhat more difficult for her.  Strength is intact no paresthesias    Imaging: No results found. No images are attached to the encounter.  Labs: Lab Results  Component Value Date   HGBA1C 5.4 12/21/2016   HGBA1C 5.9 (H) 03/14/2014     Lab Results  Component Value Date   ALBUMIN 4.4 04/22/2023   ALBUMIN 4.3 05/06/2022   ALBUMIN 4.4 05/20/2021    No results found for: MG Lab Results  Component Value Date   VD25OH 34.7 04/10/2021   VD25OH 59 03/14/2014    No results found for: PREALBUMIN    Latest Ref Rng & Units 04/22/2023    9:21  AM 05/06/2022    8:45 AM 10/07/2020    4:15 PM  CBC EXTENDED  WBC 3.4 - 10.8 x10E3/uL 7.6  8.8  6.8   RBC 3.77 - 5.28 x10E6/uL 4.62  4.75  4.67   Hemoglobin 11.1 - 15.9 g/dL 62.1  30.8  65.7   HCT 34.0 - 46.6 % 38.9  39.4  38.7   Platelets 150 - 450 x10E3/uL 385  361  366      There is no height or weight on file to calculate BMI.  Orders:  Orders Placed This Encounter  Procedures   Ambulatory referral to Physical Therapy   Meds ordered this encounter  Medications   traMADol  (ULTRAM ) 50 MG tablet    Sig: Take 1 tablet (50 mg total) by mouth every 6 (six) hours as needed.    Dispense:  30 tablet    Refill:  0     Procedures: No procedures performed  Clinical Data: No additional findings.  ROS:  All other systems negative, except as noted in the HPI. Review of Systems  Objective: Vital Signs: There were no vitals taken for this visit.  Specialty Comments:  No specialty comments available.  PMFS History: Patient Active Problem List  Diagnosis Date Noted   Neck pain 12/22/2022   Former smoker 08/20/2022   Influenza vaccine refused 06/07/2020   Overweight (BMI 25.0-29.9) 06/07/2020   Obesity (BMI 30-39.9) 05/04/2019   Mixed hyperlipidemia 05/04/2019   Sinus congestion 05/04/2019   Gastroesophageal reflux disease without esophagitis 07/26/2018   Diabetes mellitus screening 12/21/2016   Status post total replacement of right hip 10/08/2015   Ganglion of left wrist 05/16/2015   Essential hypertension 03/14/2014   Osteoarthritis of right hip 03/14/2014   Smoking 03/14/2014   Past Medical History:  Diagnosis Date   Allergy    Arthritis    right hip (10/08/2015)   GERD (gastroesophageal reflux disease)    depends on diet    Hypertension     Family History  Problem Relation Age of Onset   Hypertension Mother    Stroke Mother    Hypertension Sister    Colon cancer Neg Hx    Esophageal cancer Neg Hx    Rectal cancer Neg Hx    Stomach cancer Neg Hx     Pancreatic cancer Neg Hx    Prostate cancer Neg Hx     Past Surgical History:  Procedure Laterality Date   DENTAL SURGERY     TOTAL HIP ARTHROPLASTY Right 10/08/2015   Procedure: RIGHT TOTAL HIP ARTHROPLASTY ANTERIOR APPROACH;  Surgeon: Arnie Lao, MD;  Location: [facility] OR;  Service: Orthopedics;  Laterality: Right;   VAGINAL DELIVERY     41 yrs ago   Social History   Occupational History   Not on file  Tobacco Use   Smoking status: Former    Current packs/day: 0.00    Types: Cigarettes    Quit date: 06/29/2022    Years since quitting: 1.4   Smokeless tobacco: Former   Tobacco comments:    7 cigs a day   Substance and Sexual Activity   Alcohol use: Yes    Alcohol/week: 7.0 standard drinks of alcohol    Types: 7 Glasses of wine per week    Comment: 7 glasses of wine per week, one per day   Drug use: No   Sexual activity: Not Currently

## 2023-12-15 ENCOUNTER — Telehealth: Payer: Self-pay | Admitting: Physician Assistant

## 2023-12-15 NOTE — Telephone Encounter (Signed)
 Patient submitted forms for intermittent leave for flare ups. Please advise if okay. Thank you!

## 2023-12-23 ENCOUNTER — Encounter: Payer: Self-pay | Admitting: Internal Medicine

## 2023-12-23 ENCOUNTER — Ambulatory Visit: Payer: BC Managed Care – PPO | Attending: Internal Medicine | Admitting: Internal Medicine

## 2023-12-23 VITALS — BP 111/70 | HR 71 | Temp 98.2°F | Ht <= 58 in | Wt 125.0 lb

## 2023-12-23 DIAGNOSIS — M542 Cervicalgia: Secondary | ICD-10-CM | POA: Diagnosis not present

## 2023-12-23 DIAGNOSIS — I10 Essential (primary) hypertension: Secondary | ICD-10-CM

## 2023-12-23 DIAGNOSIS — M47812 Spondylosis without myelopathy or radiculopathy, cervical region: Secondary | ICD-10-CM | POA: Diagnosis not present

## 2023-12-23 DIAGNOSIS — G8929 Other chronic pain: Secondary | ICD-10-CM

## 2023-12-23 DIAGNOSIS — E782 Mixed hyperlipidemia: Secondary | ICD-10-CM

## 2023-12-23 DIAGNOSIS — U070 Vaping-related disorder: Secondary | ICD-10-CM

## 2023-12-23 DIAGNOSIS — Z720 Tobacco use: Secondary | ICD-10-CM

## 2023-12-23 MED ORDER — AMLODIPINE BESYLATE 5 MG PO TABS: ORAL_TABLET | ORAL | 1 refills | Status: DC

## 2023-12-23 MED ORDER — LISINOPRIL 20 MG PO TABS: 20.0000 mg | ORAL_TABLET | Freq: Every day | ORAL | 1 refills | Status: DC

## 2023-12-23 NOTE — Progress Notes (Signed)
 Patient ID: Martha Manning, female    DOB: 1955-08-08  MRN: [id number]  CC: Hypertension (HTN f/u. Med refill. [employer] arthritis pain [employer] FMLA form completion )   Subjective: Martha Manning is a 68 y.o. female who presents for chronic ds management. Her concerns today include:  history of HTN, HL (declined statin) tob dependence, obesity, OA of right hip status post THR, insomnia   Discussed the use of AI scribe software for clinical note transcription with the patient, who gave verbal consent to proceed.  History of Present Illness Martha Manning is a 68 year old female who presents for chronic ds management and FMLA form completion due to chronic pain from arthritis.  She experiences daily, constant pain from arthritis, primarily affecting her neck, and shoulders. The pain can become severe, occasionally shooting through her head, and sometimes requires her to stay home from work.  She recently saw orthopedics for the same.  She had x-rays of the neck done last year that revealed osteoarthritis changes.  Ortho referred her for physical therapy which she will start on 10 July.  If no improvement, orthopedics plan to get an MRI of the neck.  She currently takes meloxicam  and tramadol  for pain.  She works as a runner, which involves physical activity that can exacerbate her pain. She seeks intermittent leave through [facility] to manage her condition, estimating a need for three to five days off per month.  HTN: She also takes lisinopril  20 mg and amlodipine  5 mg for hypertension.  She has not been checking blood pressure recently.  She quit smoking in April last year and now uses a vape with a small amount of nicotine , especially when in pain.  HL:  She has made dietary changes, resulting in weight loss and normalized cholesterol levels.  Cholesterol levels were checked on last visit 4 months ago and had normalized.  HM: Defers on referral for bone density until neck pain is  better.    Patient Active Problem List   Diagnosis Date Noted   Neck pain 12/22/2022   Former smoker 08/20/2022   Influenza vaccine refused 06/07/2020   Overweight (BMI 25.0-29.9) 06/07/2020   Obesity (BMI 30-39.9) 05/04/2019   Mixed hyperlipidemia 05/04/2019   Sinus congestion 05/04/2019   Gastroesophageal reflux disease without esophagitis 07/26/2018   Diabetes mellitus screening 12/21/2016   Status post total replacement of right hip 10/08/2015   Ganglion of left wrist 05/16/2015   Essential hypertension 03/14/2014   Osteoarthritis of right hip 03/14/2014   Smoking 03/14/2014     Current Outpatient Medications on File Prior to Visit  Medication Sig Dispense Refill   aspirin  81 MG chewable tablet Chew 81 mg by mouth daily.     fluticasone  (FLONASE ) 50 MCG/ACT nasal spray Place 1 spray into both nostrils daily as needed for allergies or rhinitis. 16 g 3   loratadine  (CLARITIN ) 10 MG tablet Take 10 mg by mouth daily as needed for allergies or itching.     meloxicam  (MOBIC ) 15 MG tablet Take 1 tablet (15 mg total) by mouth daily. 30 tablet 5   methocarbamol  (ROBAXIN ) 500 MG tablet Take 1 tablet (500 mg total) by mouth every 6 (six) hours as needed for muscle spasms. 30 tablet 2   Multiple Vitamin (MULTIVITAMIN WITH MINERALS) TABS tablet Take 1 tablet by mouth daily as needed (for supplementation). Reported on 12/02/2015     omeprazole  (PRILOSEC) 20 MG capsule Take 1 capsule by mouth once daily 30 capsule 0  traMADol  (ULTRAM ) 50 MG tablet Take 1 tablet (50 mg total) by mouth every 6 (six) hours as needed. 30 tablet 0   zolpidem  (AMBIEN ) 5 MG tablet TAKE 1 TABLET BY MOUTH AT BEDTIME AS NEEDED FOR SLEEP 30 tablet 1   nicotine  (NICODERM CQ  - DOSED IN MG/24 HOURS) 14 mg/24hr patch Place 1 patch (14 mg total) onto the skin daily. (Patient not taking: Reported on 12/23/2023) 28 patch 1   No current facility-administered medications on file prior to visit.    Allergies  Allergen Reactions    Carvedilol      Makes her feel winded when she walks upstairs.   Clindamycin/Lincomycin     Pt states she took this antibiotic once and broke out in a rash.    Codeine    Norvasc  [Amlodipine  Besylate] Swelling    Social History   Socioeconomic History   Marital status: Divorced    Spouse name: Not on file   Number of children: Not on file   Years of education: Not on file   Highest education level: Not on file  Occupational History   Not on file  Tobacco Use   Smoking status: Former    Current packs/day: 0.00    Types: Cigarettes    Quit date: 06/29/2022    Years since quitting: 1.4   Smokeless tobacco: Former   Tobacco comments:    7 cigs a day   Substance and Sexual Activity   Alcohol use: Yes    Alcohol/week: 7.0 standard drinks of alcohol    Types: 7 Glasses of wine per week    Comment: 7 glasses of wine per week, one per day   Drug use: No   Sexual activity: Not Currently  Other Topics Concern   Not on file  Social History Narrative   Not on file   Social Drivers of Health   Financial Resource Strain: High Risk (04/09/2023)   Overall Financial Resource Strain (CARDIA)    Difficulty of Paying Living Expenses: Hard  Food Insecurity: No Food Insecurity (04/09/2023)   Hunger Vital Sign    Worried About Running Out of Food in the Last Year: Never true    Ran Out of Food in the Last Year: Never true  Transportation Needs: No Transportation Needs (04/09/2023)   PRAPARE - Administrator, Civil Service (Medical): No    Lack of Transportation (Non-Medical): No  Physical Activity: Insufficiently Active (04/09/2023)   Exercise Vital Sign    Days of Exercise per Week: 2 days    Minutes of Exercise per Session: 60 min  Stress: No Stress Concern Present (04/09/2023)   [employer] of Occupational Health - Occupational Stress Questionnaire    Feeling of Stress : Not at all  Social Connections: Moderately Integrated (04/09/2023)   Social Connection and  Isolation Panel    Frequency of Communication with Friends and Family: More than three times a week    Frequency of Social Gatherings with Friends and Family: Once a week    Attends Religious Services: More than 4 times per year    Active Member of [employer] or Organizations: Yes    Attends Banker Meetings: More than 4 times per year    Marital Status: Divorced  Intimate Partner Violence: Not At Risk (04/09/2023)   Humiliation, Afraid, Rape, and Kick questionnaire    Fear of Current or Ex-Partner: No    Emotionally Abused: No    Physically Abused: No    Sexually Abused: No  Family History  Problem Relation Age of Onset   Hypertension Mother    Stroke Mother    Hypertension Sister    Colon cancer Neg Hx    Esophageal cancer Neg Hx    Rectal cancer Neg Hx    Stomach cancer Neg Hx    Pancreatic cancer Neg Hx    Prostate cancer Neg Hx     Past Surgical History:  Procedure Laterality Date   DENTAL SURGERY     TOTAL HIP ARTHROPLASTY Right 10/08/2015   Procedure: RIGHT TOTAL HIP ARTHROPLASTY ANTERIOR APPROACH;  Surgeon: Lonni CINDERELLA Poli, MD;  Location: [facility] OR;  Service: Orthopedics;  Laterality: Right;   VAGINAL DELIVERY     41 yrs ago    ROS: Review of Systems Negative except as stated above  PHYSICAL EXAM: BP 111/70 (BP Location: Left Arm, Patient Position: Sitting, Cuff Size: Normal)   Pulse 71   Temp 98.2 F (36.8 C) (Oral)   Ht 4' 10 (1.473 m)   Wt 125 lb (56.7 kg)   SpO2 98%   BMI 26.13 kg/m   Wt Readings from Last 3 Encounters:  12/23/23 125 lb (56.7 kg)  12/02/23 124 lb 6.4 oz (56.4 kg)  08/27/23 126 lb (57.2 kg)    Physical Exam  General appearance - alert, well appearing, and in no distress Mental status - normal mood, behavior, speech, dress, motor activity, and thought processes Chest - clear to auscultation, no wheezes, rales or rhonchi, symmetric air entry Heart - normal rate, regular rhythm, normal S1, S2, no murmurs, rubs, clicks  or gallops Extremities - peripheral pulses normal, no pedal edema, no clubbing or cyanosis      Latest Ref Rng & Units 04/22/2023    9:21 AM 05/06/2022    8:45 AM 05/20/2021    3:49 PM  CMP  Glucose 70 - 99 mg/dL 82  89  87   BUN 8 - 27 mg/dL 8  9  11    Creatinine 0.57 - 1.00 mg/dL 9.30  9.33  9.21   Sodium 134 - 144 mmol/L 142  137  139   Potassium 3.5 - 5.2 mmol/L 4.3  4.1  4.4   Chloride 96 - 106 mmol/L 105  102  104   CO2 20 - 29 mmol/L 23  22  23    Calcium 8.7 - 10.3 mg/dL 89.8  89.7  89.6   Total Protein 6.0 - 8.5 g/dL 6.8  6.7  6.6   Total Bilirubin 0.0 - 1.2 mg/dL 0.3  0.3  <9.7   Alkaline Phos 44 - 121 IU/L 75  80  83   AST 0 - 40 IU/L 18  15  15    ALT 0 - 32 IU/L 13  12  13     Lipid Panel     Component Value Date/Time   CHOL 177 08/27/2023 1654   TRIG 117 08/27/2023 1654   HDL 64 08/27/2023 1654   CHOLHDL 2.8 08/27/2023 1654   CHOLHDL 3.1 03/14/2014 1102   VLDL 15 03/14/2014 1102   LDLCALC 92 08/27/2023 1654    CBC    Component Value Date/Time   WBC 7.6 04/22/2023 0921   WBC 9.9 10/10/2015 0520   RBC 4.62 04/22/2023 0921   RBC 3.28 (L) 10/10/2015 0520   HGB 12.0 04/22/2023 0921   HCT 38.9 04/22/2023 0921   PLT 385 04/22/2023 0921   MCV 84 04/22/2023 0921   MCH 26.0 (L) 04/22/2023 0921   MCH 25.0 (L) 10/10/2015 9479  MCHC 30.8 (L) 04/22/2023 0921   MCHC 31.5 10/10/2015 0520   RDW 14.7 04/22/2023 0921   LYMPHSABS CANCELED 06/07/2020 1623   MONOABS 0.9 03/14/2014 1102   EOSABS CANCELED 06/07/2020 1623   BASOSABS CANCELED 06/07/2020 1623    ASSESSMENT AND PLAN:  Assessment and Plan Assessment & Plan Osteoarthritis of cervical spine/chronic neck pain  She has seen orthopedic recently and had injection.  She has been referred for physical therapy with plan to do MRI if physical therapy does not help.   - I will complete FMLA forms for 3-5 days off per month. - Continue meloxicam , consider combining with tramadol . - Start physical therapy on July  10th.  Hypertension Well-controlled with lisinopril  20 mg daily and amlodipine  5 mg daily.  Continue current medications.  Hyperlipidemia Cholesterol levels normalized with dietary changes.  - Continue dietary modifications.  Nicotine  use Quit smoking but continues vaping, especially during pain. Vaping not established as safe. - Encourage cessation of vaping.   Patient was given the opportunity to ask questions.  Patient verbalized understanding of the plan and was able to repeat key elements of the plan.   This documentation was completed using Paediatric nurse.  Any transcriptional errors are unintentional.  No orders of the defined types were placed in this encounter.    Requested Prescriptions   Signed Prescriptions Disp Refills   amLODipine  (NORVASC ) 5 MG tablet 90 tablet 1    Sig: TAKE 1 TABLET BY MOUTH ONCE DAILY .   lisinopril  (ZESTRIL ) 20 MG tablet 90 tablet 1    Sig: Take 1 tablet (20 mg total) by mouth daily.    Return in about 4 months (around 04/23/2024).  Barnie Louder, MD, FACP

## 2023-12-23 NOTE — Patient Instructions (Signed)
 VISIT SUMMARY:  Today, we discussed your chronic arthritis pain and the need for FMLA leave to manage your condition. We also reviewed your hypertension, cholesterol levels, and nicotine  use.  YOUR PLAN:  -ARTHRITIS: Arthritis is a condition that causes inflammation and pain in your joints. You experience daily pain, especially in your spine, neck, and shoulder. We will complete your FMLA forms for 3-5 days off per month. Continue taking meloxicam  and consider combining it with tramadol . Start physical therapy on July 10th, and if it doesn't help, we may consider an MRI.  -HYPERTENSION: Hypertension, or high blood pressure, is well-controlled with your current medications. Your blood pressure today was 111/70 mmHg. Continue taking lisinopril  20 mg and amlodipine  5 mg as prescribed.  -HYPERLIPIDEMIA: Hyperlipidemia means having high cholesterol levels, which can increase the risk of heart disease. Your cholesterol levels are now normal due to your dietary changes. Continue with these dietary modifications.  -NICOTINE  USE: You have quit smoking but continue to use a vape, especially when in pain. Vaping is not established as safe. We encourage you to stop vaping.  INSTRUCTIONS:  Start physical therapy on July 10th. If physical therapy does not improve your condition, we may consider an MRI. Continue taking your medications as prescribed and maintain your dietary changes. We will complete your FMLA forms for 3-5 days off per month.

## 2024-01-06 ENCOUNTER — Ambulatory Visit (INDEPENDENT_AMBULATORY_CARE_PROVIDER_SITE_OTHER): Admitting: Rehabilitative and Restorative Service Providers"

## 2024-01-06 ENCOUNTER — Encounter: Payer: Self-pay | Admitting: Rehabilitative and Restorative Service Providers"

## 2024-01-06 DIAGNOSIS — M6281 Muscle weakness (generalized): Secondary | ICD-10-CM

## 2024-01-06 DIAGNOSIS — M542 Cervicalgia: Secondary | ICD-10-CM

## 2024-01-06 DIAGNOSIS — R293 Abnormal posture: Secondary | ICD-10-CM

## 2024-01-06 NOTE — Therapy (Signed)
 OUTPATIENT PHYSICAL THERAPY CERVICAL EVALUATION   Patient Name: Martha Manning MRN: [id number] DOB:1956-04-02, 68 y.o., female Today's Date: 01/06/2024  END OF SESSION:  PT End of Session - 01/06/24 1655     Visit Number 1    Number of Visits 16    Date for PT Re-Evaluation 03/02/24    Authorization Type BCBS    Authorization - Number of Visits 30    Progress Note Due on Visit 16    PT Start Time 1600    PT Stop Time 1647    PT Time Calculation (min) 47 min    Activity Tolerance Patient tolerated treatment well;No increased pain;Patient limited by pain    Behavior During Therapy [facility] for tasks assessed/performed          Past Medical History:  Diagnosis Date   Allergy    Arthritis    right hip (10/08/2015)   GERD (gastroesophageal reflux disease)    depends on diet    Hypertension    Past Surgical History:  Procedure Laterality Date   DENTAL SURGERY     TOTAL HIP ARTHROPLASTY Right 10/08/2015   Procedure: RIGHT TOTAL HIP ARTHROPLASTY ANTERIOR APPROACH;  Surgeon: Lonni CINDERELLA Poli, MD;  Location: [facility] OR;  Service: Orthopedics;  Laterality: Right;   VAGINAL DELIVERY     41 yrs ago   Patient Active Problem List   Diagnosis Date Noted   Neck pain 12/22/2022   Former smoker 08/20/2022   Influenza vaccine refused 06/07/2020   Overweight (BMI 25.0-29.9) 06/07/2020   Obesity (BMI 30-39.9) 05/04/2019   Mixed hyperlipidemia 05/04/2019   Sinus congestion 05/04/2019   Gastroesophageal reflux disease without esophagitis 07/26/2018   Diabetes mellitus screening 12/21/2016   Status post total replacement of right hip 10/08/2015   Ganglion of left wrist 05/16/2015   Essential hypertension 03/14/2014   Osteoarthritis of right hip 03/14/2014   Smoking 03/14/2014    PCP: Barnie WENDI Louder, MD  REFERRING PROVIDER: Ronal Dragon Persons, PA  REFERRING DIAG: M54.2 (ICD-10-CM) - Pain in neck  THERAPY DIAG:  Abnormal posture - Plan: PT plan of care  cert/re-cert  Muscle weakness (generalized) - Plan: PT plan of care cert/re-cert  Cervicalgia - Plan: PT plan of care cert/re-cert  Rationale for Evaluation and Treatment: Rehabilitation  ONSET DATE: Chronic, about a year  SUBJECTIVE:                                                                                                                                                                                                         SUBJECTIVE  STATEMENT: Livana notes Right upper shoulder/trap pain of a year duration.   She is very affected by weather.  Spasm and pain at all times in now the norm.  She is unable to sleep on her right side and feels as if she is far more disabled than she should be.    PERTINENT HISTORY:  OA, HTN, Rt THA 10/08/2015, former smoker  PAIN:  Are you having pain? Yes: NPRS scale: 4-7/10 this week Pain location: Rt upper trap and cervical spine Pain description: Ache, constant throb, spasm Aggravating factors: Lie on the right side, weather, checking mirrors in the car, prolonged sitting Relieving factors: Cortisone shot  PRECAUTIONS: Cervical  RED FLAGS: None     WEIGHT BEARING RESTRICTIONS: No  FALLS:  Has patient fallen in last 6 months? 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  NEXT MD VISIT: 01/25/2024  OBJECTIVE:  Note: Objective measures were completed at Evaluation unless otherwise noted.  DIAGNOSTIC FINDINGS:  2 views of her cervical spine demonstrate straightening of the normal  lordosis.  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  Cannot appreciate any  acute fractures  PATIENT SURVEYS:  PSFS: THE PATIENT SPECIFIC FUNCTIONAL SCALE  Place score of 0-10 (0 = unable to perform activity and 10 = able to perform activity at the same level as before injury or problem)  Activity Date: 01/06/2024    Check mirrors when driving 8/89    2.  Quick cervical active range of motion movements 0/10    3.  Reaching or overhead function 1/10    4.  Sleeping on the right side 0/10    Total Score 0.5      Total Score = Sum of activity scores/number of activities  Minimally Detectable Change: 3 points (for single activity); 2 points (for average score)  [facility] (nd). The Patient Specific Functional Scale . Retrieved from [email or web address]   COGNITION: Overall cognitive status: Within functional limits for tasks assessed  SENSATION: WFL  POSTURE: rounded shoulders, forward head, and decreased lumbar lordosis   CERVICAL ROM:   Active ROM A/PROM (deg) eval  Flexion   Extension 60  Right lateral flexion 30  Left lateral flexion 20  Right rotation 30  Left rotation 25   (Blank rows = not tested)  UPPER EXTREMITY ROM:  Active ROM Right eval Left eval  Shoulder flexion    Shoulder extension    Shoulder abduction    Shoulder adduction    Shoulder extension    Shoulder internal rotation    Shoulder external rotation    Elbow flexion    Elbow extension    Wrist flexion    Wrist extension    Wrist ulnar deviation    Wrist radial deviation    Wrist pronation    Wrist supination     (Blank rows = not tested)  UPPER EXTREMITY STRENGTH:  In pounds assessed with hand-held dynamometer Left/Right 01/06/2024   Shoulder flexion    Shoulder extension    Shoulder abduction    Shoulder adduction    Shoulder extension    Shoulder internal rotation    Shoulder external rotation  Middle trapezius    Lower trapezius    Elbow flexion    Elbow extension    Wrist  flexion    Wrist extension    Wrist ulnar deviation    Wrist radial deviation    Wrist pronation    Wrist supination    Grip strength    Cervical Extension 16.5 (Goal 30)   Cervical Lateral Bending 14.1/8.4 (Goal 18)    (Blank rows = not tested)   TREATMENT DATE: 01/06/2024 Scapular retraction/shoulder blade pinches 10 x 5 seconds Cervical rotation active range of motion with shoulders pulled back 10 x 5 seconds Cervical extension isometrics 10 x 5 seconds                      97535: Reviewed exam findings, imaging with spine model, postural basics and day 1 home exercise program.  Answered questions about massage and topical pain relievers.                                                                                                            PATIENT EDUCATION:  Education details: See above Person educated: Patient Education method: Explanation, Demonstration, Tactile cues, Verbal cues, and Handouts Education comprehension: verbalized understanding, returned demonstration, verbal cues required, tactile cues required, and needs further education  HOME EXERCISE PROGRAM: Access Code: [email or web address] URL: [email or web address] Date: 01/06/2024 Prepared by: Lamar Ivory  Exercises - Standing Scapular Retraction  - 5 x daily - 7 x weekly - 1 sets - 5 reps - 5 second hold - Seated Cervical Rotation AROM  - 3-5 x daily - 7 x weekly - 1 sets - 10 reps - 5 seconds hold - Standing Isometric Cervical Extension with Manual Resistance  - 5 x daily - 7 x weekly - 1 sets - 5 reps - 5 hold  ASSESSMENT:  CLINICAL IMPRESSION: Patient is a 67 y.o. female who was seen today for physical therapy evaluation and treatment for M54.2 (ICD-10-CM) - Pain in neck.  Molly has a 1 year history of chronic neck pain with symptoms worsening from being aggravating with poor weather to constant spasm.  She notes difficulty sleeping on the right side, turning her head to check her mirrors and with  reaching and overhead function.  Examination showed significant limitations in cervical active range of motion, flexibility, cervical and postural strength.  Her prognosis to meet the below listed goals is good with the recommended plan of care.  OBJECTIVE IMPAIRMENTS: decreased activity tolerance, decreased endurance, decreased knowledge of condition, decreased ROM, decreased strength, impaired perceived functional ability, increased muscle spasms, impaired flexibility, impaired UE functional use, improper body mechanics, postural dysfunction, and pain.   ACTIVITY LIMITATIONS: carrying, lifting, sleeping, and reach over head  PARTICIPATION LIMITATIONS: community activity and occupation  PERSONAL FACTORS: OA, HTN, Rt THA 10/08/2015, former smoker are also affecting patient's functional outcome.   REHAB POTENTIAL: Good  CLINICAL DECISION MAKING: Stable/uncomplicated  EVALUATION COMPLEXITY: Low   GOALS: Goals reviewed with patient? Yes  SHORT TERM GOALS: Target date: 02/03/2024  [facility]  will be independent with her day 1 home exercise program Baseline: Started 01/06/2024 Goal status: INITIAL  2.  Improve cervical active range of motion for extension to 65; lateral bending to 25 and rotation to 50 degrees Baseline: 60; 20/30 and 25/30 Goal status: INITIAL  3.  Improve cervical extension strength to at least 25 pounds Baseline: 16.5 pounds Goal status: INITIAL   LONG TERM GOALS: Target date: 03/02/2024   Improve patient specific functional score to at least 5 Baseline: 0.5 Goal status: INITIAL  2.  Taleigh will report neck and right upper quadrant pain as consistently 2-4/10 or better on the visual analog scale Baseline: 4-7/10 Goal status: INITIAL  3.  Improve cervical rotation active range of motion to 60 degrees Baseline: 25/30 degrees Goal status: INITIAL  4.  Improve cervical extension strength to at least 30 pounds and lateral bending to at least 18 pounds Baseline: 16.5  pounds and 14.1/8.4 pounds respectively Goal status: INITIAL  5.  Ksenia will be independent with a long-term maintenance home exercise program at discharge Baseline: 01/06/2024 Goal status: INITIAL   PLAN:  PT FREQUENCY: 1-2x/week  PT DURATION: 8 weeks  PLANNED INTERVENTIONS: 97110-Therapeutic exercises, 97530- Therapeutic activity, 97112- Neuromuscular re-education, 97535- Self Care, 02859- Manual therapy, 97012- Traction (mechanical), [phone] (1-2 muscles), 20561 (3+ muscles)- Dry Needling, Patient/Family education, Spinal mobilization, Cryotherapy, and Moist heat  PLAN FOR NEXT SESSION: Review day 1 home exercise program, progress scapular and postural strength, education on body mechanics and posture to assist with ADLs.   Myer LELON Ivory, PT, MPT 01/06/2024, 5:14 PM

## 2024-01-16 NOTE — Therapy (Signed)
 OUTPATIENT PHYSICAL THERAPY CERVICAL TREATMENT   Patient Name: Martha Manning MRN: [id number] DOB:[birth year], 68 y.o., female Today's Date: 01/17/24  END OF SESSION:  PT End of Session - 01/17/24 1652     Visit Number 2    Number of Visits 16    Date for PT Re-Evaluation 03/02/24    Authorization Type BCBS    Authorization - Number of Visits 30    Progress Note Due on Visit 16    PT Start Time 1600    PT Stop Time 1640    PT Time Calculation (min) 40 min    Activity Tolerance Patient tolerated treatment well    Behavior During Therapy WFL for tasks assessed/performed           Past Medical History:  Diagnosis Date   Allergy    Arthritis    right hip (10/08/2015)   GERD (gastroesophageal reflux disease)    depends on diet    Hypertension    Past Surgical History:  Procedure Laterality Date   DENTAL SURGERY     TOTAL HIP ARTHROPLASTY Right 10/08/2015   Procedure: RIGHT TOTAL HIP ARTHROPLASTY ANTERIOR APPROACH;  Surgeon: Lonni CINDERELLA Poli, MD;  Location: [facility] OR;  Service: Orthopedics;  Laterality: Right;   VAGINAL DELIVERY     41 yrs ago   Patient Active Problem List   Diagnosis Date Noted   Neck pain 12/22/2022   Former smoker 08/20/2022   Influenza vaccine refused 06/07/2020   Overweight (BMI 25.0-29.9) 06/07/2020   Obesity (BMI 30-39.9) 05/04/2019   Mixed hyperlipidemia 05/04/2019   Sinus congestion 05/04/2019   Gastroesophageal reflux disease without esophagitis 07/26/2018   Diabetes mellitus screening 12/21/2016   Status post total replacement of right hip 10/08/2015   Ganglion of left wrist 05/16/2015   Essential hypertension 03/14/2014   Osteoarthritis of right hip 03/14/2014   Smoking 03/14/2014    PCP: Barnie WENDI Louder, MD  REFERRING PROVIDER: Ronal Dragon Persons, PA  REFERRING DIAG: M54.2 (ICD-10-CM) - Pain in neck  THERAPY DIAG:  Abnormal posture  Muscle weakness (generalized)  Cervicalgia  Rationale for Evaluation and  Treatment: Rehabilitation  ONSET DATE: Chronic, about a year  SUBJECTIVE:                                                                                                                                                                                                         SUBJECTIVE STATEMENT: Pt reports she has been compliant with HEP; but  neck won't move.  Driving is affected.   Eval:  Shareen notes Right  upper shoulder/trap pain of a year duration.   She is very affected by weather.  Spasm and pain at all times in now the norm.  She is unable to sleep on her right side and feels as if she is far more disabled than she should be.    PERTINENT HISTORY:  OA, HTN, Rt THA 10/08/2015, former smoker  PAIN:  Are you having pain? Yes: NPRS scale: 6-7/10  Pain location: Rt upper trap and cervical spine Pain description: Ache, constant throb, spasm Aggravating factors: Lie on the right side, weather, checking mirrors in the car, prolonged sitting Relieving factors: Cortisone shot  PRECAUTIONS: Cervical  RED FLAGS: None     WEIGHT BEARING RESTRICTIONS: No  FALLS:  Has patient fallen in last 6 months? 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  NEXT MD VISIT: 01/25/2024  OBJECTIVE:  Note: Objective measures were completed at Evaluation unless otherwise noted.  DIAGNOSTIC FINDINGS:  2 views of her cervical spine demonstrate straightening of the normal  lordosis.  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  Cannot appreciate any acute fractures  PATIENT SURVEYS:  PSFS: THE PATIENT SPECIFIC FUNCTIONAL  SCALE  Place score of 0-10 (0 = unable to perform activity and 10 = able to perform activity at the same level as before injury or problem)  Activity Date: 01/06/2024    Check mirrors when driving 8/89    2.  Quick cervical active range of motion movements 0/10    3.  Reaching or overhead function 1/10    4.  Sleeping on the right side 0/10    Total Score 0.5      Total Score = Sum of activity scores/number of activities  Minimally Detectable Change: 3 points (for single activity); 2 points (for average score)  [employer] [facility] (nd). The Patient Specific Functional Scale . Retrieved from [email or web address]   COGNITION: Overall cognitive status: Within functional limits for tasks assessed  SENSATION: WFL  POSTURE: rounded shoulders, forward head, and decreased lumbar lordosis   CERVICAL ROM:   Active ROM A/PROM (deg) eval  Flexion   Extension 60  Right lateral flexion 30  Left lateral flexion 20  Right rotation 30  Left rotation 25   (Blank rows = not tested)  UPPER EXTREMITY ROM:  Active ROM Right eval Left eval  Shoulder flexion    Shoulder extension    Shoulder abduction    Shoulder adduction    Shoulder extension    Shoulder internal rotation    Shoulder external rotation    Elbow flexion    Elbow extension    Wrist flexion    Wrist extension    Wrist ulnar deviation    Wrist radial deviation    Wrist pronation    Wrist supination     (Blank rows = not tested)  UPPER EXTREMITY STRENGTH:  In pounds assessed with hand-held dynamometer Left/Right 01/06/2024   Shoulder flexion    Shoulder extension    Shoulder abduction    Shoulder adduction    Shoulder extension    Shoulder internal rotation    Shoulder external rotation    Middle trapezius  Lower trapezius    Elbow flexion    Elbow extension    Wrist flexion    Wrist extension    Wrist ulnar deviation    Wrist radial deviation     Wrist pronation    Wrist supination    Grip strength    Cervical Extension 16.5 (Goal 30)   Cervical Lateral Bending 14.1/8.4 (Goal 18)    (Blank rows = not tested)   TREATMENT DATE:  01/17/24  There ex:  Backward shoulder rolls 2x10 UBE 2 min backwards Neuro Re Ed: Scapular retraction/shoulder blade pinches 10 x 5 seconds Cervical rotation active range of motion with shoulders pulled back 10 x 5 seconds Cervical extension isometrics 10 x 5 seconds                     Trial of ball extensions Cervical rotations on pillow 2x10  Manual treatment: SOR Manual traction with UT stretching Soft tissue mobilization     01/06/2024 Scapular retraction/shoulder blade pinches 10 x 5 seconds Cervical rotation active range of motion with shoulders pulled back 10 x 5 seconds Cervical extension isometrics 10 x 5 seconds                      97535: Reviewed exam findings, imaging with spine model, postural basics and day 1 home exercise program.  Answered questions about massage and topical pain relievers.                                                                                                            PATIENT EDUCATION:  Education details: See above Person educated: Patient Education method: Explanation, Demonstration, Tactile cues, Verbal cues, and Handouts Education comprehension: verbalized understanding, returned demonstration, verbal cues required, tactile cues required, and needs further education  HOME EXERCISE PROGRAM: Access Code: [email or web address] URL: [email or web address] Date: 01/06/2024 Prepared by: Lamar Ivory  Exercises - Standing Scapular Retraction  - 5 x daily - 7 x weekly - 1 sets - 5 reps - 5 second hold - Seated Cervical Rotation AROM  - 3-5 x daily - 7 x weekly - 1 sets - 10 reps - 5 seconds hold - Standing Isometric Cervical Extension with Manual Resistance  - 5 x daily - 7 x weekly - 1 sets - 5 reps - 5 hold  ASSESSMENT:  CLINICAL  IMPRESSION: Pt needed VC and tactile cues for proper cervical extension in supine and seated position. Pt demonstrated understanding.   Slight decrease pain after manual stretching.   Eval: Patient is a 68 y.o. female who was seen today for physical therapy evaluation and treatment for M54.2 (ICD-10-CM) - Pain in neck.  Molly has a 1 year history of chronic neck pain with symptoms worsening from being aggravating with poor weather to constant spasm.  She notes difficulty sleeping on the right side, turning her head to check her mirrors and with reaching and overhead function.  Examination showed significant limitations in cervical active range of motion, flexibility, cervical and postural strength.  Her prognosis  to meet the below listed goals is good with the recommended plan of care.  OBJECTIVE IMPAIRMENTS: decreased activity tolerance, decreased endurance, decreased knowledge of condition, decreased ROM, decreased strength, impaired perceived functional ability, increased muscle spasms, impaired flexibility, impaired UE functional use, improper body mechanics, postural dysfunction, and pain.   ACTIVITY LIMITATIONS: carrying, lifting, sleeping, and reach over head  PARTICIPATION LIMITATIONS: community activity and occupation  PERSONAL FACTORS: OA, HTN, Rt THA 10/08/2015, former smoker are also affecting patient's functional outcome.   REHAB POTENTIAL: Good  CLINICAL DECISION MAKING: Stable/uncomplicated  EVALUATION COMPLEXITY: Low   GOALS: Goals reviewed with patient? Yes  SHORT TERM GOALS: Target date: 02/03/2024  Wonda will be independent with her day 1 home exercise program Baseline: Started 01/06/2024 Goal status: INITIAL  2.  Improve cervical active range of motion for extension to 65; lateral bending to 25 and rotation to 50 degrees Baseline: 60; 20/30 and 25/30 Goal status: INITIAL  3.  Improve cervical extension strength to at least 25 pounds Baseline: 16.5 pounds Goal  status: INITIAL   LONG TERM GOALS: Target date: 03/02/2024   Improve patient specific functional score to at least 5 Baseline: 0.5 Goal status: INITIAL  2.  Latroya will report neck and right upper quadrant pain as consistently 2-4/10 or better on the visual analog scale Baseline: 4-7/10 Goal status: INITIAL  3.  Improve cervical rotation active range of motion to 60 degrees Baseline: 25/30 degrees Goal status: INITIAL  4.  Improve cervical extension strength to at least 30 pounds and lateral bending to at least 18 pounds Baseline: 16.5 pounds and 14.1/8.4 pounds respectively Goal status: INITIAL  5.  Destenee will be independent with a long-term maintenance home exercise program at discharge Baseline: 01/06/2024 Goal status: INITIAL   PLAN:  PT FREQUENCY: 1-2x/week  PT DURATION: 8 weeks  PLANNED INTERVENTIONS: 97110-Therapeutic exercises, 97530- Therapeutic activity, [id number]- Neuromuscular re-education, 97535- Self Care, 02859- Manual therapy, 97012- Traction (mechanical), 20560 (1-2 muscles), 20561 (3+ muscles)- Dry Needling, Patient/Family education, Spinal mobilization, Cryotherapy, and Moist heat  PLAN FOR NEXT SESSION: Increase UBE time and try T band exercises.     Burnard CHRISTELLA Meth, PT, MPT 01/17/2024, 4:55 PM

## 2024-01-17 ENCOUNTER — Ambulatory Visit (INDEPENDENT_AMBULATORY_CARE_PROVIDER_SITE_OTHER)

## 2024-01-17 DIAGNOSIS — R293 Abnormal posture: Secondary | ICD-10-CM | POA: Diagnosis not present

## 2024-01-17 DIAGNOSIS — M6281 Muscle weakness (generalized): Secondary | ICD-10-CM

## 2024-01-17 DIAGNOSIS — M542 Cervicalgia: Secondary | ICD-10-CM | POA: Diagnosis not present

## 2024-01-19 ENCOUNTER — Ambulatory Visit (INDEPENDENT_AMBULATORY_CARE_PROVIDER_SITE_OTHER)

## 2024-01-19 DIAGNOSIS — M542 Cervicalgia: Secondary | ICD-10-CM

## 2024-01-19 DIAGNOSIS — M6281 Muscle weakness (generalized): Secondary | ICD-10-CM

## 2024-01-19 DIAGNOSIS — R293 Abnormal posture: Secondary | ICD-10-CM | POA: Diagnosis not present

## 2024-01-19 NOTE — Therapy (Signed)
 OUTPATIENT PHYSICAL THERAPY CERVICAL TREATMENT   Patient Name: Martha Manning MRN: [id number] DOB:1955-10-03, 68 y.o., female Today's Date: 01/17/24  END OF SESSION:  PT End of Session - 01/19/24 1652     Visit Number 3    Number of Visits 16    Date for PT Re-Evaluation 03/02/24    Authorization Type [employer]    Authorization - Number of Visits 30    Progress Note Due on Visit 16    PT Start Time 1601    PT Stop Time 1642    PT Time Calculation (min) 41 min    Activity Tolerance Patient tolerated treatment well    Behavior During Therapy WFL for tasks assessed/performed            Past Medical History:  Diagnosis Date   Allergy    Arthritis    right hip (10/08/2015)   GERD (gastroesophageal reflux disease)    depends on diet    Hypertension    Past Surgical History:  Procedure Laterality Date   DENTAL SURGERY     TOTAL HIP ARTHROPLASTY Right 10/08/2015   Procedure: RIGHT TOTAL HIP ARTHROPLASTY ANTERIOR APPROACH;  Surgeon: Lonni CINDERELLA Poli, MD;  Location: [facility] OR;  Service: Orthopedics;  Laterality: Right;   VAGINAL DELIVERY     41 yrs ago   Patient Active Problem List   Diagnosis Date Noted   Neck pain 12/22/2022   Former smoker 08/20/2022   Influenza vaccine refused 06/07/2020   Overweight (BMI 25.0-29.9) 06/07/2020   Obesity (BMI 30-39.9) 05/04/2019   Mixed hyperlipidemia 05/04/2019   Sinus congestion 05/04/2019   Gastroesophageal reflux disease without esophagitis 07/26/2018   Diabetes mellitus screening 12/21/2016   Status post total replacement of right hip 10/08/2015   Ganglion of left wrist 05/16/2015   Essential hypertension 03/14/2014   Osteoarthritis of right hip 03/14/2014   Smoking 03/14/2014    PCP: Barnie WENDI Louder, MD  REFERRING PROVIDER: Ronal Dragon Persons, PA  REFERRING DIAG: M54.2 (ICD-10-CM) - Pain in neck  THERAPY DIAG:  Abnormal posture  Muscle weakness (generalized)  Cervicalgia  Rationale for Evaluation and  Treatment: Rehabilitation  ONSET DATE: Chronic, about a year  SUBJECTIVE:                                                                                                                                                                                                         SUBJECTIVE STATEMENT: Pt reports neck feels stiff, but turning L while driving is better.  Eval:  Karie notes Right upper shoulder/trap pain of a  year duration.   She is very affected by weather.  Spasm and pain at all times in now the norm.  She is unable to sleep on her right side and feels as if she is far more disabled than she should be.    PERTINENT HISTORY:  OA, HTN, Rt THA 10/08/2015, former smoker  PAIN:  Are you having pain? Yes: NPRS scale: 6/10  Pain location: Rt upper trap and cervical spine Pain description: Ache, constant throb, spasm Aggravating factors: Lie on the right side, weather, checking mirrors in the car, prolonged sitting Relieving factors: Cortisone shot  PRECAUTIONS: Cervical  RED FLAGS: None     WEIGHT BEARING RESTRICTIONS: No  FALLS:  Has patient fallen in last 6 months? 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  NEXT MD VISIT: 01/25/2024  OBJECTIVE:  Note: Objective measures were completed at Evaluation unless otherwise noted.  DIAGNOSTIC FINDINGS:  2 views of her cervical spine demonstrate straightening of the normal  lordosis.  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  Cannot appreciate any acute fractures  PATIENT SURVEYS:  PSFS: THE PATIENT SPECIFIC FUNCTIONAL SCALE  Place score of  0-10 (0 = unable to perform activity and 10 = able to perform activity at the same level as before injury or problem)  Activity Date: 01/06/2024    Check mirrors when driving 8/89    2.  Quick cervical active range of motion movements 0/10    3.  Reaching or overhead function 1/10    4.  Sleeping on the right side 0/10    Total Score 0.5      Total Score = Sum of activity scores/number of activities  Minimally Detectable Change: 3 points (for single activity); 2 points (for average score)  [facility] (nd). The Patient Specific Functional Scale . Retrieved from [email or web address]   COGNITION: Overall cognitive status: Within functional limits for tasks assessed  SENSATION: WFL  POSTURE: rounded shoulders, forward head, and decreased lumbar lordosis   CERVICAL ROM:   Active ROM A/PROM (deg) eval  Flexion   Extension 60  Right lateral flexion 30  Left lateral flexion 20  Right rotation 30  Left rotation 25   (Blank rows = not tested)  UPPER EXTREMITY ROM:  Active ROM Right eval Left eval  Shoulder flexion    Shoulder extension    Shoulder abduction    Shoulder adduction    Shoulder extension    Shoulder internal rotation    Shoulder external rotation    Elbow flexion    Elbow extension    Wrist flexion    Wrist extension    Wrist ulnar deviation    Wrist radial deviation    Wrist pronation    Wrist supination     (Blank rows = not tested)  UPPER EXTREMITY STRENGTH:  In pounds assessed with hand-held dynamometer Left/Right 01/06/2024   Shoulder flexion    Shoulder extension    Shoulder abduction    Shoulder adduction    Shoulder extension    Shoulder internal rotation    Shoulder external rotation    Middle trapezius    Lower trapezius  Elbow flexion    Elbow extension    Wrist flexion    Wrist extension    Wrist ulnar deviation    Wrist radial deviation    Wrist pronation     Wrist supination    Grip strength    Cervical Extension 16.5 (Goal 30)   Cervical Lateral Bending 14.1/8.4 (Goal 18)    (Blank rows = not tested)   TREATMENT DATE:  01/19/24 Neuro Re Ed: Scapular retraction/shoulder blade pinches 10 x 5 seconds followed by Tband rows Red 2x10 Cervical rotation supine active range of motion with shoulders pulled back 10 x 5 seconds Cervical extension isometrics 1 setseated and 1 set supine10 x 5 seconds                    There ex:  Backward shoulder rolls 2x10 UBE 2.5 min backwards for posture Cervical rotations on pillow 2x10  Manual treatment: SOR Manual traction with UT stretching Soft tissue mobilization    01/17/24  There ex:  Backward shoulder rolls 2x10 UBE 2 min backwards Neuro Re Ed: Scapular retraction/shoulder blade pinches 10 x 5 seconds Cervical rotation active range of motion with shoulders pulled back 10 x 5 seconds Cervical extension isometrics 10 x 5 seconds                     Trial of ball extensions Cervical rotations on pillow 2x10  Manual treatment: SOR Manual traction with UT stretching Soft tissue mobilization     01/06/2024 Scapular retraction/shoulder blade pinches 10 x 5 seconds Cervical rotation active range of motion with shoulders pulled back 10 x 5 seconds Cervical extension isometrics 10 x 5 seconds                      97535: Reviewed exam findings, imaging with spine model, postural basics and day 1 home exercise program.  Answered questions about massage and topical pain relievers.                                                                                                            PATIENT EDUCATION:  Education details: See above Person educated: Patient Education method: Explanation, Demonstration, Tactile cues, Verbal cues, and Handouts Education comprehension: verbalized understanding, returned demonstration, verbal cues required, tactile cues required, and needs further  education  HOME EXERCISE PROGRAM: Access Code: [email or web address] URL: [email or web address] Date: 01/06/2024 Prepared by: Lamar Ivory  Exercises - Standing Scapular Retraction  - 5 x daily - 7 x weekly - 1 sets - 5 reps - 5 second hold - Seated Cervical Rotation AROM  - 3-5 x daily - 7 x weekly - 1 sets - 10 reps - 5 seconds hold - Standing Isometric Cervical Extension with Manual Resistance  - 5 x daily - 7 x weekly - 1 sets - 5 reps - 5 hold  ASSESSMENT:  CLINICAL IMPRESSION: Pt continues to demonstrate apprehension and decreased mobility in rotation, even in supine.   After manual treatment and withvVC pt able to inc  rotation in supine.  Eval: Patient is a 68 y.o. female who was seen today for physical therapy evaluation and treatment for M54.2 (ICD-10-CM) - Pain in neck.  Molly has a 1 year history of chronic neck pain with symptoms worsening from being aggravating with poor weather to constant spasm.  She notes difficulty sleeping on the right side, turning her head to check her mirrors and with reaching and overhead function.  Examination showed significant limitations in cervical active range of motion, flexibility, cervical and postural strength.  Her prognosis to meet the below listed goals is good with the recommended plan of care.  OBJECTIVE IMPAIRMENTS: decreased activity tolerance, decreased endurance, decreased knowledge of condition, decreased ROM, decreased strength, impaired perceived functional ability, increased muscle spasms, impaired flexibility, impaired UE functional use, improper body mechanics, postural dysfunction, and pain.   ACTIVITY LIMITATIONS: carrying, lifting, sleeping, and reach over head  PARTICIPATION LIMITATIONS: community activity and occupation  PERSONAL FACTORS: OA, HTN, Rt THA 10/08/2015, former smoker are also affecting patient's functional outcome.   REHAB POTENTIAL: Good  CLINICAL DECISION MAKING: Stable/uncomplicated  EVALUATION  COMPLEXITY: Low   GOALS: Goals reviewed with patient? Yes  SHORT TERM GOALS: Target date: 02/03/2024  Laxmi will be independent with her day 1 home exercise program Baseline: Started 01/06/2024 Goal status: INITIAL  2.  Improve cervical active range of motion for extension to 65; lateral bending to 25 and rotation to 50 degrees Baseline: 60; 20/30 and 25/30 Goal status: INITIAL  3.  Improve cervical extension strength to at least 25 pounds Baseline: 16.5 pounds Goal status: INITIAL   LONG TERM GOALS: Target date: 03/02/2024   Improve patient specific functional score to at least 5 Baseline: 0.5 Goal status: INITIAL  2.  Tyeesha will report neck and right upper quadrant pain as consistently 2-4/10 or better on the visual analog scale Baseline: 4-7/10 Goal status: INITIAL  3.  Improve cervical rotation active range of motion to 60 degrees Baseline: 25/30 degrees Goal status: INITIAL  4.  Improve cervical extension strength to at least 30 pounds and lateral bending to at least 18 pounds Baseline: 16.5 pounds and 14.1/8.4 pounds respectively Goal status: INITIAL  5.  Trena will be independent with a long-term maintenance home exercise program at discharge Baseline: 01/06/2024 Goal status: INITIAL   PLAN:  PT FREQUENCY: 1-2x/week  PT DURATION: 8 weeks  PLANNED INTERVENTIONS: 97110-Therapeutic exercises, 97530- Therapeutic activity, [id number]- Neuromuscular re-education, 97535- Self Care, 02859- Manual therapy, 97012- Traction (mechanical), 20560 (1-2 muscles), 20561 (3+ muscles)- Dry Needling, Patient/Family education, Spinal mobilization, Cryotherapy, and Moist heat  PLAN FOR NEXT SESSION: Increase shoulder ROM exercises.   Burnard CHRISTELLA Meth, PT, MPT 01/19/2024, 4:54 PM

## 2024-01-25 ENCOUNTER — Encounter: Admitting: Physical Medicine and Rehabilitation

## 2024-01-25 NOTE — Therapy (Signed)
 OUTPATIENT PHYSICAL THERAPY CERVICAL TREATMENT   Patient Name: PETRICE BEEDY MRN: [id number] DOB:December 24, 1955, 68 y.o., female Today's Date: 01/17/24  END OF SESSION:***      Past Medical History:  Diagnosis Date   Allergy    Arthritis    right hip (10/08/2015)   GERD (gastroesophageal reflux disease)    depends on diet    Hypertension    Past Surgical History:  Procedure Laterality Date   DENTAL SURGERY     TOTAL HIP ARTHROPLASTY Right 10/08/2015   Procedure: RIGHT TOTAL HIP ARTHROPLASTY ANTERIOR APPROACH;  Surgeon: Lonni CINDERELLA Poli, MD;  Location: [facility] OR;  Service: Orthopedics;  Laterality: Right;   VAGINAL DELIVERY     41 yrs ago   Patient Active Problem List   Diagnosis Date Noted   Neck pain 12/22/2022   Former smoker 08/20/2022   Influenza vaccine refused 06/07/2020   Overweight (BMI 25.0-29.9) 06/07/2020   Obesity (BMI 30-39.9) 05/04/2019   Mixed hyperlipidemia 05/04/2019   Sinus congestion 05/04/2019   Gastroesophageal reflux disease without esophagitis 07/26/2018   Diabetes mellitus screening 12/21/2016   Status post total replacement of right hip 10/08/2015   Ganglion of left wrist 05/16/2015   Essential hypertension 03/14/2014   Osteoarthritis of right hip 03/14/2014   Smoking 03/14/2014    PCP: Barnie WENDI Louder, MD  REFERRING PROVIDER: Ronal Dragon Persons, PA  REFERRING DIAG: M54.2 (ICD-10-CM) - Pain in neck  THERAPY DIAG:  No diagnosis found.  Rationale for Evaluation and Treatment: Rehabilitation  ONSET DATE: Chronic, about a year  SUBJECTIVE:                                                                                                                                                                                                         SUBJECTIVE STATEMENT: ***Pt reports neck feels stiff, but turning L while driving is better.  Eval:  Miyoshi notes Right upper shoulder/trap pain of a year duration.   She is very affected by  weather.  Spasm and pain at all times in now the norm.  She is unable to sleep on her right side and feels as if she is far more disabled than she should be.    PERTINENT HISTORY:  OA, HTN, Rt THA 10/08/2015, former smoker  PAIN:  ***Are you having pain? Yes: NPRS scale: 6/10  Pain location: Rt upper trap and cervical spine Pain description: Ache, constant throb, spasm Aggravating factors: Lie on the right side, weather, checking mirrors in the car, prolonged sitting Relieving factors: Cortisone shot  PRECAUTIONS: Cervical  RED  FLAGS: None     WEIGHT BEARING RESTRICTIONS: No  FALLS:  Has patient fallen in last 6 months? 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  NEXT MD VISIT: 01/25/2024  OBJECTIVE:  Note: Objective measures were completed at Evaluation unless otherwise noted.  DIAGNOSTIC FINDINGS:  2 views of her cervical spine demonstrate straightening of the normal  lordosis.  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  Cannot appreciate any acute fractures  PATIENT SURVEYS:  PSFS: THE PATIENT SPECIFIC FUNCTIONAL SCALE  Place score of 0-10 (0 = unable to perform activity and 10 = able to perform activity at the same level as before injury or problem)  Activity Date: 01/06/2024    Check mirrors when driving 8/89    2.  Quick cervical active range of motion movements 0/10    3.  Reaching or overhead function 1/10    4.  Sleeping on the right side 0/10    Total Score 0.5      Total Score = Sum of activity scores/number of activities  Minimally Detectable Change: 3 points (for single  activity); 2 points (for average score)  [facility] (nd). The Patient Specific Functional Scale . Retrieved from [email or web address]   COGNITION: Overall cognitive status: Within functional limits for tasks assessed  SENSATION: WFL  POSTURE: rounded shoulders, forward head, and decreased lumbar lordosis   CERVICAL ROM:   Active ROM A/PROM (deg) eval  Flexion   Extension 60  Right lateral flexion 30  Left lateral flexion 20  Right rotation 30  Left rotation 25   (Blank rows = not tested)  UPPER EXTREMITY ROM:  Active ROM Right eval Left eval  Shoulder flexion    Shoulder extension    Shoulder abduction    Shoulder adduction    Shoulder extension    Shoulder internal rotation    Shoulder external rotation    Elbow flexion    Elbow extension    Wrist flexion    Wrist extension    Wrist ulnar deviation    Wrist radial deviation    Wrist pronation    Wrist supination     (Blank rows = not tested)  UPPER EXTREMITY STRENGTH:  In pounds assessed with hand-held dynamometer Left/Right 01/06/2024   Shoulder flexion    Shoulder extension    Shoulder abduction    Shoulder adduction    Shoulder extension    Shoulder internal rotation    Shoulder external rotation    Middle trapezius    Lower trapezius    Elbow flexion    Elbow extension    Wrist flexion    Wrist extension    Wrist ulnar deviation    Wrist radial deviation    Wrist pronation    Wrist supination    Grip strength    Cervical Extension 16.5 (Goal 30)   Cervical Lateral Bending 14.1/8.4 (Goal 18)    (Blank rows = not tested)   TREATMENT DATE:  01/26/24***       01/19/24 Neuro Re Ed: Scapular retraction/shoulder blade pinches 10 x 5 seconds followed by Tband rows Red 2x10 Cervical rotation supine active range of motion with  shoulders pulled back 10 x 5 seconds Cervical extension isometrics 1 setseated and 1 set supine10 x 5  seconds                    There ex:  Backward shoulder rolls 2x10 UBE 2.5 min backwards for posture Cervical rotations on pillow 2x10  Manual treatment: SOR Manual traction with UT stretching Soft tissue mobilization    01/17/24  There ex:  Backward shoulder rolls 2x10 UBE 2 min backwards Neuro Re Ed: Scapular retraction/shoulder blade pinches 10 x 5 seconds Cervical rotation active range of motion with shoulders pulled back 10 x 5 seconds Cervical extension isometrics 10 x 5 seconds                     Trial of ball extensions Cervical rotations on pillow 2x10  Manual treatment: SOR Manual traction with UT stretching Soft tissue mobilization     01/06/2024 Scapular retraction/shoulder blade pinches 10 x 5 seconds Cervical rotation active range of motion with shoulders pulled back 10 x 5 seconds Cervical extension isometrics 10 x 5 seconds                      97535: Reviewed exam findings, imaging with spine model, postural basics and day 1 home exercise program.  Answered questions about massage and topical pain relievers.                                                                                                            PATIENT EDUCATION:  Education details: See above Person educated: Patient Education method: Explanation, Demonstration, Tactile cues, Verbal cues, and Handouts Education comprehension: verbalized understanding, returned demonstration, verbal cues required, tactile cues required, and needs further education  HOME EXERCISE PROGRAM: Access Code: [email or web address] URL: [email or web address] Date: 01/06/2024 Prepared by: Lamar Ivory  Exercises - Standing Scapular Retraction  - 5 x daily - 7 x weekly - 1 sets - 5 reps - 5 second hold - Seated Cervical Rotation AROM  - 3-5 x daily - 7 x weekly - 1 sets - 10 reps - 5 seconds hold - Standing Isometric Cervical Extension with Manual Resistance  - 5 x daily - 7 x weekly - 1 sets - 5 reps  - 5 hold  ASSESSMENT:  CLINICAL IMPRESSION: ***Pt continues to demonstrate apprehension and decreased mobility in rotation, even in supine.   After manual treatment and withvVC pt able to inc rotation in supine.  Eval: Patient is a 68 y.o. female who was seen today for physical therapy evaluation and treatment for M54.2 (ICD-10-CM) - Pain in neck.  Molly has a 1 year history of chronic neck pain with symptoms worsening from being aggravating with poor weather to constant spasm.  She notes difficulty sleeping on the right side, turning her head to check her mirrors and with reaching and overhead function.  Examination showed significant limitations in cervical active range of motion, flexibility, cervical and postural strength.  Her prognosis to meet the below listed goals is  good with the recommended plan of care.  OBJECTIVE IMPAIRMENTS: decreased activity tolerance, decreased endurance, decreased knowledge of condition, decreased ROM, decreased strength, impaired perceived functional ability, increased muscle spasms, impaired flexibility, impaired UE functional use, improper body mechanics, postural dysfunction, and pain.   ACTIVITY LIMITATIONS: carrying, lifting, sleeping, and reach over head  PARTICIPATION LIMITATIONS: community activity and occupation  PERSONAL FACTORS: OA, HTN, Rt THA 10/08/2015, former smoker are also affecting patient's functional outcome.   REHAB POTENTIAL: Good  CLINICAL DECISION MAKING: Stable/uncomplicated  EVALUATION COMPLEXITY: Low   GOALS: Goals reviewed with patient? Yes  SHORT TERM GOALS: Target date: 02/03/2024  Daziya will be independent with her day 1 home exercise program Baseline: Started 01/06/2024 Goal status: INITIAL  2.  Improve cervical active range of motion for extension to 65; lateral bending to 25 and rotation to 50 degrees Baseline: 60; 20/30 and 25/30 Goal status: INITIAL  3.  Improve cervical extension strength to at least 25  pounds Baseline: 16.5 pounds Goal status: INITIAL   LONG TERM GOALS: Target date: 03/02/2024   Improve patient specific functional score to at least 5 Baseline: 0.5 Goal status: INITIAL  2.  Brecklyn will report neck and right upper quadrant pain as consistently 2-4/10 or better on the visual analog scale Baseline: 4-7/10 Goal status: INITIAL  3.  Improve cervical rotation active range of motion to 60 degrees Baseline: 25/30 degrees Goal status: INITIAL  4.  Improve cervical extension strength to at least 30 pounds and lateral bending to at least 18 pounds Baseline: 16.5 pounds and 14.1/8.4 pounds respectively Goal status: INITIAL  5.  Mckennah will be independent with a long-term maintenance home exercise program at discharge Baseline: 01/06/2024 Goal status: INITIAL   PLAN:  PT FREQUENCY: 1-2x/week  PT DURATION: 8 weeks  PLANNED INTERVENTIONS: 97110-Therapeutic exercises, 97530- Therapeutic activity, [id number]- Neuromuscular re-education, 97535- Self Care, 02859- Manual therapy, 97012- Traction (mechanical), 20560 (1-2 muscles), 20561 (3+ muscles)- Dry Needling, Patient/Family education, Spinal mobilization, Cryotherapy, and Moist heat  PLAN FOR NEXT SESSION: ****Increase shoulder ROM exercises.   Burnard CHRISTELLA Meth, PT, MPT 01/25/2024, 3:30 PM

## 2024-01-26 ENCOUNTER — Ambulatory Visit (INDEPENDENT_AMBULATORY_CARE_PROVIDER_SITE_OTHER)

## 2024-01-26 DIAGNOSIS — M542 Cervicalgia: Secondary | ICD-10-CM | POA: Diagnosis not present

## 2024-01-26 DIAGNOSIS — R293 Abnormal posture: Secondary | ICD-10-CM | POA: Diagnosis not present

## 2024-01-26 DIAGNOSIS — M6281 Muscle weakness (generalized): Secondary | ICD-10-CM

## 2024-01-28 ENCOUNTER — Ambulatory Visit (INDEPENDENT_AMBULATORY_CARE_PROVIDER_SITE_OTHER): Admitting: Physical Therapy

## 2024-01-28 ENCOUNTER — Encounter: Payer: Self-pay | Admitting: Physical Therapy

## 2024-01-28 DIAGNOSIS — M542 Cervicalgia: Secondary | ICD-10-CM

## 2024-01-28 DIAGNOSIS — M6281 Muscle weakness (generalized): Secondary | ICD-10-CM

## 2024-01-28 DIAGNOSIS — R293 Abnormal posture: Secondary | ICD-10-CM

## 2024-01-28 NOTE — Therapy (Signed)
 OUTPATIENT PHYSICAL THERAPY CERVICAL TREATMENT   Patient Name: Martha Manning MRN: [id number] DOB:1955-10-13, 68 y.o., female Today's Date: 01/17/24  END OF SESSION:  PT End of Session - 01/28/24 1546     Visit Number 5    Number of Visits 16    Date for PT Re-Evaluation 03/02/24    Authorization Type [employer]    Authorization - Number of Visits 30    Progress Note Due on Visit 16    PT Start Time 1518    PT Stop Time 1556    PT Time Calculation (min) 38 min    Activity Tolerance Patient tolerated treatment well    Behavior During Therapy WFL for tasks assessed/performed              Past Medical History:  Diagnosis Date   Allergy    Arthritis    right hip (10/08/2015)   GERD (gastroesophageal reflux disease)    depends on diet    Hypertension    Past Surgical History:  Procedure Laterality Date   DENTAL SURGERY     TOTAL HIP ARTHROPLASTY Right 10/08/2015   Procedure: RIGHT TOTAL HIP ARTHROPLASTY ANTERIOR APPROACH;  Surgeon: Martha CINDERELLA Poli, MD;  Location: [facility] OR;  Service: Orthopedics;  Laterality: Right;   VAGINAL DELIVERY     41 yrs ago   Patient Active Problem List   Diagnosis Date Noted   Neck pain 12/22/2022   Former smoker 08/20/2022   Influenza vaccine refused 06/07/2020   Overweight (BMI 25.0-29.9) 06/07/2020   Obesity (BMI 30-39.9) 05/04/2019   Mixed hyperlipidemia 05/04/2019   Sinus congestion 05/04/2019   Gastroesophageal reflux disease without esophagitis 07/26/2018   Diabetes mellitus screening 12/21/2016   Status post total replacement of right hip 10/08/2015   Ganglion of left wrist 05/16/2015   Essential hypertension 03/14/2014   Osteoarthritis of right hip 03/14/2014   Smoking 03/14/2014    PCP: Martha WENDI Louder, MD  REFERRING PROVIDER: Ronal Dragon Persons, PA  REFERRING DIAG: M54.2 (ICD-10-CM) - Pain in neck  THERAPY DIAG:  Abnormal posture  Muscle weakness (generalized)  Cervicalgia  Rationale for Evaluation and  Treatment: Rehabilitation  ONSET DATE: Chronic, about a year  SUBJECTIVE:                                                                                                                                                                                                         SUBJECTIVE STATEMENT:  Neck has been bothering me so much recently, had to leave work. Still getting a lot of mm spasms up  into the back of my head, I hurt and ache every day for every hour of the day, its just a question of how intense the hurt will be.   Eval:  [name] notes Right upper shoulder/trap pain of a year duration.   She is very affected by weather.  Spasm and pain at all times in now the norm.  She is unable to sleep on her right side and feels as if she is far more disabled than she should be.    PERTINENT HISTORY:  OA, HTN, Rt THA 10/08/2015, former smoker  PAIN:  Are you having pain? Yes: NPRS scale: 7/10  Pain location: Rt upper trap and cervical spine Pain description: Ache, constant throb, spasm Aggravating factors: Lie on the right side, weather, checking mirrors in the car, prolonged sitting Relieving factors: Cortisone shot  PRECAUTIONS: Cervical  RED FLAGS: None     WEIGHT BEARING RESTRICTIONS: No  FALLS:  Has patient fallen in last 6 months? 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  NEXT MD VISIT: 01/25/2024  OBJECTIVE:  Note: Objective measures were completed at Evaluation unless otherwise noted.  DIAGNOSTIC FINDINGS:  2 views of her cervical spine demonstrate straightening of the normal  lordosis.  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  Cannot appreciate any acute fractures  PATIENT SURVEYS:  PSFS: THE PATIENT SPECIFIC FUNCTIONAL SCALE  Place score of 0-10 (0 = unable to perform activity and 10 = able to perform activity at the same level as before injury or problem)  Activity Date: 01/06/2024    Check mirrors when driving 8/89    2.  Quick cervical active range of motion movements 0/10    3.  Reaching or overhead function 1/10    4.  Sleeping on the right side 0/10    Total Score 0.5      Total Score = Sum of activity scores/number of activities  Minimally Detectable Change: 3 points (for single activity); 2 points (for average score)  [facility] (nd). The Patient Specific Functional Scale . Retrieved from [email or web address]   COGNITION: Overall cognitive status: Within functional limits for tasks assessed  SENSATION: WFL  POSTURE: rounded shoulders, forward head, and decreased lumbar lordosis   CERVICAL ROM:   Active ROM A/PROM (deg) eval  Flexion   Extension 60  Right lateral flexion 30  Left lateral flexion 20  Right rotation 30  Left rotation 25   (Blank rows = not tested)  UPPER EXTREMITY ROM:  Active ROM Right eval Left eval  Shoulder flexion    Shoulder extension    Shoulder abduction    Shoulder adduction    Shoulder extension    Shoulder internal rotation    Shoulder external rotation    Elbow flexion    Elbow extension    Wrist flexion    Wrist extension    Wrist ulnar deviation    Wrist radial deviation    Wrist pronation    Wrist supination     (Blank rows = not tested)  UPPER EXTREMITY STRENGTH:  In pounds assessed with hand-held dynamometer Left/Right 01/06/2024   Shoulder flexion  Shoulder extension    Shoulder abduction    Shoulder adduction    Shoulder extension    Shoulder internal rotation    Shoulder external rotation    Middle trapezius     Lower trapezius    Elbow flexion    Elbow extension    Wrist flexion    Wrist extension    Wrist ulnar deviation    Wrist radial deviation    Wrist pronation    Wrist supination    Grip strength    Cervical Extension 16.5 (Goal 30)   Cervical Lateral Bending 14.1/8.4 (Goal 18)    (Blank rows = not tested)   TREATMENT DATE:   01/28/24  UBE x5 min backwards for posture  Supine UT stretch 2x30 seconds B Suboccipital release 4x30 seconds, gave tennis balls for home use Chin tucks 12x3 seconds  Chin tucks + rotation x10 B Chin tucks + lateral flexion x10 B Scap retractions green TB x15  Shoulder extensions green TB x15    01/26/24  [id number] There ex:  Backward shoulder rolls 2x10 UBE 3 min backwards for posture Supine AROM on pillow 20x Neuro Re Ed: Scapular retraction/shoulder blade pinches 10 x 5 seconds followed by Tband rows Green 3x10 Tband extension green 3x10 Supine cervical retractions 2x10 hold 4 sec Manual treatment: SOR Manual traction with UT stretching Soft tissue mobilization  01/19/24 Neuro Re Ed: Scapular retraction/shoulder blade pinches 10 x 5 seconds followed by Tband rows Red 2x10 Cervical rotation supine active range of motion with shoulders pulled back 10 x 5 seconds Cervical extension isometrics 1 setseated and 1 set supine10 x 5 seconds                    There ex:  Backward shoulder rolls 2x10 UBE 2.5 min backwards for posture Cervical rotations on pillow 2x10  Manual treatment: SOR Manual traction with UT stretching Soft tissue mobilization    01/17/24  There ex:  Backward shoulder rolls 2x10 UBE 2 min backwards Neuro Re Ed: Scapular retraction/shoulder blade pinches 10 x 5 seconds Cervical rotation active range of motion with shoulders pulled back 10 x 5 seconds Cervical extension isometrics 10 x 5 seconds                     Trial of ball extensions Cervical rotations on pillow 2x10  Manual treatment: SOR Manual  traction with UT stretching Soft tissue mobilization     01/06/2024 Scapular retraction/shoulder blade pinches 10 x 5 seconds Cervical rotation active range of motion with shoulders pulled back 10 x 5 seconds Cervical extension isometrics 10 x 5 seconds                      [id number]: Reviewed exam findings, imaging with spine model, postural basics and day 1 home exercise program.  Answered questions about massage and topical pain relievers.  PATIENT EDUCATION:  Education details: See above Person educated: Patient Education method: Explanation, Demonstration, Tactile cues, Verbal cues, and Handouts Education comprehension: verbalized understanding, returned demonstration, verbal cues required, tactile cues required, and needs further education  HOME EXERCISE PROGRAM: Access Code: [email or web address] URL: [email or web address] Date: 01/06/2024 Prepared by: Lamar Ivory  Exercises - Standing Scapular Retraction  - 5 x daily - 7 x weekly - 1 sets - 5 reps - 5 second hold - Seated Cervical Rotation AROM  - 3-5 x daily - 7 x weekly - 1 sets - 10 reps - 5 seconds hold - Standing Isometric Cervical Extension with Manual Resistance  - 5 x daily - 7 x weekly - 1 sets - 5 reps - 5 hold  ASSESSMENT:  CLINICAL IMPRESSION:  Arrives today still having a lot of neck pain- had to leave work early a couple times this week due to this. Worked on cervical ROM and soft tissue limitations as much as able today. Seemed to benefit from suboccipital release, educated on how to do this at home for some extra relief.    Eval: Patient is a 68 y.o. female who was seen today for physical therapy evaluation and treatment for M54.2 (ICD-10-CM) - Pain in neck.  Molly has a 1 year history of chronic neck pain with symptoms worsening from being aggravating with poor weather to constant spasm.  She notes  difficulty sleeping on the right side, turning her head to check her mirrors and with reaching and overhead function.  Examination showed significant limitations in cervical active range of motion, flexibility, cervical and postural strength.  Her prognosis to meet the below listed goals is good with the recommended plan of care.  OBJECTIVE IMPAIRMENTS: decreased activity tolerance, decreased endurance, decreased knowledge of condition, decreased ROM, decreased strength, impaired perceived functional ability, increased muscle spasms, impaired flexibility, impaired UE functional use, improper body mechanics, postural dysfunction, and pain.   ACTIVITY LIMITATIONS: carrying, lifting, sleeping, and reach over head  PARTICIPATION LIMITATIONS: community activity and occupation  PERSONAL FACTORS: OA, HTN, Rt THA 10/08/2015, former smoker are also affecting patient's functional outcome.   REHAB POTENTIAL: Good  CLINICAL DECISION MAKING: Stable/uncomplicated  EVALUATION COMPLEXITY: Low   GOALS: Goals reviewed with patient? Yes  SHORT TERM GOALS: Target date: 02/03/2024  Maiya will be independent with her day 1 home exercise program Baseline: Started 01/06/2024 Goal status: INITIAL  2.  Improve cervical active range of motion for extension to 65; lateral bending to 25 and rotation to 50 degrees Baseline: 60; 20/30 and 25/30 Goal status: INITIAL  3.  Improve cervical extension strength to at least 25 pounds Baseline: 16.5 pounds Goal status: INITIAL   LONG TERM GOALS: Target date: 03/02/2024   Improve patient specific functional score to at least 5 Baseline: 0.5 Goal status: INITIAL  2.  Ilynn will report neck and right upper quadrant pain as consistently 2-4/10 or better on the visual analog scale Baseline: 4-7/10 Goal status: INITIAL  3.  Improve cervical rotation active range of motion to 60 degrees Baseline: 25/30 degrees Goal status: INITIAL  4.  Improve cervical extension  strength to at least 30 pounds and lateral bending to at least 18 pounds Baseline: 16.5 pounds and 14.1/8.4 pounds respectively Goal status: INITIAL  5.  Marielis will be independent with a long-term maintenance home exercise program at discharge Baseline: 01/06/2024 Goal status: INITIAL   PLAN:  PT FREQUENCY: 1-2x/week  PT DURATION: 8 weeks  PLANNED INTERVENTIONS: 97110-Therapeutic exercises, 97530- Therapeutic activity, [id number]- Neuromuscular  re-education, [phone]- Self Care, 02859- Manual therapy, [id number]- Traction (mechanical), [phone] (1-2 muscles), [id number] (3+ muscles)- Dry Needling, Patient/Family education, Spinal mobilization, Cryotherapy, and Moist heat  PLAN FOR NEXT SESSION: Increase active R UT stretching and/or review. How is suboccipital release working out at home?    Josette Rough, PT, DPT 01/28/24 3:56 PM

## 2024-01-31 ENCOUNTER — Encounter: Payer: Self-pay | Admitting: Rehabilitative and Restorative Service Providers"

## 2024-01-31 ENCOUNTER — Ambulatory Visit (INDEPENDENT_AMBULATORY_CARE_PROVIDER_SITE_OTHER): Admitting: Rehabilitative and Restorative Service Providers"

## 2024-01-31 DIAGNOSIS — M542 Cervicalgia: Secondary | ICD-10-CM | POA: Diagnosis not present

## 2024-01-31 DIAGNOSIS — M6281 Muscle weakness (generalized): Secondary | ICD-10-CM

## 2024-01-31 DIAGNOSIS — R293 Abnormal posture: Secondary | ICD-10-CM

## 2024-01-31 NOTE — Therapy (Signed)
 OUTPATIENT PHYSICAL THERAPY CERVICAL TREATMENT   Patient Name: OVIYA AMMAR MRN: [id number] DOB:May 26, 1956, 68 y.o., female Today's Date: 01/17/24  END OF SESSION:  PT End of Session - 01/31/24 1557     Visit Number 6    Number of Visits 16    Date for PT Re-Evaluation 03/02/24    Authorization Type [employer]    Authorization - Number of Visits 30    Progress Note Due on Visit 16    PT Start Time 1557    PT Stop Time 1652    PT Time Calculation (min) 55 min    Activity Tolerance Patient tolerated treatment well;No increased pain;Patient limited by pain    Behavior During Therapy [facility] for tasks assessed/performed               Past Medical History:  Diagnosis Date   Allergy    Arthritis    right hip (10/08/2015)   GERD (gastroesophageal reflux disease)    depends on diet    Hypertension    Past Surgical History:  Procedure Laterality Date   DENTAL SURGERY     TOTAL HIP ARTHROPLASTY Right 10/08/2015   Procedure: RIGHT TOTAL HIP ARTHROPLASTY ANTERIOR APPROACH;  Surgeon: Lonni CINDERELLA Poli, MD;  Location: [facility] OR;  Service: Orthopedics;  Laterality: Right;   VAGINAL DELIVERY     41 yrs ago   Patient Active Problem List   Diagnosis Date Noted   Neck pain 12/22/2022   Former smoker 08/20/2022   Influenza vaccine refused 06/07/2020   Overweight (BMI 25.0-29.9) 06/07/2020   Obesity (BMI 30-39.9) 05/04/2019   Mixed hyperlipidemia 05/04/2019   Sinus congestion 05/04/2019   Gastroesophageal reflux disease without esophagitis 07/26/2018   Diabetes mellitus screening 12/21/2016   Status post total replacement of right hip 10/08/2015   Ganglion of left wrist 05/16/2015   Essential hypertension 03/14/2014   Osteoarthritis of right hip 03/14/2014   Smoking 03/14/2014    PCP: Barnie WENDI Louder, MD  REFERRING PROVIDER: Ronal Dragon Persons, PA  REFERRING DIAG: M54.2 (ICD-10-CM) - Pain in neck  THERAPY DIAG:  Abnormal posture  Muscle weakness  (generalized)  Cervicalgia  Rationale for Evaluation and Treatment: Rehabilitation  ONSET DATE: Chronic, about a year  SUBJECTIVE:                                                                                                                                                                                                         SUBJECTIVE STATEMENT:  Amberlie notes very good home exercise program compliance.  She does report she has  not been doing any walking as recommended as part of her home walking program.  Eval:  Lovelee notes Right upper shoulder/trap pain of a year duration.   She is very affected by weather.  Spasm and pain at all times in now the norm.  She is unable to sleep on her right side and feels as if she is far more disabled than she should be.    PERTINENT HISTORY:  OA, HTN, Rt THA 10/08/2015, former smoker  PAIN:  Are you having pain? Yes: NPRS scale: As high as 7/10  Pain location: Rt upper trap and cervical spine Pain description: Ache, constant throb, spasm Aggravating factors: Lie on the right side, weather, checking mirrors in the car, prolonged sitting Relieving factors: Cortisone shot  PRECAUTIONS: Cervical  RED FLAGS: None     WEIGHT BEARING RESTRICTIONS: No  FALLS:  Has patient fallen in last 6 months? 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  NEXT MD VISIT: 01/25/2024  OBJECTIVE:  Note: Objective measures were completed at Evaluation unless otherwise noted.  DIAGNOSTIC FINDINGS:  2 views of her cervical spine demonstrate straightening of the normal  lordosis.  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  Cannot appreciate any acute fractures  PATIENT SURVEYS:  PSFS: THE PATIENT SPECIFIC FUNCTIONAL SCALE  Place score of 0-10 (0 = unable to perform activity and 10 = able to perform activity at the same level as before injury or problem)  Activity Date: 01/06/2024 01/31/2024   Check mirrors when driving 8/89    2.  Quick cervical active range of motion movements 0/10    3.  Reaching or overhead function 1/10    4.  Sleeping on the right side 0/10    Total Score 0.5      Total Score = Sum of activity scores/number of activities  Minimally Detectable Change: 3 points (for single activity); 2 points (for average score)  [facility] (nd). The Patient Specific Functional Scale . Retrieved from [email or web address]   COGNITION: Overall cognitive status: Within functional limits for tasks assessed  SENSATION: WFL  POSTURE: rounded shoulders, forward head, and decreased lumbar lordosis   CERVICAL ROM:   Active ROM A/PROM (deg) eval AROM 01/31/2024  Flexion    Extension 60 70  Right lateral flexion 30 30  Left lateral flexion 20 25  Right rotation 30 30  Left rotation 25 35   (Blank rows = not tested)  UPPER EXTREMITY ROM:  Active ROM Right eval Left eval  Shoulder flexion    Shoulder extension    Shoulder abduction    Shoulder adduction    Shoulder extension    Shoulder internal rotation    Shoulder external rotation    Elbow flexion    Elbow extension    Wrist flexion    Wrist extension    Wrist ulnar deviation    Wrist radial deviation    Wrist pronation    Wrist supination     (Blank rows = not tested)  UPPER EXTREMITY STRENGTH:  In pounds assessed with hand-held dynamometer Left/Right 01/06/2024 Left/Right 01/31/2024  Shoulder flexion    Shoulder extension  Shoulder abduction    Shoulder adduction    Shoulder extension    Shoulder internal rotation     Shoulder external rotation    Middle trapezius    Lower trapezius    Elbow flexion    Elbow extension    Wrist flexion    Wrist extension    Wrist ulnar deviation    Wrist radial deviation    Wrist pronation    Wrist supination    Grip strength    Cervical Extension 16.5 (Goal 30) 27.2 pounds  Cervical Lateral Bending 14.1/8.4 (Goal 18) 13.9/10.5 pounds   (Blank rows = not tested)   TREATMENT DATE:  01/31/2024 UBE Pull only Level 4 :30 Pull/:30 rest for 5 minutes, 50 RPM goal Scapular retraction/shoulder blade pinches 10 x 5 seconds Scapular retraction/shoulder blade pinches with shoulder shrug 10 x 5 seconds Cervical rotation active range of motion with shoulders pulled back 10 x 5 seconds Cervical extension isometrics 10 x 5 seconds     Cervical lateral bending isometrics 5 x 5 seconds bilateral  Functional Activities (for postural correction):                               Green Thera-Band Rows/Pull to chest with scapular retraction 20 x 3 seconds Thera-band Shoulder ER Yellow with scapular retraction 10 x 3 seconds bilateral Thera-Band Extension Palms up Yellow 10 x 30 seconds  02464: Reviewed objective numbers from evaluation and compared them to today's reassessment; reviewed changes to her home exercise program  Ice cervical spine 10 minutes   01/28/24  UBE x5 min backwards for posture  Supine UT stretch 2x30 seconds B Suboccipital release 4x30 seconds, gave tennis balls for home use Chin tucks 12x3 seconds  Chin tucks + rotation x10 B Chin tucks + lateral flexion x10 B Scap retractions green TB x15  Shoulder extensions green TB x15    01/26/24  [id number] There ex:  Backward shoulder rolls 2x10 UBE 3 min backwards for posture Supine AROM on pillow 20x Neuro Re Ed: Scapular retraction/shoulder blade pinches 10 x 5 seconds followed by Tband rows Green 3x10 Tband extension green 3x10 Supine cervical retractions 2x10 hold 4 sec Manual treatment: SOR Manual  traction with UT stretching Soft tissue mobilization  PATIENT EDUCATION:  Education details: See above Person educated: Patient Education method: Explanation, Demonstration, Tactile cues, Verbal cues, and Handouts Education comprehension: verbalized understanding, returned demonstration, verbal cues required, tactile cues required, and needs further education  HOME EXERCISE PROGRAM: Access Code: [email or web address] URL: [email or web address] Date: 01/31/2024 Prepared by: Lamar Ivory  Exercises - Standing Scapular Retraction  - 5 x daily - 7 x weekly - 1 sets - 5 reps - 5 second hold - Seated Cervical Rotation AROM  - 3-5 x daily - 7 x weekly - 1 sets - 10 reps - 5 seconds hold - Standing Isometric Cervical Extension with Manual Resistance  - 5 x daily - 7 x weekly - 1 sets - 5 reps - 5 hold - Standing Isometric Cervical Sidebending with Manual Resistance  - 3-5 x daily - 7 x weekly - 1 sets - 10 reps - 5 hold - Standing Shoulder Row with Anchored Resistance  - 1 x daily - 7 x weekly - 1 sets - 20 reps - 3 seconds hold - Shoulder External Rotation with Anchored Resistance  - 1 x daily - 7 x weekly - 1 sets - 10 reps - 3 hold  Cervical rotation active range of motion with shoulders pulled back 10 x 5 seconds Cervical extension isometrics 10 x 5 seconds                      ASSESSMENT:  CLINICAL IMPRESSION: Judah notes some progress with her ability to check her mirrors, particularly to the left side.  Objective reassessment today showed improved cervical active range of motion and some strength progress.  Domino still has significant active range of motion, strength, posture and body mechanics impairments that will benefit from the recommended plan of care.  Today's updates to her home program should allow Molly to meet long-term goals in the next 1 to 2 months.  Eval: Patient is a 68 y.o. female who was seen today for physical therapy evaluation and treatment for M54.2 (ICD-10-CM)  - Pain in neck.  Molly has a 1 year history of chronic neck pain with symptoms worsening from being aggravating with poor weather to constant spasm.  She notes difficulty sleeping on the right side, turning her head to check her mirrors and with reaching and overhead function.  Examination showed significant limitations in cervical active range of motion, flexibility, cervical and postural strength.  Her prognosis to meet the below listed goals is good with the recommended plan of care.  OBJECTIVE IMPAIRMENTS: decreased activity tolerance, decreased endurance, decreased knowledge of condition, decreased ROM, decreased strength, impaired perceived functional ability, increased muscle spasms, impaired flexibility, impaired UE functional use, improper body mechanics, postural dysfunction, and pain.   ACTIVITY LIMITATIONS: carrying, lifting, sleeping, and reach over head  PARTICIPATION LIMITATIONS: community activity and occupation  PERSONAL FACTORS: OA, HTN, Rt THA 10/08/2015, former smoker are also affecting patient's functional outcome.   REHAB POTENTIAL: Good  CLINICAL DECISION MAKING: Stable/uncomplicated  EVALUATION COMPLEXITY: Low   GOALS: Goals reviewed with patient? Yes  SHORT TERM GOALS: Target date: 02/03/2024  Dorinda will be independent with her day 1 home exercise program Baseline: Started 01/06/2024 Goal status: Met 01/31/2024  2.  Improve cervical active range of motion for extension to 65; lateral bending to 25 and rotation to 50 degrees Baseline: 60; 20/30 and 25/30 Goal status: Partially met 01/31/2024  3.  Improve cervical extension strength to at least 25 pounds Baseline: 16.5 pounds Goal status: Met 01/31/2024   LONG TERM GOALS: Target date: 03/02/2024   Improve patient specific functional score to at least 5 Baseline: 0.5 Goal status: INITIAL  2.  Teruko will report neck and right upper quadrant pain as consistently 2-4/10 or better on the visual analog  scale Baseline: 4-7/10 Goal status: Ongoing 01/31/2024  3.  Improve cervical rotation active range of motion to 60 degrees Baseline: 25/30 degrees Goal status: Ongoing 01/31/2024  4.  Improve cervical extension strength to at least 30 pounds and lateral bending to at least 18 pounds Baseline: 16.5 pounds and 14.1/8.4 pounds respectively Goal status: Ongoing 01/31/2024  5.  Reggie will be independent with a long-term maintenance home exercise program at discharge Baseline: 01/06/2024 Goal status: INITIAL   PLAN:  PT FREQUENCY: 1-2x/week  PT DURATION: 8 weeks  PLANNED INTERVENTIONS: 97110-Therapeutic exercises, 97530- Therapeutic activity, [id number]- Neuromuscular re-education, 97535- Self Care, [id number]- Manual therapy, 97012- Traction (mechanical), 20560 (1-2 muscles), 20561 (3+ muscles)- Dry Needling, Patient/Family education, Spinal mobilization, Cryotherapy, and Moist heat  PLAN FOR NEXT SESSION: Appropriate scapular, cervical and postural strength progressions.  Body mechanics and postural education.  Has she been walking along with doing her current exercises multiple times per  day?   Myer LELON Ivory PT, MPT 01/31/24 5:05 PM

## 2024-02-03 ENCOUNTER — Encounter

## 2024-02-09 ENCOUNTER — Other Ambulatory Visit: Payer: Self-pay | Admitting: Physician Assistant

## 2024-02-09 ENCOUNTER — Other Ambulatory Visit: Payer: Self-pay | Admitting: Internal Medicine

## 2024-02-09 DIAGNOSIS — F5104 Psychophysiologic insomnia: Secondary | ICD-10-CM

## 2024-02-09 DIAGNOSIS — M62838 Other muscle spasm: Secondary | ICD-10-CM

## 2024-02-16 ENCOUNTER — Ambulatory Visit (INDEPENDENT_AMBULATORY_CARE_PROVIDER_SITE_OTHER)

## 2024-02-16 DIAGNOSIS — M6281 Muscle weakness (generalized): Secondary | ICD-10-CM | POA: Diagnosis not present

## 2024-02-16 DIAGNOSIS — R293 Abnormal posture: Secondary | ICD-10-CM | POA: Diagnosis not present

## 2024-02-16 DIAGNOSIS — M542 Cervicalgia: Secondary | ICD-10-CM | POA: Diagnosis not present

## 2024-02-16 NOTE — Therapy (Signed)
 OUTPATIENT PHYSICAL THERAPY CERVICAL TREATMENT   Patient Name: Martha Manning MRN: [id number] DOB:October 23, 1955, 68 y.o., female Today's Date: 01/17/24  END OF SESSION:  PT End of Session - 02/16/24 1515     Visit Number 7    Number of Visits 16    Date for PT Re-Evaluation 03/02/24    Authorization Type [employer]    Authorization - Number of Visits 30    Progress Note Due on Visit 16    PT Start Time 1430    PT Stop Time 1510    PT Time Calculation (min) 40 min    Activity Tolerance Patient tolerated treatment well    Behavior During Therapy WFL for tasks assessed/performed                Past Medical History:  Diagnosis Date   Allergy    Arthritis    right hip (10/08/2015)   GERD (gastroesophageal reflux disease)    depends on diet    Hypertension    Past Surgical History:  Procedure Laterality Date   DENTAL SURGERY     TOTAL HIP ARTHROPLASTY Right 10/08/2015   Procedure: RIGHT TOTAL HIP ARTHROPLASTY ANTERIOR APPROACH;  Surgeon: Lonni CINDERELLA Poli, MD;  Location: [facility] OR;  Service: Orthopedics;  Laterality: Right;   VAGINAL DELIVERY     41 yrs ago   Patient Active Problem List   Diagnosis Date Noted   Neck pain 12/22/2022   Former smoker 08/20/2022   Influenza vaccine refused 06/07/2020   Overweight (BMI 25.0-29.9) 06/07/2020   Obesity (BMI 30-39.9) 05/04/2019   Mixed hyperlipidemia 05/04/2019   Sinus congestion 05/04/2019   Gastroesophageal reflux disease without esophagitis 07/26/2018   Diabetes mellitus screening 12/21/2016   Status post total replacement of right hip 10/08/2015   Ganglion of left wrist 05/16/2015   Essential hypertension 03/14/2014   Osteoarthritis of right hip 03/14/2014   Smoking 03/14/2014    PCP: Barnie WENDI Louder, MD  REFERRING PROVIDER: Ronal Dragon Persons, PA  REFERRING DIAG: M54.2 (ICD-10-CM) - Pain in neck  THERAPY DIAG:  Abnormal posture  Muscle weakness (generalized)  Cervicalgia  Rationale for Evaluation  and Treatment: Rehabilitation  ONSET DATE: Chronic, about a year  SUBJECTIVE:                                                                                                                                                                                                         SUBJECTIVE STATEMENT:  Pt reports a day or 2 after her last visit she was over eager with her stretching and  exercises and hurt something.  She states the pain was severe and she had to take a few days to rest and not do anything.  Has not started on her walking program.  She states she is feeling better but feels like she has lost the motion she gained.   Eval:  Martha Manning notes Right upper shoulder/trap pain of a year duration.   She is very affected by weather.  Spasm and pain at all times in now the norm.  She is unable to sleep on her right side and feels as if she is far more disabled than she should be.    PERTINENT HISTORY:  OA, HTN, Rt THA 10/08/2015, former smoker  PAIN:  Are you having pain? Yes: NPRS scale: Today 5/10  Pain location: Rt upper trap and cervical spine Pain description: Ache, constant throb, spasm Aggravating factors: Lie on the right side, weather, checking mirrors in the car, prolonged sitting Relieving factors: Cortisone shot  PRECAUTIONS: Cervical  RED FLAGS: None     WEIGHT BEARING RESTRICTIONS: No  FALLS:  Has patient fallen in last 6 months? 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  NEXT MD VISIT: 01/25/2024  OBJECTIVE:  Note: Objective measures were completed at Evaluation unless otherwise noted.  DIAGNOSTIC FINDINGS:  2 views of her cervical spine demonstrate straightening of the normal  lordosis.   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  Cannot appreciate any acute fractures  PATIENT SURVEYS:  PSFS: THE PATIENT SPECIFIC FUNCTIONAL SCALE  Place score of 0-10 (0 = unable to perform activity and 10 = able to perform activity at the same level as before injury or problem)  Activity Date: 01/06/2024 01/31/2024   Check mirrors when driving 8/89    2.  Quick cervical active range of motion movements 0/10    3.  Reaching or overhead function 1/10    4.  Sleeping on the right side 0/10    Total Score 0.5      Total Score = Sum of activity scores/number of activities  Minimally Detectable Change: 3 points (for single activity); 2 points (for average score)  [facility] (nd). The Patient Specific Functional Scale . Retrieved from [email or web address]   COGNITION: Overall cognitive status: Within functional limits for tasks assessed  SENSATION: WFL  POSTURE: rounded shoulders, forward head, and decreased lumbar lordosis   CERVICAL ROM:   Active ROM A/PROM (deg) eval AROM 01/31/2024  Flexion    Extension 60 70  Right lateral flexion 30 30  Left lateral flexion 20 25  Right rotation 30 30  Left rotation 25 35   (Blank rows = not tested)  UPPER EXTREMITY ROM:  Active ROM Right eval Left eval  Shoulder flexion    Shoulder extension    Shoulder abduction    Shoulder adduction    Shoulder extension    Shoulder internal rotation    Shoulder external rotation    Elbow flexion    Elbow extension    Wrist flexion    Wrist extension    Wrist ulnar deviation    Wrist radial deviation    Wrist pronation    Wrist  supination     (Blank rows = not tested)  UPPER EXTREMITY STRENGTH:  In pounds assessed with hand-held dynamometer Left/Right 01/06/2024 Left/Right 01/31/2024  Shoulder flexion     Shoulder extension    Shoulder abduction    Shoulder adduction    Shoulder extension    Shoulder internal rotation    Shoulder external rotation    Middle trapezius    Lower trapezius    Elbow flexion    Elbow extension    Wrist flexion    Wrist extension    Wrist ulnar deviation    Wrist radial deviation    Wrist pronation    Wrist supination    Grip strength    Cervical Extension 16.5 (Goal 30) 27.2 pounds  Cervical Lateral Bending 14.1/8.4 (Goal 18) 13.9/10.5 pounds   (Blank rows = not tested)   TREATMENT DATE:  02/16/24 Full review of HEP Functional Act for posture Tband ER bilateral Red 3x10 Tband Rows Green 3x10 Tband Ext Green at [place]  There Ex  Supine cervical retractions Cervical rotations  Manual - PROM, UT stretching, SOR, STM to pvm and UT   01/31/2024 UBE Pull only Level 4 :30 Pull/:30 rest for 5 minutes, 50 RPM goal Scapular retraction/shoulder blade pinches 10 x 5 seconds Scapular retraction/shoulder blade pinches with shoulder shrug 10 x 5 seconds Cervical rotation active range of motion with shoulders pulled back 10 x 5 seconds Cervical extension isometrics 10 x 5 seconds     Cervical lateral bending isometrics 5 x 5 seconds bilateral  Functional Activities (for postural correction):                               Green Thera-Band Rows/Pull to chest with scapular retraction 20 x 3 seconds Thera-band Shoulder ER Yellow with scapular retraction 10 x 3 seconds bilateral Thera-Band Extension Palms up Yellow 10 x 30 seconds  [id number]: Reviewed objective numbers from evaluation and compared them to today's reassessment; reviewed changes to her home exercise program  Ice cervical spine 10 minutes   01/28/24  UBE x5 min backwards for posture  Supine UT stretch 2x30 seconds B Suboccipital release 4x30 seconds, gave tennis balls for home use Chin tucks 12x3 seconds  Chin tucks + rotation x10 B Chin tucks + lateral flexion x10 B Scap retractions green  TB x15  Shoulder extensions green TB x15    01/26/24  [id number] There ex:  Backward shoulder rolls 2x10 UBE 3 min backwards for posture Supine AROM on pillow 20x Neuro Re Ed: Scapular retraction/shoulder blade pinches 10 x 5 seconds followed by Tband rows Green 3x10 Tband extension green 3x10 Supine cervical retractions 2x10 hold 4 sec Manual treatment: SOR Manual traction with UT stretching Soft tissue mobilization  PATIENT EDUCATION:  Education details: See above Person educated: Patient Education method: Explanation, Demonstration, Tactile cues, Verbal cues, and Handouts Education comprehension: verbalized understanding, returned demonstration, verbal cues required, tactile cues required, and needs further education  HOME EXERCISE PROGRAM: Access Code: [email or web address] URL: [email or web address] Date: 01/31/2024 Prepared by: Martha Manning  Exercises - Standing Scapular Retraction  - 5 x daily - 7 x weekly - 1 sets - 5 reps - 5 second hold - Seated Cervical Rotation AROM  - 3-5 x daily - 7 x weekly - 1 sets - 10 reps - 5 seconds hold - Standing Isometric Cervical Extension with Manual Resistance  - 5 x daily - 7 x weekly - 1 sets -  5 reps - 5 hold - Standing Isometric Cervical Sidebending with Manual Resistance  - 3-5 x daily - 7 x weekly - 1 sets - 10 reps - 5 hold - Standing Shoulder Row with Anchored Resistance  - 1 x daily - 7 x weekly - 1 sets - 20 reps - 3 seconds hold - Shoulder External Rotation with Anchored Resistance  - 1 x daily - 7 x weekly - 1 sets - 10 reps - 3 hold Cervical rotation active range of motion with shoulders pulled back 10 x 5 seconds Cervical extension isometrics 10 x 5 seconds                      ASSESSMENT:  CLINICAL IMPRESSION: Pt apprehensive with cervical motion as we talked but showed improved motion at end of treatment.  Needed some VC and tactile cues with HEP especially Tband exercises.   Eval: Patient is a 68 y.o. female who  was seen today for physical therapy evaluation and treatment for M54.2 (ICD-10-CM) - Pain in neck.  Martha Manning has a 1 year history of chronic neck pain with symptoms worsening from being aggravating with poor weather to constant spasm.  She notes difficulty sleeping on the right side, turning her head to check her mirrors and with reaching and overhead function.  Examination showed significant limitations in cervical active range of motion, flexibility, cervical and postural strength.  Her prognosis to meet the below listed goals is good with the recommended plan of care.  OBJECTIVE IMPAIRMENTS: decreased activity tolerance, decreased endurance, decreased knowledge of condition, decreased ROM, decreased strength, impaired perceived functional ability, increased muscle spasms, impaired flexibility, impaired UE functional use, improper body mechanics, postural dysfunction, and pain.   ACTIVITY LIMITATIONS: carrying, lifting, sleeping, and reach over head  PARTICIPATION LIMITATIONS: community activity and occupation  PERSONAL FACTORS: OA, HTN, Rt THA 10/08/2015, former smoker are also affecting patient's functional outcome.   REHAB POTENTIAL: Good  CLINICAL DECISION MAKING: Stable/uncomplicated  EVALUATION COMPLEXITY: Low   GOALS: Goals reviewed with patient? Yes  SHORT TERM GOALS: Target date: 02/03/2024  Devanie will be independent with her day 1 home exercise program Baseline: Started 01/06/2024 Goal status: Met 01/31/2024  2.  Improve cervical active range of motion for extension to 65; lateral bending to 25 and rotation to 50 degrees Baseline: 60; 20/30 and 25/30 Goal status: Partially met 01/31/2024  3.  Improve cervical extension strength to at least 25 pounds Baseline: 16.5 pounds Goal status: Met 01/31/2024   LONG TERM GOALS: Target date: 03/02/2024   Improve patient specific functional score to at least 5 Baseline: 0.5 Goal status: INITIAL  2.  Karstyn will report neck and right upper  quadrant pain as consistently 2-4/10 or better on the visual analog scale Baseline: 4-7/10 Goal status: Ongoing 01/31/2024  3.  Improve cervical rotation active range of motion to 60 degrees Baseline: 25/30 degrees Goal status: Ongoing 01/31/2024  4.  Improve cervical extension strength to at least 30 pounds and lateral bending to at least 18 pounds Baseline: 16.5 pounds and 14.1/8.4 pounds respectively Goal status: Ongoing 01/31/2024  5.  Jona will be independent with a long-term maintenance home exercise program at discharge Baseline: 01/06/2024 Goal status: INITIAL   PLAN:  PT FREQUENCY: 1-2x/week  PT DURATION: 8 weeks  PLANNED INTERVENTIONS: 97110-Therapeutic exercises, 97530- Therapeutic activity, 97112- Neuromuscular re-education, 97535- Self Care, [id number]- Manual therapy, 97012- Traction (mechanical), [phone] (1-2 muscles), 20561 (3+ muscles)- Dry Needling, Patient/Family education, Spinal mobilization, Cryotherapy,  and Moist heat  PLAN FOR NEXT SESSION: Review HEP if needed and ask if walking initiated.  Burnard Meth, PT 02/16/24  3:17 PM

## 2024-02-24 ENCOUNTER — Encounter: Admitting: Rehabilitative and Restorative Service Providers"

## 2024-03-01 ENCOUNTER — Encounter: Admitting: Rehabilitative and Restorative Service Providers"

## 2024-03-07 NOTE — Therapy (Addendum)
 OUTPATIENT PHYSICAL THERAPY CERVICAL TREATMENT/RECERTIFICATION/DC   Patient Name: Martha Manning MRN: [id number] DOB:08/04/1955, 68 y.o., female Today's Date: 01/17/24  END OF SESSION:  PT End of Session - 03/09/24 0737     Visit Number 8    Number of Visits 16    Date for PT Re-Evaluation 05/03/24    Authorization Type BC[employer]    PT Start Time 1600    PT Stop Time 1640    PT Time Calculation (min) 40 min    Activity Tolerance Patient tolerated treatment well    Behavior During Therapy WFL for tasks assessed/performed                 Past Medical History:  Diagnosis Date   Allergy    Arthritis    right hip (10/08/2015)   GERD (gastroesophageal reflux disease)    depends on diet    Hypertension    Past Surgical History:  Procedure Laterality Date   DENTAL SURGERY     TOTAL HIP ARTHROPLASTY Right 10/08/2015   Procedure: RIGHT TOTAL HIP ARTHROPLASTY ANTERIOR APPROACH;  Surgeon: Lonni CINDERELLA Poli, MD;  Location: [facility] OR;  Service: Orthopedics;  Laterality: Right;   VAGINAL DELIVERY     41 yrs ago   Patient Active Problem List   Diagnosis Date Noted   Neck pain 12/22/2022   Former smoker 08/20/2022   Influenza vaccine refused 06/07/2020   Overweight (BMI 25.0-29.9) 06/07/2020   Obesity (BMI 30-39.9) 05/04/2019   Mixed hyperlipidemia 05/04/2019   Sinus congestion 05/04/2019   Gastroesophageal reflux disease without esophagitis 07/26/2018   Diabetes mellitus screening 12/21/2016   Status post total replacement of right hip 10/08/2015   Ganglion of left wrist 05/16/2015   Essential hypertension 03/14/2014   Osteoarthritis of right hip 03/14/2014   Smoking 03/14/2014    PCP: Barnie WENDI Louder, MD  REFERRING PROVIDER: Ronal Dragon Persons, PA  REFERRING DIAG: M54.2 (ICD-10-CM) - Pain in neck  THERAPY DIAG:  Abnormal posture  Muscle weakness (generalized)  Cervicalgia  Rationale for Evaluation and Treatment: Rehabilitation  ONSET DATE: Chronic,  about a year  SUBJECTIVE:                                                                                                                                                                                                         SUBJECTIVE STATEMENT:  Pt reports being away on vacation and has fluctuating tightness in the neck and UT area.  Eval:  Andre notes Right upper shoulder/trap pain of a year duration.   She is very affected  by weather.  Spasm and pain at all times in now the norm.  She is unable to sleep on her right side and feels as if she is far more disabled than she should be.    PERTINENT HISTORY:  OA, HTN, Rt THA 10/08/2015, former smoker  PAIN:  Are you having pain? Yes: NPRS scale: Today 5/10  Pain location: Rt upper trap and cervical spine Pain description: Ache, constant throb, spasm Aggravating factors: Lie on the right side, weather, checking mirrors in the car, prolonged sitting Relieving factors: Cortisone shot  PRECAUTIONS: Cervical  RED FLAGS: None     WEIGHT BEARING RESTRICTIONS: No  FALLS:  Has patient fallen in last 6 months? No  LIVING ENVIRONMENT: Lives with: lives alone Lives in: House/apartment Stairs: No issues with stairs Has following equipment at home: None  OCCUPATION: Works as a runner for an chief operating officer  PLOF: Independent  PATIENT GOALS: Get to workouts, drive without pain, dance, functional progress.  NEXT MD VISIT: 01/25/2024  OBJECTIVE:  Note: Objective measures were completed at Evaluation unless otherwise noted.  DIAGNOSTIC FINDINGS:  2 views of her cervical spine demonstrate straightening of the normal  lordosis.  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  Cannot appreciate any acute fractures  PATIENT SURVEYS:  PSFS: THE PATIENT SPECIFIC FUNCTIONAL SCALE  Place score of 0-10 (0 = unable to  perform activity and 10 = able to perform activity at the same level as before injury or problem)  Activity Date: 01/06/2024 Date: 03/08/24   Check mirrors when driving 8/89 2   2.  Quick cervical active range of motion movements 0/10 1   3.  Reaching or overhead function 1/10 3   4.  Sleeping on the right side 0/10 1   Total Score 0.5 1.75     Total Score = Sum of activity scores/number of activities  Minimally Detectable Change: 3 points (for single activity); 2 points (for average score)  [facility] (nd). The Patient Specific Functional Scale . Retrieved from [email or web address]   COGNITION: Overall cognitive status: Within functional limits for tasks assessed  SENSATION: WFL  POSTURE: rounded shoulders, forward head, and decreased lumbar lordosis   CERVICAL ROM:   Active ROM A/PROM (deg) eval AROM 01/31/2024  Flexion    Extension 60 70  Right lateral flexion 30 30  Left lateral flexion 20 25  Right rotation 30 30  Left rotation 25 35   (Blank rows = not tested)  UPPER EXTREMITY ROM:  Active ROM Right eval Left eval  Shoulder flexion    Shoulder extension    Shoulder abduction    Shoulder adduction    Shoulder extension    Shoulder internal rotation    Shoulder external rotation    Elbow flexion    Elbow extension    Wrist flexion    Wrist extension    Wrist ulnar deviation    Wrist radial deviation    Wrist pronation    Wrist supination     (Blank rows = not tested)  UPPER EXTREMITY STRENGTH:  In pounds assessed with hand-held dynamometer Left/Right 01/06/2024 Left/Right 01/31/2024  Shoulder flexion    Shoulder extension    Shoulder abduction    Shoulder adduction    Shoulder extension    Shoulder internal rotation    Shoulder external rotation    Middle trapezius    Lower  trapezius    Elbow flexion    Elbow extension    Wrist flexion    Wrist extension    Wrist ulnar deviation     Wrist radial deviation    Wrist pronation    Wrist supination    Grip strength    Cervical Extension 16.5 (Goal 30) 27.2 pounds  Cervical Lateral Bending 14.1/8.4 (Goal 18) 13.9/10.5 pounds   (Blank rows = not tested)   TREATMENT DATE:  03/08/24 Neuro Re Ed for posture Tband ER bilateral Red 3x10 Tband Rows Green 3x10 Tband Ext Green 3x10 There ex for rom/strengthening Supine cervical retractions Cervical rotations Manual - PROM, UT stretching, SOR, STM to pvm and UT    02/16/24 Full review of HEP Functional Act for posture Tband ER bilateral Red 3x10 Tband Rows Green 3x10 Tband Ext Green at [place]  There Ex  Supine cervical retractions Cervical rotations  Manual - PROM, UT stretching, SOR, STM to pvm and UT   01/31/2024 UBE Pull only Level 4 :30 Pull/:30 rest for 5 minutes, 50 RPM goal Scapular retraction/shoulder blade pinches 10 x 5 seconds Scapular retraction/shoulder blade pinches with shoulder shrug 10 x 5 seconds Cervical rotation active range of motion with shoulders pulled back 10 x 5 seconds Cervical extension isometrics 10 x 5 seconds     Cervical lateral bending isometrics 5 x 5 seconds bilateral  Functional Activities (for postural correction):                               Green Thera-Band Rows/Pull to chest with scapular retraction 20 x 3 seconds Thera-band Shoulder ER Yellow with scapular retraction 10 x 3 seconds bilateral Thera-Band Extension Palms up Yellow 10 x 30 seconds  [id number]: Reviewed objective numbers from evaluation and compared them to today's reassessment; reviewed changes to her home exercise program  Ice cervical spine 10 minutes   01/28/24  UBE x5 min backwards for posture  Supine UT stretch 2x30 seconds B Suboccipital release 4x30 seconds, gave tennis balls for home use Chin tucks 12x3 seconds  Chin tucks + rotation x10 B Chin tucks + lateral flexion x10 B Scap retractions green TB x15  Shoulder extensions green TB x15     01/26/24  [id number] There ex:  Backward shoulder rolls 2x10 UBE 3 min backwards for posture Supine AROM on pillow 20x Neuro Re Ed: Scapular retraction/shoulder blade pinches 10 x 5 seconds followed by Tband rows Green 3x10 Tband extension green 3x10 Supine cervical retractions 2x10 hold 4 sec Manual treatment: SOR Manual traction with UT stretching Soft tissue mobilization  PATIENT EDUCATION:  Education details: See above Person educated: Patient Education method: Explanation, Demonstration, Tactile cues, Verbal cues, and Handouts Education comprehension: verbalized understanding, returned demonstration, verbal cues required, tactile cues required, and needs further education  HOME EXERCISE PROGRAM: Access Code: [email or web address] URL: [email or web address] Date: 01/31/2024 Prepared by: Lamar Ivory  Exercises - Standing Scapular Retraction  - 5 x daily - 7 x weekly - 1 sets - 5 reps - 5 second hold - Seated Cervical Rotation AROM  - 3-5 x daily - 7 x weekly - 1 sets - 10 reps - 5 seconds hold - Standing Isometric Cervical Extension with Manual Resistance  - 5 x daily - 7 x weekly - 1 sets - 5 reps - 5 hold - Standing Isometric Cervical Sidebending with Manual Resistance  - 3-5 x daily - 7 x weekly - 1 sets - 10  reps - 5 hold - Standing Shoulder Row with Anchored Resistance  - 1 x daily - 7 x weekly - 1 sets - 20 reps - 3 seconds hold - Shoulder External Rotation with Anchored Resistance  - 1 x daily - 7 x weekly - 1 sets - 10 reps - 3 hold Cervical rotation active range of motion with shoulders pulled back 10 x 5 seconds Cervical extension isometrics 10 x 5 seconds                      ASSESSMENT:  CLINICAL IMPRESSION: Pt has been in PT for 8 visits with some inconsistency in appointment scheduling.  She has improved in her PSFS score and pain control but still presents with restricted motion and muscle spasms.  She would benefit from continued skilled PT to  normalize these deficits. OBJECTIVE IMPAIRMENTS: decreased activity tolerance, decreased endurance, decreased knowledge of condition, decreased ROM, decreased strength, impaired perceived functional ability, increased muscle spasms, impaired flexibility, impaired UE functional use, improper body mechanics, postural dysfunction, and pain.   ACTIVITY LIMITATIONS: carrying, lifting, sleeping, and reach over head  PARTICIPATION LIMITATIONS: community activity and occupation  PERSONAL FACTORS: OA, HTN, Rt THA 10/08/2015, former smoker are also affecting patient's functional outcome.   REHAB POTENTIAL: Good  CLINICAL DECISION MAKING: Stable/uncomplicated  EVALUATION COMPLEXITY: Low   GOALS: Goals reviewed with patient? Yes  SHORT TERM GOALS: Target date: 02/03/2024  Dulcie will be independent with her day 1 home exercise program Baseline: Started 01/06/2024 Goal status: Met 01/31/2024  2.  Improve cervical active range of motion for extension to 65; lateral bending to 25 and rotation to 50 degrees Baseline: 60; 20/30 and 25/30 Goal status: Partially met 01/31/2024  3.  Improve cervical extension strength to at least 25 pounds Baseline: 16.5 pounds Goal status: Met 01/31/2024   LONG TERM GOALS: Target date: 05/03/24  Improve patient specific functional score to at least 5 Baseline: 0.5 Goal status: ongoing 03/08/24  2.  Hiral will report neck and right upper quadrant pain as consistently 2-4/10 or better on the visual analog scale Baseline: 4-7/10 Goal status: ongoing 03/08/24  3.  Improve cervical rotation active range of motion to 60 degrees Baseline: 25/30 degrees Goal status: ongoing 03/08/24  4.  Improve cervical extension strength to at least 30 pounds and lateral bending to at least 18 pounds Baseline: 16.5 pounds and 14.1/8.4 pounds respectively Goal status: ongoing 03/08/24  5.  Henli will be independent with a long-term maintenance home exercise program at  discharge Baseline: 01/06/2024 Goal status: ongoing 03/08/24   PLAN:  PT FREQUENCY: 1-2x/week  PT DURATION: 8  PLANNED INTERVENTIONS: 97110-Therapeutic exercises, 97530- Therapeutic activity, 97112- Neuromuscular re-education, 97535- Self Care, 02859- Manual therapy, 97012- Traction (mechanical), [phone] (1-2 muscles), 20561 (3+ muscles)- Dry Needling, Patient/Family education, Spinal mobilization, Cryotherapy, and Moist heat  PLAN FOR NEXT SESSION: Continue stressing ROM goals  PHYSICAL THERAPY DISCHARGE SUMMARY  Visits from Start of Care: 8  Current functional level related to goals / functional outcomes: See above   Remaining deficits: See above   Education / Equipment: HEP   Patient agrees to discharge. Patient goals were partially met. Patient is being discharged due to not returning since the last visit.  Burnard Meth, PT 03/09/24  7:56 AM

## 2024-03-08 ENCOUNTER — Ambulatory Visit (INDEPENDENT_AMBULATORY_CARE_PROVIDER_SITE_OTHER)

## 2024-03-08 DIAGNOSIS — M542 Cervicalgia: Secondary | ICD-10-CM

## 2024-03-08 DIAGNOSIS — M6281 Muscle weakness (generalized): Secondary | ICD-10-CM | POA: Diagnosis not present

## 2024-03-08 DIAGNOSIS — R293 Abnormal posture: Secondary | ICD-10-CM | POA: Diagnosis not present

## 2024-03-09 ENCOUNTER — Telehealth (HOSPITAL_BASED_OUTPATIENT_CLINIC_OR_DEPARTMENT_OTHER): Payer: Self-pay

## 2024-03-28 ENCOUNTER — Ambulatory Visit: Admitting: Physician Assistant

## 2024-03-28 DIAGNOSIS — M5412 Radiculopathy, cervical region: Secondary | ICD-10-CM

## 2024-03-28 NOTE — Progress Notes (Addendum)
 Office Visit Note   Patient: Martha Manning           Date of Birth: 23-Apr-1956           MRN: [id number] Visit Date: 03/28/2024              Requested by: Vicci Barnie NOVAK, MD [address] [place],  [place] [address] PCP: Vicci Barnie NOVAK, MD      HPI: Patient is a 68 year old woman who comes in today requesting an IM injection of steroid for her neck.  She has a long history of neck pain.  At her last visit I referred her to physical therapy and she says her motion has improved but she has pain in her neck.  Requesting anotherinjection.  Assessment & Plan: Visit Diagnoses:  1. Cervical radiculopathy     Plan: I reviewed with the patient that I do understand that she is gotten good relief with the injections.  At this will be her final injection we will give her Toradol   30 mg I am going to order a MRI of her cervical spine as she has significant degenerative changes on previous x-rays.  Will then have her follow-up with [facility]  Follow-Up Instructions: With Megan after MRI  Ortho Exam  Patient is alert, oriented, no adenopathy, well-dressed, normal affect, normal respiratory effort. Examination she has stiffness with flexion and extension side-to-side turning strength is at her baseline very similar to previous exam    Imaging: No results found. No images are attached to the encounter.  Labs: Lab Results  Component Value Date   HGBA1C 5.4 12/21/2016   HGBA1C 5.9 (H) 03/14/2014     Lab Results  Component Value Date   ALBUMIN 4.4 04/22/2023   ALBUMIN 4.3 05/06/2022   ALBUMIN 4.4 05/20/2021    No results found for: MG Lab Results  Component Value Date   VD25OH 34.7 04/10/2021   VD25OH 59 03/14/2014    No results found for: PREALBUMIN    Latest Ref Rng & Units 04/22/2023    9:21 AM 05/06/2022    8:45 AM 10/07/2020    4:15 PM  CBC EXTENDED  WBC 3.4 - 10.8 x10E3/uL 7.6  8.8  6.8   RBC 3.77 - 5.28 x10E6/uL 4.62  4.75  4.67    Hemoglobin 11.1 - 15.9 g/dL 87.9  87.3  87.9   HCT 34.0 - 46.6 % 38.9  39.4  38.7   Platelets 150 - 450 x10E3/uL 385  361  366      There is no height or weight on file to calculate BMI.  Orders:  No orders of the defined types were placed in this encounter.  No orders of the defined types were placed in this encounter.    Procedures: No procedures performed  Clinical Data: No additional findings.  ROS:  All other systems negative, except as noted in the HPI. Review of Systems  Objective: Vital Signs: There were no vitals taken for this visit.  Specialty Comments:  No specialty comments available.  PMFS History: Patient Active Problem List   Diagnosis Date Noted   Cervical radiculopathy 03/28/2024   Neck pain 12/22/2022   Former smoker 08/20/2022   Influenza vaccine refused 06/07/2020   Overweight (BMI 25.0-29.9) 06/07/2020   Obesity (BMI 30-39.9) 05/04/2019   Mixed hyperlipidemia 05/04/2019   Sinus congestion 05/04/2019   Gastroesophageal reflux disease without esophagitis 07/26/2018   Diabetes mellitus screening 12/21/2016   Status post total replacement of  right hip 10/08/2015   Ganglion of left wrist 05/16/2015   Essential hypertension 03/14/2014   Osteoarthritis of right hip 03/14/2014   Smoking 03/14/2014   Past Medical History:  Diagnosis Date   Allergy    Arthritis    right hip (10/08/2015)   GERD (gastroesophageal reflux disease)    depends on diet    Hypertension     Family History  Problem Relation Age of Onset   Hypertension Mother    Stroke Mother    Hypertension Sister    Colon cancer Neg Hx    Esophageal cancer Neg Hx    Rectal cancer Neg Hx    Stomach cancer Neg Hx    Pancreatic cancer Neg Hx    Prostate cancer Neg Hx     Past Surgical History:  Procedure Laterality Date   DENTAL SURGERY     TOTAL HIP ARTHROPLASTY Right 10/08/2015   Procedure: RIGHT TOTAL HIP ARTHROPLASTY ANTERIOR APPROACH;  Surgeon: Lonni CINDERELLA Poli,  MD;  Location: [facility] OR;  Service: Orthopedics;  Laterality: Right;   VAGINAL DELIVERY     41 yrs ago   Social History   Occupational History   Not on file  Tobacco Use   Smoking status: Former    Current packs/day: 0.00    Types: Cigarettes    Quit date: 06/29/2022    Years since quitting: 1.7   Smokeless tobacco: Former   Tobacco comments:    7 cigs a day   Substance and Sexual Activity   Alcohol use: Yes    Alcohol/week: 7.0 standard drinks of alcohol    Types: 7 Glasses of wine per week    Comment: 7 glasses of wine per week, one per day   Drug use: No   Sexual activity: Not Currently

## 2024-04-24 ENCOUNTER — Other Ambulatory Visit: Payer: Self-pay | Admitting: Internal Medicine

## 2024-04-24 DIAGNOSIS — M62838 Other muscle spasm: Secondary | ICD-10-CM

## 2024-05-01 ENCOUNTER — Encounter: Payer: Self-pay | Admitting: Radiology

## 2024-05-02 ENCOUNTER — Ambulatory Visit: Admitting: Internal Medicine

## 2024-05-02 ENCOUNTER — Ambulatory Visit: Payer: Self-pay

## 2024-05-02 NOTE — Telephone Encounter (Signed)
 Please see below - request sent to office for assistance   Copied from CRM [id number]. Topic: General - Other >> May 02, 2024  7:54 AM Martha Manning wrote: Reason for CRM: pt wanted to cancel appt for today for 4:10 please call to get this rescheduled.

## 2024-06-06 ENCOUNTER — Ambulatory Visit: Attending: Internal Medicine | Admitting: Internal Medicine

## 2024-06-06 VITALS — BP 119/78 | HR 77 | Ht <= 58 in | Wt 133.8 lb

## 2024-06-06 DIAGNOSIS — Z6827 Body mass index (BMI) 27.0-27.9, adult: Secondary | ICD-10-CM | POA: Diagnosis not present

## 2024-06-06 DIAGNOSIS — I10 Essential (primary) hypertension: Secondary | ICD-10-CM | POA: Diagnosis not present

## 2024-06-06 DIAGNOSIS — Z2821 Immunization not carried out because of patient refusal: Secondary | ICD-10-CM | POA: Diagnosis not present

## 2024-06-06 DIAGNOSIS — F5104 Psychophysiologic insomnia: Secondary | ICD-10-CM | POA: Diagnosis not present

## 2024-06-06 DIAGNOSIS — M5412 Radiculopathy, cervical region: Secondary | ICD-10-CM | POA: Diagnosis not present

## 2024-06-06 DIAGNOSIS — E663 Overweight: Secondary | ICD-10-CM

## 2024-06-06 MED ORDER — AMLODIPINE BESYLATE 5 MG PO TABS: ORAL_TABLET | ORAL | 1 refills | Status: AC

## 2024-06-06 MED ORDER — LISINOPRIL 20 MG PO TABS: 20.0000 mg | ORAL_TABLET | Freq: Every day | ORAL | 1 refills | Status: AC

## 2024-06-06 NOTE — Patient Instructions (Signed)
  VISIT SUMMARY: Today, we discussed your ongoing neck pain, blood pressure management, and other health concerns. We reviewed your current medications and made plans for further evaluations and treatments.  YOUR PLAN: -CERVICAL RADICULOPATHY WITH CHRONIC NECK PAIN: This condition involves nerve irritation in the neck, causing pain that radiates to your shoulder. Despite physical therapy and previous steroid injections, your pain persists. We have contacted your orthopedic provider for follow-up and MRI scheduling, and referred you to pain management for further evaluation.  -ESSENTIAL HYPERTENSION: Your blood pressure is well-controlled with your current medication regimen. Continue taking your medications as prescribed: lidocaine  5 mg and lisinopril  20 mg daily.  -OVERWEIGHT: Your weight gain may be due to decreased water intake and reduced physical activity from arthritis pain. We encourage you to increase your water intake, possibly at room temperature, and discussed how arthritis impacts your physical activity and weight management.  -CHRONIC INSOMNIA: You are managing your sleep well with Ambien . Continue taking Ambien  as prescribed.  -GENERAL HEALTH MAINTENANCE: You declined flu, shingles, and tetanus vaccines today. We have ordered blood tests to check your kidney and liver function, and your blood cell count. The bone density study will be deferred until your dental needs are addressed.  INSTRUCTIONS: Please follow up with the orthopedic provider for your MRI and further evaluation. We have also referred you to [facility] pain management for additional interventions. Continue taking your medications as prescribed and increase your water intake. We will notify you with the results of your blood tests once they are available.                      Contains text generated by Abridge.                                 Contains text generated by  Abridge.

## 2024-06-06 NOTE — Progress Notes (Unsigned)
 Patient ID: Martha Manning, female    DOB: 18-Feb-1956  MRN: [id number]  CC: Hypertension (HTN f/u. /No question /No for all vac)   Subjective: Martha Manning is a 68 y.o. female who presents for chronic ds management. Her concerns today include:  history of HTN, HL (declined statin) tob dependence, obesity, OA of right hip status post THR, insomnia   Discussed the use of AI scribe software for clinical note transcription with the patient, who gave verbal consent to proceed.  History of Present Illness     Patient Active Problem List   Diagnosis Date Noted   Cervical radiculopathy 03/28/2024   Neck pain 12/22/2022   Former smoker 08/20/2022   Influenza vaccine refused 06/07/2020   Overweight (BMI 25.0-29.9) 06/07/2020   Obesity (BMI 30-39.9) 05/04/2019   Mixed hyperlipidemia 05/04/2019   Sinus congestion 05/04/2019   Gastroesophageal reflux disease without esophagitis 07/26/2018   Diabetes mellitus screening 12/21/2016   Status post total replacement of right hip 10/08/2015   Ganglion of left wrist 05/16/2015   Essential hypertension 03/14/2014   Osteoarthritis of right hip 03/14/2014   Smoking 03/14/2014     Current Outpatient Medications on File Prior to Visit  Medication Sig Dispense Refill   amLODipine  (NORVASC ) 5 MG tablet TAKE 1 TABLET BY MOUTH ONCE DAILY . 90 tablet 1   aspirin  81 MG chewable tablet Chew 81 mg by mouth daily.     fluticasone  (FLONASE ) 50 MCG/ACT nasal spray Place 1 spray into both nostrils daily as needed for allergies or rhinitis. 16 g 3   lisinopril  (ZESTRIL ) 20 MG tablet Take 1 tablet (20 mg total) by mouth daily. 90 tablet 1   loratadine  (CLARITIN ) 10 MG tablet Take 10 mg by mouth daily as needed for allergies or itching.     meloxicam  (MOBIC ) 15 MG tablet Take 1 tablet (15 mg total) by mouth daily. 30 tablet 5   methocarbamol  (ROBAXIN ) 500 MG tablet TAKE 1 TABLET BY MOUTH EVERY 6 HOURS AS NEEDED FOR MUSCLE SPASM 30 tablet 0   Multiple  Vitamin (MULTIVITAMIN WITH MINERALS) TABS tablet Take 1 tablet by mouth daily as needed (for supplementation). Reported on 12/02/2015     omeprazole  (PRILOSEC) 20 MG capsule Take 1 capsule by mouth once daily 30 capsule 0   zolpidem  (AMBIEN ) 5 MG tablet TAKE 1 TABLET BY MOUTH AT BEDTIME AS NEEDED FOR SLEEP 30 tablet 3   nicotine  (NICODERM CQ  - DOSED IN MG/24 HOURS) 14 mg/24hr patch Place 1 patch (14 mg total) onto the skin daily. (Patient not taking: Reported on 06/06/2024) 28 patch 1   traMADol  (ULTRAM ) 50 MG tablet Take 1 tablet (50 mg total) by mouth every 6 (six) hours as needed. (Patient not taking: Reported on 06/06/2024) 30 tablet 0   No current facility-administered medications on file prior to visit.    Allergies  Allergen Reactions   Carvedilol      Makes her feel winded when she walks upstairs.   Clindamycin/Lincomycin     Pt states she took this antibiotic once and broke out in a rash.    Codeine    Norvasc  [Amlodipine  Besylate] Swelling    Social History   Socioeconomic History   Marital status: Divorced    Spouse name: Not on file   Number of children: Not on file   Years of education: Not on file   Highest education level: 12th grade  Occupational History   Not on file  Tobacco Use   Smoking status:  Former    Current packs/day: 0.00    Types: Cigarettes    Quit date: 06/29/2022    Years since quitting: 1.9   Smokeless tobacco: Former   Tobacco comments:    7 cigs a day   Substance and Sexual Activity   Alcohol use: Yes    Alcohol/week: 7.0 standard drinks of alcohol    Types: 7 Glasses of wine per week    Comment: 7 glasses of wine per week, one per day   Drug use: No   Sexual activity: Not Currently  Other Topics Concern   Not on file  Social History Narrative   Not on file   Social Drivers of Health   Financial Resource Strain: High Risk (05/01/2024)   Overall Financial Resource Strain (CARDIA)    Difficulty of Paying Living Expenses: Hard  Food  Insecurity: No Food Insecurity (05/01/2024)   Hunger Vital Sign    Worried About Running Out of Food in the Last Year: Never true    Ran Out of Food in the Last Year: Never true  Transportation Needs: No Transportation Needs (05/01/2024)   PRAPARE - Administrator, Civil Service (Medical): No    Lack of Transportation (Non-Medical): No  Physical Activity: Unknown (05/01/2024)   Exercise Vital Sign    Days of Exercise per Week: Patient declined    Minutes of Exercise per Session: Not on file  Stress: Patient Declined (05/01/2024)   [employer] of Occupational Health - Occupational Stress Questionnaire    Feeling of Stress: Patient declined  Social Connections: Moderately Integrated (05/01/2024)   Social Connection and Isolation Panel    Frequency of Communication with Friends and Family: More than three times a week    Frequency of Social Gatherings with Friends and Family: Once a week    Attends Religious Services: More than 4 times per year    Active Member of Clubs or Organizations: Yes    Attends Banker Meetings: More than 4 times per year    Marital Status: Divorced  Intimate Partner Violence: Not At Risk (04/09/2023)   Humiliation, Afraid, Rape, and Kick questionnaire    Fear of Current or Ex-Partner: No    Emotionally Abused: No    Physically Abused: No    Sexually Abused: No    Family History  Problem Relation Age of Onset   Hypertension Mother    Stroke Mother    Hypertension Sister    Colon cancer Neg Hx    Esophageal cancer Neg Hx    Rectal cancer Neg Hx    Stomach cancer Neg Hx    Pancreatic cancer Neg Hx    Prostate cancer Neg Hx     Past Surgical History:  Procedure Laterality Date   DENTAL SURGERY     TOTAL HIP ARTHROPLASTY Right 10/08/2015   Procedure: RIGHT TOTAL HIP ARTHROPLASTY ANTERIOR APPROACH;  Surgeon: Lonni CINDERELLA Poli, MD;  Location: [facility] OR;  Service: Orthopedics;  Laterality: Right;   VAGINAL DELIVERY     41 yrs  ago    ROS: Review of Systems Negative except as stated above  PHYSICAL EXAM: BP 119/78   Pulse 77   Ht 4' 10 (1.473 m)   Wt 133 lb 12.8 oz (60.7 kg)   SpO2 99%   BMI 27.96 kg/m   Wt Readings from Last 3 Encounters:  06/06/24 133 lb 12.8 oz (60.7 kg)  12/23/23 125 lb (56.7 kg)  12/02/23 124 lb 6.4 oz (56.4 kg)  Physical Exam  General appearance - alert, well appearing, and in no distress Mental status - normal mood, behavior, speech, dress, motor activity, and thought processes Neck - supple, no significant adenopathy Chest - clear to auscultation, no wheezes, rales or rhonchi, symmetric air entry Heart - normal rate, regular rhythm, normal S1, S2, no murmurs, rubs, clicks or gallops Extremities - peripheral pulses normal, no pedal edema, no clubbing or cyanosis      Latest Ref Rng & Units 04/22/2023    9:21 AM 05/06/2022    8:45 AM 05/20/2021    3:49 PM  CMP  Glucose 70 - 99 mg/dL 82  89  87   BUN 8 - 27 mg/dL 8  9  11    Creatinine 0.57 - 1.00 mg/dL 9.30  9.33  9.21   Sodium 134 - 144 mmol/L 142  137  139   Potassium 3.5 - 5.2 mmol/L 4.3  4.1  4.4   Chloride 96 - 106 mmol/L 105  102  104   CO2 20 - 29 mmol/L 23  22  23    Calcium 8.7 - 10.3 mg/dL 89.8  89.7  89.6   Total Protein 6.0 - 8.5 g/dL 6.8  6.7  6.6   Total Bilirubin 0.0 - 1.2 mg/dL 0.3  0.3  <9.7   Alkaline Phos 44 - 121 IU/L 75  80  83   AST 0 - 40 IU/L 18  15  15    ALT 0 - 32 IU/L 13  12  13     Lipid Panel     Component Value Date/Time   CHOL 177 08/27/2023 1654   TRIG 117 08/27/2023 1654   HDL 64 08/27/2023 1654   CHOLHDL 2.8 08/27/2023 1654   CHOLHDL 3.1 03/14/2014 1102   VLDL 15 03/14/2014 1102   LDLCALC 92 08/27/2023 1654    CBC    Component Value Date/Time   WBC 7.6 04/22/2023 0921   WBC 9.9 10/10/2015 0520   RBC 4.62 04/22/2023 0921   RBC 3.28 (L) 10/10/2015 0520   HGB 12.0 04/22/2023 0921   HCT 38.9 04/22/2023 0921   PLT 385 04/22/2023 0921   MCV 84 04/22/2023 0921   MCH  26.0 (L) 04/22/2023 0921   MCH 25.0 (L) 10/10/2015 0520   MCHC 30.8 (L) 04/22/2023 0921   MCHC 31.5 10/10/2015 0520   RDW 14.7 04/22/2023 0921   LYMPHSABS CANCELED 06/07/2020 1623   MONOABS 0.9 03/14/2014 1102   EOSABS CANCELED 06/07/2020 1623   BASOSABS CANCELED 06/07/2020 1623    ASSESSMENT AND PLAN:  Assessment and Plan Assessment & Plan      There are no diagnoses linked to this encounter.   Patient was given the opportunity to ask questions.  Patient verbalized understanding of the plan and was able to repeat key elements of the plan.   This documentation was completed using Paediatric nurse.  Any transcriptional errors are unintentional.  No orders of the defined types were placed in this encounter.    Requested Prescriptions    No prescriptions requested or ordered in this encounter    No follow-ups on file.  Barnie Louder, MD, FACP

## 2024-06-07 ENCOUNTER — Encounter: Payer: Self-pay | Admitting: Internal Medicine

## 2024-06-07 ENCOUNTER — Other Ambulatory Visit: Payer: Self-pay | Admitting: Physician Assistant

## 2024-06-07 ENCOUNTER — Encounter: Payer: Self-pay | Admitting: Physician Assistant

## 2024-06-07 ENCOUNTER — Ambulatory Visit: Payer: Self-pay | Admitting: Internal Medicine

## 2024-06-07 DIAGNOSIS — M542 Cervicalgia: Secondary | ICD-10-CM

## 2024-06-07 LAB — CBC
Hematocrit: 38.2 % (ref 34.0–46.6)
Hemoglobin: 12 g/dL (ref 11.1–15.9)
MCH: 26.4 pg — ABNORMAL LOW (ref 26.6–33.0)
MCHC: 31.4 g/dL — ABNORMAL LOW (ref 31.5–35.7)
MCV: 84 fL (ref 79–97)
Platelets: 437 x10E3/uL (ref 150–450)
RBC: 4.54 x10E6/uL (ref 3.77–5.28)
RDW: 14.9 % (ref 11.7–15.4)
WBC: 8.8 x10E3/uL (ref 3.4–10.8)

## 2024-06-07 LAB — COMPREHENSIVE METABOLIC PANEL WITH GFR
ALT: 7 IU/L (ref 0–32)
AST: 14 IU/L (ref 0–40)
Albumin: 4.6 g/dL (ref 3.9–4.9)
Alkaline Phosphatase: 96 IU/L (ref 49–135)
BUN/Creatinine Ratio: 19 (ref 12–28)
BUN: 14 mg/dL (ref 8–27)
Bilirubin Total: <0.2 mg/dL (ref 0.0–1.2)
CO2: 21 mmol/L (ref 20–29)
Calcium: 10.5 mg/dL — ABNORMAL HIGH (ref 8.7–10.3)
Chloride: 104 mmol/L (ref 96–106)
Creatinine, Ser: 0.74 mg/dL (ref 0.57–1.00)
Globulin, Total: 2.4 g/dL (ref 1.5–4.5)
Glucose: 90 mg/dL (ref 70–99)
Potassium: 4.1 mmol/L (ref 3.5–5.2)
Sodium: 138 mmol/L (ref 134–144)
Total Protein: 7 g/dL (ref 6.0–8.5)
eGFR: 89 mL/min/1.73 (ref >59)

## 2024-06-12 ENCOUNTER — Encounter: Payer: Self-pay | Admitting: Physician Assistant

## 2024-06-18 ENCOUNTER — Telehealth: Payer: Self-pay | Admitting: Internal Medicine

## 2024-06-18 NOTE — Telephone Encounter (Signed)
-----   Message from [place] Persons, [place] sent at 06/07/2024 12:47 PM EST ----- Regarding: RE: Neck Pain I have to apologize, I had some staffing issues in September and looks like the re was not placed I will place it now. Duwaine Capek is our NP that exclusively sees backs ----- Message ----- From: Vicci Barnie NOVAK, MD Sent: 06/07/2024  10:10 AM EST To: Ronal Dragon Persons, PA Subject: Neck Pain                                      Good morning Ronal.  I am the PCP for this pt whom I saw in f/u yesterday. You have seen her in June and Sept for neck pain. Per your last note, you were ordering an MRI and then planned to get her in with Megan (I am not sure who Duwaine is). Pt has not received a call for either of these so I told her I would reach out to you about this. Thanks.

## 2024-06-25 ENCOUNTER — Other Ambulatory Visit: Payer: Self-pay | Admitting: Internal Medicine

## 2024-06-25 DIAGNOSIS — F5104 Psychophysiologic insomnia: Secondary | ICD-10-CM

## 2024-06-25 DIAGNOSIS — M62838 Other muscle spasm: Secondary | ICD-10-CM

## 2024-06-27 ENCOUNTER — Ambulatory Visit
Admission: RE | Admit: 2024-06-27 | Discharge: 2024-06-27 | Disposition: A | Discharge status: Home / Self Care | Source: Ambulatory Visit | Attending: Physician Assistant | Admitting: Physician Assistant

## 2024-06-27 DIAGNOSIS — M542 Cervicalgia: Secondary | ICD-10-CM

## 2024-07-05 ENCOUNTER — Telehealth: Payer: Self-pay | Admitting: Internal Medicine

## 2024-07-05 DIAGNOSIS — E041 Nontoxic single thyroid nodule: Secondary | ICD-10-CM

## 2024-07-05 NOTE — Telephone Encounter (Signed)
-----   Message from [facility] Persons, [place] sent at 07/05/2024  3:46 PM EST ----- Good afternoon Dr. Vicci.  I have been seeing Ms. Banka for her neck pain.  She just had an MRI and I am working on getting her into see our back specialist for perhaps an injection.  There was an incidental finding which is listed as the last result of a thyroid  nodule on the right and radiology recommended ultrasound evaluation.  This is obviously a bit out of my [place] so I am asking if you do not mind to take a look at the MRI and follow-up with her regarding this nodule.  Thank you so much have a great day

## 2024-07-05 NOTE — Telephone Encounter (Signed)
 Let patient know that the MRI of her cervical spine that was ordered by ortho showed an incidental finding of a spot/nodule on her right thyroid  gland, gland in the neck that controls the body's metabolism. The radiologist recommended that we order a thyroid  ultra sound to evaluate this further. I have submitted the order.

## 2024-07-06 NOTE — Telephone Encounter (Signed)
 Called & spoke to the patient. Verified name & DOB. Informed of results and recommendations. Informed that [facility] will reach out to her to schedule the appointment. Patient expressed verbal understanding of al discussed.

## 2024-07-07 ENCOUNTER — Encounter: Payer: Self-pay | Admitting: Physical Medicine & Rehabilitation

## 2024-07-10 ENCOUNTER — Other Ambulatory Visit: Payer: Self-pay | Admitting: Physician Assistant

## 2024-07-10 DIAGNOSIS — M5412 Radiculopathy, cervical region: Secondary | ICD-10-CM

## 2024-07-13 ENCOUNTER — Inpatient Hospital Stay: Admission: RE | Admit: 2024-07-13 | Discharge: 2024-07-13 | Attending: Internal Medicine | Admitting: Internal Medicine

## 2024-07-13 DIAGNOSIS — E041 Nontoxic single thyroid nodule: Secondary | ICD-10-CM

## 2024-07-14 ENCOUNTER — Ambulatory Visit: Payer: Self-pay | Admitting: Internal Medicine

## 2024-07-14 DIAGNOSIS — E041 Nontoxic single thyroid nodule: Secondary | ICD-10-CM

## 2024-07-14 NOTE — Telephone Encounter (Signed)
 Phone call placed to patient this afternoon to go over the results of thyroid  ultrasound.  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  One of them meets criteria for biopsy.  I went over with patient the ultrasound report and the recommendation for FNA of one of the nodules based on its size and appearance on the ultrasound.  She is agreeable to having this done.  However before getting the biopsy done, I told her I want her to come to the lab to have thyroid  hormone level checked to make sure this is not a hot/over functioning nodule.  She states she will come to the lab next week.  All questions were answered. Patient had question about the pain in her neck.  She recently had an MRI of the cervical spine done through orthopedics.  She states that the orthopedic PA has not contacted her about the results or the next steps.  I went over with her the results of that MRI and told her that I will send a message to the orthopedic PA requesting that she contact patient with next steps.

## 2024-07-15 ENCOUNTER — Encounter: Payer: Self-pay | Admitting: Physician Assistant

## 2024-07-20 ENCOUNTER — Encounter: Payer: Self-pay | Admitting: Physical Medicine and Rehabilitation

## 2024-07-20 ENCOUNTER — Ambulatory Visit: Admitting: Physical Medicine and Rehabilitation

## 2024-07-20 DIAGNOSIS — R9389 Abnormal findings on diagnostic imaging of other specified body structures: Secondary | ICD-10-CM

## 2024-07-20 DIAGNOSIS — M542 Cervicalgia: Secondary | ICD-10-CM

## 2024-07-20 DIAGNOSIS — M47812 Spondylosis without myelopathy or radiculopathy, cervical region: Secondary | ICD-10-CM

## 2024-07-20 MED ORDER — HYDROCODONE-ACETAMINOPHEN 5-325 MG PO TABS: 1.0000 | ORAL_TABLET | Freq: Three times a day (TID) | ORAL | 0 refills | Status: AC | PRN

## 2024-07-20 NOTE — Progress Notes (Signed)
 Pain Scale   Average Pain 4 Patient advising she has neck pain that increases when moving her head. Patient here for CT-scan review        +Driver, -BT, -Dye Allergies.

## 2024-07-20 NOTE — Progress Notes (Signed)
 "  Martha Manning - 69 y.o. female MRN [id number]  Date of birth: [birth year]  Office Visit Note: Visit Date: 07/20/2024 PCP: Vicci Barnie NOVAK, MD Referred by: Vicci Barnie NOVAK, MD  Subjective: Chief Complaint  Patient presents with   Neck - Pain   HPI: Martha Manning is a 69 y.o. female who comes in today per the request of Ronal Dragon Persons, PA for evaluation of chronic, worsening and severe right sided neck pain. Pain started in January of 2024 as more intermittent pain. Her pain has become more constant over the last year. She attributes worsening pain with cold weather. She is currently working as nature conservation officer and feel her job is exacerbating her pain. Her pain worsens with movement and activity. States she is unable to sleep on right side of neck. Side to side rotation also causes severe discomfort. She describes pain as sore, spasm and pinching sensation, currently rates as 8 out of 10. Some relief of pain with home exercise regimen, rest and use of medications. History of formal physical therapy with some relief of pain. Feels PT did help with range of motion. Recent cervical MRI imaging shows moderate multilevel cervical spondylosis without significant spinal stenosis. Degenerative osteoarthritic changes about the C1-2 articulations with associated reactive marrow edema, greater on the right. Finding could contribute to neck pain and referring right-sided symptoms. Also multi level foraminal narrowing noted.   There was abnormal finding on cervical MRI that states: Questionable focus of signal abnormality versus volume averaging involving the right upper cervical cord at the level of the cervicomedullary junction. Clinical correlation for possible myelopathic symptoms recommended. Correlation with dedicated brain MRI to include the cervicomedullary junction could be performed for further evaluation as warranted. Also 1.5 cm right thyroid  nodule. Further evaluation with dedicated thyroid   ultrasound recommended. She underwent thyroid  ultrasound on 07/13/2024. This is being managed by her PCP Dr. Barnie Vicci.                 Review of Systems  Musculoskeletal:  Positive for neck pain.  Neurological:  Negative for tingling, sensory change, focal weakness and weakness.  All other systems reviewed and are negative.  Otherwise per HPI.  Assessment & Plan: Visit Diagnoses:    ICD-10-CM   1. Cervicalgia  M54.2 Ambulatory referral to Physical Medicine Rehab    2. Facet arthropathy, cervical  M47.812 Ambulatory referral to Physical Medicine Rehab    3. Abnormal finding on imaging  R93.89 MR BRAIN WO CONTRAST    Ambulatory referral to Physical Medicine Rehab       Plan: Findings:  Chronic, worsening and severe right sided neck pain. No radicular symptoms down the arms. Patient continues to have severe pain despite good conservative therapies such as formal physical therapy, home exercise regimen, rest and use of medications. I discussed recent cervical MRI with her today using imaging and spine model. There is significant facet arthritis and marrow edema at the level of C1-C2, greater on the right. Also multi level foraminal narrowing. So high grade spinal canal stenosis noted. Patients clinical presentation and exam are consistent with facet mediated pain. She does have pain with side to side rotation today. We discussed treatment plan in detail today. Next step is to perform diagnostic and hopefully therapeutic right C2-C3 facet joint injection under fluoroscopic guidance. Could look at longer sustained pain relief with radiofrequency ablation. We are unable to perform injection at the level of C1-C2 due to anatomical position of vertebral arteries. I discussed  injection procedure in detail today, she has no questions at this time. I did call in short course of Norco for her while we are waiting to schedule injection. Her exam today is non focal, good strength noted to  bilateral upper extremities. No myelopathic symptoms noted.   We also discussed abnormal finding of possible cord signal abnormality to right upper cervical cord. I did go ahead and place MRI of the brain to rule out any structural abnormalities.     Meds & Orders:  Meds ordered this encounter  Medications   HYDROcodone -acetaminophen  (NORCO/VICODIN) 5-325 MG tablet    Sig: Take 1 tablet by mouth every 8 (eight) hours as needed.    Dispense:  20 tablet    Refill:  0    Orders Placed This Encounter  Procedures   MR BRAIN WO CONTRAST   Ambulatory referral to Physical Medicine Rehab    Follow-up: Return for Right C2-C3 facet joint injection.   Procedures: No procedures performed      Clinical History: Narrative & Impression CLINICAL DATA:  Initial evaluation for chronic neck pain, right shoulder pain.   EXAM: MRI CERVICAL SPINE WITHOUT CONTRAST   TECHNIQUE: Multiplanar, multisequence MR imaging of the cervical spine was performed. No intravenous contrast was administered.   COMPARISON:  Comparison made with prior radiograph from 12/22/2022.   FINDINGS: Alignment: Reversal of the normal cervical lordosis, apex at C5. Trace anterolisthesis of C3 on C4 and C4 on C5, with 2 mm retrolisthesis of C5 on C6. Underlying dextroscoliosis.   Vertebrae: Vertebral body height maintained without acute or chronic fracture. Bone marrow signal intensity diffusely heterogeneous without worrisome osseous lesion. Degenerative osteoarthritic changes present about the C1-2 articulations with reactive marrow edema, greater on the right (series 307, images 9, 5).   Cord: Questionable focus of signal abnormality versus volume averaging seen involving the right upper cervical cord at the level of the cervicomedullary junction (series 306, image 8). This area is not evaluated on axial views. Otherwise normal signal and morphology.   Posterior Fossa, vertebral arteries, paraspinal tissues:  Visualized brain and posterior fossa within normal limits. Paraspinous soft tissues within normal limits. Normal flow voids seen within the vertebral arteries bilaterally. Few scattered thyroid  nodules noted, largest of which measures 1.5 cm on the right (series 108, image 23).   Disc levels:   C1-2: Degenerative osteoarthritic changes about the atlantodental and right C1-2 articulations. No stenosis.   C2-C3: Normal interspace. Moderate left facet arthrosis. No significant spinal stenosis. Mild left C3 foraminal narrowing. Right neural foramina widely patent.   C3-C4: Degenerative disc space narrowing with diffuse disc osteophyte complex. Posterior component flattens and partially effaces the ventral thecal sac, asymmetric to the left. Moderate left facet arthrosis. No significant spinal stenosis. Severe left with mild-to-moderate right C4 foraminal stenosis.   C4-C5: Degenerative intervertebral disc space narrowing with diffuse disc osteophyte complex. Moderate left with mild right facet arthrosis. No spinal stenosis. Severe left with moderate right C5 foraminal stenosis.   C5-C6: Degenerative disc space narrowing with diffuse disc osteophyte complex. Mild to moderate left with mild right facet arthrosis. No significant spinal stenosis. Severe left with moderate right C6 foraminal narrowing.   C6-C7: Degenerate intervertebral disc space narrowing with diffuse disc osteophyte complex. Mild left-sided facet hypertrophy. No spinal stenosis. Moderate bilateral C7 foraminal narrowing.   C7-T1: Degenerative vertebral disc spacing with diffuse disc osteophyte complex. Moderate left facet arthrosis. No spinal stenosis. Mild right with moderate left C8 foraminal narrowing.   IMPRESSION: 1.  Moderate multilevel cervical spondylosis without significant spinal stenosis. 2. Degenerative osteoarthritic changes about the C1-2 articulations with associated reactive marrow edema, greater  on the right. Finding could contribute to neck pain and referring right-sided symptoms. 3. Multifactorial degenerative changes with resultant multilevel foraminal narrowing as above. Notable findings include severe left with moderate right C4 through C6 foraminal stenosis, with moderate bilateral C7 and left C8 foraminal narrowing. 4. Questionable focus of signal abnormality versus volume averaging involving the right upper cervical cord at the level of the cervicomedullary junction. Clinical correlation for possible myelopathic symptoms recommended. Correlation with dedicated brain MRI to include the cervicomedullary junction could be performed for further evaluation as warranted. 5. 1.5 cm right thyroid  nodule. Further evaluation with dedicated thyroid  ultrasound recommended.     Electronically Signed   By: Morene Hoard M.D.   On: 07/04/2024 13:18   She reports that she quit smoking about 2 years ago. Her smoking use included cigarettes. She has quit using smokeless tobacco. No results for input(s): HGBA1C, LABURIC in the last 8760 hours.  Objective:  VS:  HT:    WT:   BMI:     BP:   HR: bpm  TEMP: ( )  RESP:  Physical Exam Vitals and nursing note reviewed.  HENT:     Head: Normocephalic and atraumatic.     Right Ear: External ear normal.     Left Ear: External ear normal.     Nose: Nose normal.     Mouth/Throat:     Mouth: Mucous membranes are moist.  Eyes:     Extraocular Movements: Extraocular movements intact.  Cardiovascular:     Rate and Rhythm: Normal rate.     Pulses: Normal pulses.  Pulmonary:     Effort: Pulmonary effort is normal.  Abdominal:     General: Abdomen is flat. There is no distension.  Musculoskeletal:        General: Tenderness present.     Cervical back: Tenderness present.     Comments: Discomfort noted with side to side rotation, specifically right sided rotation. Patient has good strength in the upper extremities including 5  out of 5 strength in wrist extension, long finger flexion and APB. Shoulder range of motion is full bilaterally without any sign of impingement. There is no atrophy of the hands intrinsically. Sensation intact bilaterally. Negative Hoffman's sign. Negative Spurling's sign.     Skin:    General: Skin is warm and dry.     Capillary Refill: Capillary refill takes less than 2 seconds.  Neurological:     General: No focal deficit present.     Mental Status: She is alert and oriented to person, place, and time.  Psychiatric:        Mood and Affect: Mood normal.        Behavior: Behavior normal.     Ortho Exam  Imaging: No results found.  Past Medical/Family/Surgical/Social History: Medications & Allergies reviewed per EMR, new medications updated. Patient Active Problem List   Diagnosis Date Noted   Cervical radiculopathy 03/28/2024   Neck pain 12/22/2022   Former smoker 08/20/2022   Influenza vaccine refused 06/07/2020   Overweight (BMI 25.0-29.9) 06/07/2020   Obesity (BMI 30-39.9) 05/04/2019   Mixed hyperlipidemia 05/04/2019   Sinus congestion 05/04/2019   Gastroesophageal reflux disease without esophagitis 07/26/2018   Diabetes mellitus screening 12/21/2016   Status post total replacement of right hip 10/08/2015   Ganglion of left wrist 05/16/2015   Essential hypertension 03/14/2014  Osteoarthritis of right hip 03/14/2014   Smoking 03/14/2014   Past Medical History:  Diagnosis Date   Allergy    Arthritis    right hip (10/08/2015)   GERD (gastroesophageal reflux disease)    depends on diet    Hypertension    Family History  Problem Relation Age of Onset   Hypertension Mother    Stroke Mother    Hypertension Sister    Colon cancer Neg Hx    Esophageal cancer Neg Hx    Rectal cancer Neg Hx    Stomach cancer Neg Hx    Pancreatic cancer Neg Hx    Prostate cancer Neg Hx    Past Surgical History:  Procedure Laterality Date   DENTAL SURGERY     TOTAL HIP  ARTHROPLASTY Right 10/08/2015   Procedure: RIGHT TOTAL HIP ARTHROPLASTY ANTERIOR APPROACH;  Surgeon: Lonni CINDERELLA Poli, MD;  Location: [facility] OR;  Service: Orthopedics;  Laterality: Right;   VAGINAL DELIVERY     41 yrs ago   Social History   Occupational History   Not on file  Tobacco Use   Smoking status: Former    Current packs/day: 0.00    Types: Cigarettes    Quit date: 06/29/2022    Years since quitting: 2.0   Smokeless tobacco: Former   Tobacco comments:    7 cigs a day   Substance and Sexual Activity   Alcohol use: Yes    Alcohol/week: 7.0 standard drinks of alcohol    Types: 7 Glasses of wine per week    Comment: 7 glasses of wine per week, one per day   Drug use: No   Sexual activity: Not Currently    "

## 2024-07-24 ENCOUNTER — Telehealth: Payer: Self-pay

## 2024-07-24 NOTE — Telephone Encounter (Signed)
 Patient advising she had CT-scan review with Megan 07/20/24, and was told an order was being put in for an injection--no order is showing

## 2024-07-25 ENCOUNTER — Encounter: Payer: Self-pay | Admitting: Physical Medicine and Rehabilitation

## 2024-07-26 ENCOUNTER — Telehealth: Payer: Self-pay

## 2024-07-26 ENCOUNTER — Other Ambulatory Visit: Payer: Self-pay | Admitting: Physical Medicine and Rehabilitation

## 2024-07-26 MED ORDER — DIAZEPAM 5 MG PO TABS: ORAL_TABLET | ORAL | 0 refills | Status: AC

## 2024-07-26 NOTE — Telephone Encounter (Signed)
 Pre Procedural Valium 

## 2024-08-04 ENCOUNTER — Encounter: Admitting: Physical Medicine & Rehabilitation

## 2024-08-07 ENCOUNTER — Other Ambulatory Visit

## 2024-08-10 ENCOUNTER — Encounter: Admitting: Physical Medicine and Rehabilitation

## 2024-10-05 ENCOUNTER — Ambulatory Visit: Admitting: Internal Medicine
